# Patient Record
Sex: Male | Born: 1970
Health system: Southern US, Community
[De-identification: ages and names within clinical notes are randomized; demographics above are authoritative.]

## PROBLEM LIST (undated history)

## (undated) DIAGNOSIS — C801 Malignant (primary) neoplasm, unspecified: Secondary | ICD-10-CM

---

## 2016-06-29 DEATH — deceased

## 2016-09-08 ENCOUNTER — Telehealth: Payer: Self-pay | Admitting: Oncology

## 2016-09-08 ENCOUNTER — Encounter: Payer: Self-pay | Admitting: Oncology

## 2016-09-08 NOTE — Telephone Encounter (Signed)
Lft the pt a vm w/appt date and time to see Dr. Alen Blew on 5/22 at 11am. Will mail the pt a letter w/the appt date and time.

## 2016-09-19 ENCOUNTER — Ambulatory Visit (HOSPITAL_BASED_OUTPATIENT_CLINIC_OR_DEPARTMENT_OTHER): Payer: Self-pay | Admitting: Oncology

## 2016-09-19 ENCOUNTER — Other Ambulatory Visit: Payer: Self-pay | Admitting: *Deleted

## 2016-09-19 ENCOUNTER — Encounter: Payer: Self-pay | Admitting: *Deleted

## 2016-09-19 ENCOUNTER — Telehealth: Payer: Self-pay | Admitting: Oncology

## 2016-09-19 VITALS — BP 127/80 | HR 100 | Temp 98.0°F | Resp 18 | Ht 70.0 in | Wt 220.1 lb

## 2016-09-19 DIAGNOSIS — Z7189 Other specified counseling: Secondary | ICD-10-CM

## 2016-09-19 DIAGNOSIS — C469 Kaposi's sarcoma, unspecified: Secondary | ICD-10-CM | POA: Insufficient documentation

## 2016-09-19 DIAGNOSIS — R609 Edema, unspecified: Secondary | ICD-10-CM

## 2016-09-19 MED ORDER — PROCHLORPERAZINE MALEATE 10 MG PO TABS: 10.0000 mg | ORAL_TABLET | Freq: Four times a day (QID) | ORAL | 0 refills | Status: AC | PRN

## 2016-09-19 MED ORDER — PROCHLORPERAZINE MALEATE 10 MG PO TABS: 10.0000 mg | ORAL_TABLET | Freq: Four times a day (QID) | ORAL | 0 refills | Status: DC | PRN

## 2016-09-19 NOTE — Progress Notes (Signed)
START OFF PATHWAY REGIMEN - [Other Dx]   [id number]:Liposomal Doxorubicin (Doxil)  40 mg/m2 q28 Days:   A cycle is every 28 days:     Liposomal doxorubicin   **Always confirm dose/schedule in your pharmacy ordering system**    Intent of Therapy: Non-Curative / Palliative Intent, Discussed with Patient 

## 2016-09-19 NOTE — Telephone Encounter (Signed)
Called in compazine 10 mg # 30 to [facility] on [address]

## 2016-09-19 NOTE — Progress Notes (Signed)
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   HPI: 46 year old gentleman currently resides in [place] where he lives independently. He is a gentleman without any significant past medical history and does not seek medical attention periodically. He has been noticing a rash on his lower extremity for the last 8 months. The rash has progressed into his inner thigh, calf and subsequently to his chest. Liver. This time, he started developing lower extremity edema. He was urged by his family and friends to seek medical attention and finally did and was evaluated by his primary care physician. Based on these findings he was referred to [facility] dermatology and a biopsy obtained of his raised velvety regions on his chest and lower extremity. 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 This on these findings, he was evaluated by his primary care physician with laboratory testing in February 2018. His white cell count was 4.8, hemoglobin of 11.7 and a platelet count of 251. He is creatinine clearance was normal. Liver function tests all within normal range. His HIV testing was nonreactive and his RPR was not reactive. He was started on Lasix which have helped his symptoms at this time.  Despite these changes, he still able to drive although not able to ambulate long distances. He developed symptoms of shortness of breath and excessive fatigue after. Although embolization that is very short period his edema is still prominent and affects his quality of life. He still moves his bowel and urinate without any difficulties.  He does not report any headaches, blurry vision, syncope or seizures. He does not report any fevers, chills, sweats or weight loss. He does not report any chest pain, palpitation, orthopnea. He does not report any cough, wheezing or hemoptysis. He does not report any nausea, vomiting or abdominal pain. He does not report any change in his bowel habits. He does not report any  hematuria or dysuria. He does not report any skeletal complaints. Remaining review of systems unremarkable.   His past medical history is unremarkable for chronic medical conditions. He denied hypertension, diabetes or coronary disease. He denied any surgeries.    Current Outpatient Prescriptions:  .  furosemide (LASIX) 20 MG tablet, Take 20 mg by mouth daily., Disp: , Rfl: :  Allergies  Allergen Reactions  . Bee Venom Anaphylaxis  :  No family history of malignancy or blood disorders.   Social History   Social History  . Marital status: Single    Spouse name: N/A  . Number of children: N/A  . Years of education: N/A   Occupational History  . Not on file.   Social History Main Topics  . Smoking status: Not on file  . Smokeless tobacco: Not on file  . Alcohol use Not on file  . Drug use: Unknown  . Sexual activity: Not on file   Other Topics Concern  . Not on file   Social History Narrative  . No narrative on file  :  Pertinent items are noted in HPI.  Exam: Blood pressure 127/80, pulse 100, temperature 98 F (36.7 C), temperature source Oral, resp. rate 18, height 5' 10"  (1.778 m), weight 220 lb 1.6 oz (99.8 kg), SpO2 100 %. General appearance: alert and cooperative appeared without distress. Throat: Oral thrush or ulcers. Neck: no adenopathy Back: negative Resp: clear to auscultation bilaterally Cardio: regular rate and rhythm, S1, S2 normal, no murmur, click, rub or gallop GI: soft, non-tender; bowel sounds normal; no masses,  no organomegaly Extremities: 3+ edema to the  level of thigh.  Skin: Raised lesions noted on his chest, arms, trunk and lower action of any superior   Assessment and Plan:   46 year old gentleman with the following issues:  1. 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 He has involvement of the chest, trunk, upper extremities and lower extremities. He appears to have also lymphatic  involvement with diffuse anasarca noted on his lower extremities. He is HIV status is negative.  The natural course of disease was discussed today with the patient and his mother. He appears to have disseminated disease that will require systemic treatment rather than local therapy. Risks and benefits of Doxil chemotherapy was reviewed today. A dose of 20 mg/m will be the recommended dose every 3 weeks to control his disease locally and systemically. Complications associated with this medication include nausea, fatigue, hand-foot syndrome, anaphylaxis which is rare. Cardiac toxicity was also noted as well.   Would be to complete his staging with a CT scan chest abdomen and pelvis, attend chemotherapy education class and start therapy as soon as possible. The duration of chemotherapy would be unknown at this time depending on his benefit.  2. Cardiac monitoring: Baseline echocardiogram will be obtained in the immediate future.  3. IV access: Peripheral veins will be used for the time being. Port-A-Cath could be considered in the future.  4. Edema: This is related due to systemic involvement of Kaposi's sarcoma. Doxil should help the symptoms. I recommended continuing Lasix for the time being. This will be discontinued if his edema improved on Doxil alone.  5. Antiemetics: Prescription for Compazine will be: To the patient met this patient of start of chemotherapy.  6. Follow-up: Will be in the immediate future to start therapy as soon as possible.

## 2016-09-19 NOTE — Telephone Encounter (Signed)
Scheduled appts per 5/22 LOS - 5/29 unavailable scheduled for 5/31. Gave patient AVS and calender.

## 2016-09-20 ENCOUNTER — Encounter: Payer: Self-pay | Admitting: *Deleted

## 2016-09-20 ENCOUNTER — Other Ambulatory Visit: Payer: Self-pay

## 2016-09-26 ENCOUNTER — Encounter: Payer: Self-pay | Admitting: Oncology

## 2016-09-26 ENCOUNTER — Encounter: Payer: Self-pay | Admitting: Pharmacist

## 2016-09-26 NOTE — Progress Notes (Signed)
Called pt to introduce myself as his Arboriculturist and to discuss financial assistance.  Wasn't able to leave messages on either number that's on file.  Will try and meet pt on 09/28/16.

## 2016-09-27 ENCOUNTER — Ambulatory Visit (HOSPITAL_COMMUNITY)
Admission: RE | Admit: 2016-09-27 | Discharge: 2016-09-27 | Disposition: A | Discharge status: Home / Self Care | Payer: Self-pay | Source: Ambulatory Visit | Attending: Oncology | Admitting: Oncology

## 2016-09-27 ENCOUNTER — Encounter (HOSPITAL_COMMUNITY): Payer: Self-pay

## 2016-09-27 ENCOUNTER — Encounter: Payer: Self-pay | Admitting: Pharmacy Technician

## 2016-09-27 DIAGNOSIS — C469 Kaposi's sarcoma, unspecified: Secondary | ICD-10-CM | POA: Insufficient documentation

## 2016-09-27 DIAGNOSIS — R591 Generalized enlarged lymph nodes: Secondary | ICD-10-CM | POA: Insufficient documentation

## 2016-09-27 DIAGNOSIS — R161 Splenomegaly, not elsewhere classified: Secondary | ICD-10-CM | POA: Insufficient documentation

## 2016-09-27 DIAGNOSIS — R918 Other nonspecific abnormal finding of lung field: Secondary | ICD-10-CM | POA: Insufficient documentation

## 2016-09-27 DIAGNOSIS — R609 Edema, unspecified: Secondary | ICD-10-CM | POA: Insufficient documentation

## 2016-09-27 DIAGNOSIS — M899 Disorder of bone, unspecified: Secondary | ICD-10-CM | POA: Insufficient documentation

## 2016-09-27 DIAGNOSIS — K639 Disease of intestine, unspecified: Secondary | ICD-10-CM | POA: Insufficient documentation

## 2016-09-27 HISTORY — DX: Malignant (primary) neoplasm, unspecified: C80.1

## 2016-09-27 MED ORDER — IOPAMIDOL (ISOVUE-300) INJECTION 61%
INTRAVENOUS | Status: AC
Start: 2016-09-27 — End: 2016-09-27
  Administered 2016-09-27: 100 mL via INTRAVENOUS
  Filled 2016-09-27: qty 100

## 2016-09-27 MED ORDER — IOPAMIDOL (ISOVUE-300) INJECTION 61%
100.0000 mL | Freq: Once | INTRAVENOUS | Status: AC | PRN
  Administered 2016-09-27: 100 mL via INTRAVENOUS

## 2016-09-27 NOTE — Progress Notes (Signed)
I have attempted to request the patient to sign a drug assistance application for Doxil.  I left 2 messages on the phone numbers provided. I received no response. I contacted Lenise for more info. She left a note to see the patient before treatment on 09/28/16.

## 2016-09-28 ENCOUNTER — Other Ambulatory Visit (HOSPITAL_BASED_OUTPATIENT_CLINIC_OR_DEPARTMENT_OTHER): Payer: Self-pay

## 2016-09-28 ENCOUNTER — Encounter: Payer: Self-pay | Admitting: Oncology

## 2016-09-28 ENCOUNTER — Ambulatory Visit (HOSPITAL_BASED_OUTPATIENT_CLINIC_OR_DEPARTMENT_OTHER): Payer: Self-pay

## 2016-09-28 VITALS — BP 116/64 | HR 91 | Temp 97.7°F | Resp 18

## 2016-09-28 DIAGNOSIS — C469 Kaposi's sarcoma, unspecified: Secondary | ICD-10-CM

## 2016-09-28 DIAGNOSIS — Z5111 Encounter for antineoplastic chemotherapy: Secondary | ICD-10-CM

## 2016-09-28 LAB — COMPREHENSIVE METABOLIC PANEL
ALBUMIN: 3.2 g/dL — AB (ref 3.5–5.0)
ALK PHOS: 109 U/L (ref 40–150)
ALT: 11 U/L (ref 0–55)
ANION GAP: 7 meq/L (ref 3–11)
AST: 19 U/L (ref 5–34)
BILIRUBIN TOTAL: 0.27 mg/dL (ref 0.20–1.20)
BUN: 13.6 mg/dL (ref 7.0–26.0)
CALCIUM: 8.9 mg/dL (ref 8.4–10.4)
CO2: 22 mEq/L (ref 22–29)
CREATININE: 0.8 mg/dL (ref 0.7–1.3)
Chloride: 109 mEq/L (ref 98–109)
EGFR: >90 mL/min/{1.73_m2} (ref >90)
Glucose: 115 mg/dl (ref 70–140)
Potassium: 4.1 mEq/L (ref 3.5–5.1)
Sodium: 138 mEq/L (ref 136–145)
TOTAL PROTEIN: 7.2 g/dL (ref 6.4–8.3)

## 2016-09-28 LAB — CBC WITH DIFFERENTIAL/PLATELET
BASO%: 1.1 % (ref 0.0–2.0)
Basophils Absolute: 0 10*3/uL (ref 0.0–0.1)
EOS ABS: 0.2 10*3/uL (ref 0.0–0.5)
EOS%: 6.6 % (ref 0.0–7.0)
HEMATOCRIT: 31.2 % — AB (ref 38.4–49.9)
HGB: 10.2 g/dL — ABNORMAL LOW (ref 13.0–17.1)
LYMPH#: 1 10*3/uL (ref 0.9–3.3)
LYMPH%: 26.3 % (ref 14.0–49.0)
MCH: 26.4 pg — ABNORMAL LOW (ref 27.2–33.4)
MCHC: 32.8 g/dL (ref 32.0–36.0)
MCV: 80.5 fL (ref 79.3–98.0)
MONO#: 0.4 10*3/uL (ref 0.1–0.9)
MONO%: 11.2 % (ref 0.0–14.0)
NEUT%: 54.8 % (ref 39.0–75.0)
NEUTROS ABS: 2 10*3/uL (ref 1.5–6.5)
PLATELETS: 183 10*3/uL (ref 140–400)
RBC: 3.88 10*6/uL — ABNORMAL LOW (ref 4.20–5.82)
RDW: 16.2 % — ABNORMAL HIGH (ref 11.0–14.6)
WBC: 3.7 10*3/uL — AB (ref 4.0–10.3)

## 2016-09-28 MED ORDER — DEXAMETHASONE SODIUM PHOSPHATE 10 MG/ML IJ SOLN
10.0000 mg | Freq: Once | INTRAMUSCULAR | Status: AC
  Administered 2016-09-28: 10 mg via INTRAVENOUS

## 2016-09-28 MED ORDER — DOXORUBICIN HCL LIPOSOMAL CHEMO INJECTION 2 MG/ML
18.0000 mg/m2 | Freq: Once | INTRAVENOUS | Status: AC
  Administered 2016-09-28: 40 mg via INTRAVENOUS
  Filled 2016-09-28: qty 20

## 2016-09-28 MED ORDER — DEXTROSE 5 % IV SOLN
Freq: Once | INTRAVENOUS | Status: AC
  Administered 2016-09-28: 11:00:00 via INTRAVENOUS

## 2016-09-28 MED ORDER — DEXAMETHASONE SODIUM PHOSPHATE 10 MG/ML IJ SOLN
INTRAMUSCULAR | Status: AC
  Filled 2016-09-28: qty 1

## 2016-09-28 NOTE — Patient Instructions (Signed)
[facility] Discharge Instructions for Patients Receiving Chemotherapy  Today you received the following chemotherapy agent: Doxil.  To help prevent nausea and vomiting after your treatment, we encourage you to take your nausea medication as prescribed.   If you develop nausea and vomiting that is not controlled by your nausea medication, call the clinic.   BELOW ARE SYMPTOMS THAT SHOULD BE REPORTED IMMEDIATELY:  *FEVER GREATER THAN 100.5 F  *CHILLS WITH OR WITHOUT FEVER  NAUSEA AND VOMITING THAT IS NOT CONTROLLED WITH YOUR NAUSEA MEDICATION  *UNUSUAL SHORTNESS OF BREATH  *UNUSUAL BRUISING OR BLEEDING  TENDERNESS IN MOUTH AND THROAT WITH OR WITHOUT PRESENCE OF ULCERS  *URINARY PROBLEMS  *BOWEL PROBLEMS  UNUSUAL RASH Items with * indicate a potential emergency and should be followed up as soon as possible.  Feel free to call the clinic you have any questions or concerns. The clinic phone number is [phone].  Please show the [facility] at check-in to the Emergency Department and triage nurse.  Doxorubicin Liposomal injection (Doxil) What is this medicine? LIPOSOMAL DOXORUBICIN (LIP oh som al dox oh ROO bi sin) is a chemotherapy drug. This medicine is used to treat many kinds of cancer like Kaposi's sarcoma, multiple myeloma, and ovarian cancer. This medicine may be used for other purposes; ask your health care provider or pharmacist if you have questions. COMMON BRAND NAME(S): Doxil, Lipodox What should I tell my health care provider before I take this medicine? They need to know if you have any of these conditions: -blood disorders -heart disease -infection (especially a virus infection such as chickenpox, cold sores, or herpes) -liver disease -recent or ongoing radiation therapy -an unusual or allergic reaction to doxorubicin, other chemotherapy agents, soybeans, other medicines, foods, dyes, or preservatives -pregnant or trying to get  pregnant -breast-feeding How should I use this medicine? This drug is given as an infusion into a vein. It is administered in a hospital or clinic by a specially trained health care professional. If you have pain, swelling, burning or any unusual feeling around the site of your injection, tell your health care professional right away. Talk to your pediatrician regarding the use of this medicine in children. Special care may be needed. Overdosage: If you think you have taken too much of this medicine contact a poison control center or emergency room at once. NOTE: This medicine is only for you. Do not share this medicine with others. What if I miss a dose? It is important not to miss your dose. Call your doctor or health care professional if you are unable to keep an appointment. What may interact with this medicine? Do not take this medicine with any of the following medications: -zidovudine This medicine may also interact with the following medications: -medicines to increase blood counts like filgrastim, pegfilgrastim, sargramostim -vaccines Talk to your doctor or health care professional before taking any of these medicines: -acetaminophen -aspirin -ibuprofen -ketoprofen -naproxen This list may not describe all possible interactions. Give your health care provider a list of all the medicines, herbs, non-prescription drugs, or dietary supplements you use. Also tell them if you smoke, drink alcohol, or use illegal drugs. Some items may interact with your medicine. What should I watch for while using this medicine? Your condition will be monitored carefully while you are receiving this medicine. You will need important blood work done while you are taking this medicine. This drug may make you feel generally unwell. This is not uncommon, as chemotherapy can  affect healthy cells as well as cancer cells. Report any side effects. Continue your course of treatment even though you feel ill unless  your doctor tells you to stop. Your urine may turn orange-red for a few days after your dose. This is not blood. If your urine is dark or brown, call your doctor. In some cases, you may be given additional medicines to help with side effects. Follow all directions for their use. Call your doctor or health care professional for advice if you get a fever (100.5 degrees F or higher), chills or sore throat, or other symptoms of a cold or flu. Do not treat yourself. This drug decreases your body's ability to fight infections. Try to avoid being around people who are sick. This medicine may increase your risk to bruise or bleed. Call your doctor or health care professional if you notice any unusual bleeding. Be careful brushing and flossing your teeth or using a toothpick because you may get an infection or bleed more easily. If you have any dental work done, tell your dentist you are receiving this medicine. Avoid taking products that contain aspirin, acetaminophen, ibuprofen, naproxen, or ketoprofen unless instructed by your doctor. These medicines may hide a fever. Men and women of childbearing age should use effective birth control methods while using taking this medicine. Do not become pregnant while taking this medicine. There is a potential for serious side effects to an unborn child. Talk to your health care professional or pharmacist for more information. Do not breast-feed an infant while taking this medicine. Talk to your doctor about your risk of cancer. You may be more at risk for certain types of cancers if you take this medicine. What side effects may I notice from receiving this medicine? Side effects that you should report to your doctor or health care professional as soon as possible: -allergic reactions like skin rash, itching or hives, swelling of the face, lips, or tongue -low blood counts - this medicine may decrease the number of white blood cells, red blood cells and platelets. You may  be at increased risk for infections and bleeding. -signs of hand-foot syndrome - tingling or burning, redness, flaking, swelling, small blisters, or small sores on the palms of your hands or the soles of your feet -signs of infection - fever or chills, cough, sore throat, pain or difficulty passing urine -signs of decreased platelets or bleeding - bruising, pinpoint red spots on the skin, black, tarry stools, blood in the urine -signs of decreased red blood cells - unusually weak or tired, fainting spells, lightheadedness -back pain, chills, facial flushing, fever, headache, tightness in the chest or throat during the infusion -breathing problems -chest pain -fast, irregular heartbeat -mouth pain, redness, sores -pain, swelling, redness at site where injected -pain, tingling, numbness in the hands or feet -swelling of ankles, feet, or hands -vomiting Side effects that usually do not require medical attention (report to your doctor or health care professional if they continue or are bothersome): -diarrhea -hair loss -loss of appetite -nail discoloration or damage -nausea -red or watery eyes -red colored urine -stomach upset This list may not describe all possible side effects. Call your doctor for medical advice about side effects. You may report side effects to FDA at 1-800-FDA-1088. Where should I keep my medicine? This drug is given in a hospital or clinic and will not be stored at home. NOTE: This sheet is a summary. It may not cover all possible information. If you have questions  about this medicine, talk to your doctor, pharmacist, or health care provider.  2018 Elsevier/Gold Standard (2012-01-05 10:12:56)

## 2016-09-28 NOTE — Progress Notes (Signed)
Met w/ pt to introduce myself as his Arboriculturist and to discuss financial assistance.  Pt has not applied for Medicaid yet so I gave him an application and advised he turn it in along w/ the required docs to DSS in his county. Pt would also like to apply for a discount w/ the hospital so I gave him an application.  I will send it thru interoffice mail when he returns it along with the required docs needed for processing.  I also discussed the [place], went over what it covers and gave him an expense sheet.  He wanted to apply and since he's not working his mother provided me with a letter of support.  Pt was approved for the $400 [place].  He has my card for any questions or concerns he may have in the future.

## 2016-10-04 ENCOUNTER — Ambulatory Visit (HOSPITAL_COMMUNITY)
Admission: RE | Admit: 2016-10-04 | Discharge: 2016-10-04 | Disposition: A | Discharge status: Home / Self Care | Payer: Self-pay | Source: Ambulatory Visit | Attending: Oncology | Admitting: Oncology

## 2016-10-04 ENCOUNTER — Encounter: Payer: Self-pay | Admitting: Oncology

## 2016-10-04 DIAGNOSIS — C469 Kaposi's sarcoma, unspecified: Secondary | ICD-10-CM | POA: Insufficient documentation

## 2016-10-04 NOTE — Progress Notes (Signed)
Sent pt's financial application along w/ required docs to Johnna Acosta in customer service thru interoffice mail today.

## 2016-10-04 NOTE — Progress Notes (Signed)
  Echocardiogram 2D Echocardiogram limited has been performed.  Victor Hayden 10/04/2016, 8:56 AM

## 2016-10-20 ENCOUNTER — Telehealth: Payer: Self-pay | Admitting: Oncology

## 2016-10-20 ENCOUNTER — Ambulatory Visit (HOSPITAL_BASED_OUTPATIENT_CLINIC_OR_DEPARTMENT_OTHER): Payer: Self-pay | Admitting: Oncology

## 2016-10-20 ENCOUNTER — Other Ambulatory Visit (HOSPITAL_BASED_OUTPATIENT_CLINIC_OR_DEPARTMENT_OTHER): Payer: Self-pay

## 2016-10-20 ENCOUNTER — Ambulatory Visit (HOSPITAL_BASED_OUTPATIENT_CLINIC_OR_DEPARTMENT_OTHER): Payer: Self-pay

## 2016-10-20 VITALS — BP 120/77 | HR 93 | Temp 98.6°F | Resp 18 | Ht 70.0 in | Wt 212.8 lb

## 2016-10-20 DIAGNOSIS — Z5111 Encounter for antineoplastic chemotherapy: Secondary | ICD-10-CM

## 2016-10-20 DIAGNOSIS — C469 Kaposi's sarcoma, unspecified: Secondary | ICD-10-CM

## 2016-10-20 DIAGNOSIS — R609 Edema, unspecified: Secondary | ICD-10-CM

## 2016-10-20 LAB — CBC WITH DIFFERENTIAL/PLATELET
BASO%: 0.3 % (ref 0.0–2.0)
Basophils Absolute: 0 10*3/uL (ref 0.0–0.1)
EOS ABS: 0.3 10*3/uL (ref 0.0–0.5)
EOS%: 10.4 % — ABNORMAL HIGH (ref 0.0–7.0)
HCT: 34.6 % — ABNORMAL LOW (ref 38.4–49.9)
HEMOGLOBIN: 11.3 g/dL — AB (ref 13.0–17.1)
LYMPH%: 25.6 % (ref 14.0–49.0)
MCH: 26.6 pg — ABNORMAL LOW (ref 27.2–33.4)
MCHC: 32.7 g/dL (ref 32.0–36.0)
MCV: 81.4 fL (ref 79.3–98.0)
MONO#: 0.4 10*3/uL (ref 0.1–0.9)
MONO%: 13.1 % (ref 0.0–14.0)
NEUT%: 50.6 % (ref 39.0–75.0)
NEUTROS ABS: 1.5 10*3/uL (ref 1.5–6.5)
Platelets: 199 10*3/uL (ref 140–400)
RBC: 4.25 10*6/uL (ref 4.20–5.82)
RDW: 16.3 % — AB (ref 11.0–14.6)
WBC: 2.9 10*3/uL — AB (ref 4.0–10.3)
lymph#: 0.7 10*3/uL — ABNORMAL LOW (ref 0.9–3.3)

## 2016-10-20 LAB — COMPREHENSIVE METABOLIC PANEL
ALBUMIN: 3.7 g/dL (ref 3.5–5.0)
ALK PHOS: 120 U/L (ref 40–150)
ALT: 18 U/L (ref 0–55)
AST: 21 U/L (ref 5–34)
Anion Gap: 11 mEq/L (ref 3–11)
BUN: 13.9 mg/dL (ref 7.0–26.0)
CO2: 23 mEq/L (ref 22–29)
Calcium: 9.8 mg/dL (ref 8.4–10.4)
Chloride: 104 mEq/L (ref 98–109)
Creatinine: 0.9 mg/dL (ref 0.7–1.3)
EGFR: >90 mL/min/{1.73_m2} (ref >90)
GLUCOSE: 88 mg/dL (ref 70–140)
Potassium: 3.8 mEq/L (ref 3.5–5.1)
SODIUM: 137 meq/L (ref 136–145)
Total Bilirubin: 0.3 mg/dL (ref 0.20–1.20)
Total Protein: 8.6 g/dL — ABNORMAL HIGH (ref 6.4–8.3)

## 2016-10-20 MED ORDER — DOXORUBICIN HCL LIPOSOMAL CHEMO INJECTION 2 MG/ML
18.0000 mg/m2 | INJECTION | Freq: Once | INTRAVENOUS | Status: AC
Start: 2016-10-20 — End: 2016-10-20
  Administered 2016-10-20: 40 mg via INTRAVENOUS
  Filled 2016-10-20: qty 20

## 2016-10-20 MED ORDER — DOXORUBICIN HCL LIPOSOMAL CHEMO INJECTION 2 MG/ML
18.0000 mg/m2 | Freq: Once | INTRAVENOUS | Status: DC
  Filled 2016-10-20: qty 20

## 2016-10-20 MED ORDER — DEXAMETHASONE SODIUM PHOSPHATE 10 MG/ML IJ SOLN
10.0000 mg | Freq: Once | INTRAMUSCULAR | Status: AC
  Administered 2016-10-20: 10 mg via INTRAVENOUS

## 2016-10-20 MED ORDER — DEXAMETHASONE SODIUM PHOSPHATE 10 MG/ML IJ SOLN
INTRAMUSCULAR | Status: AC
  Filled 2016-10-20: qty 1

## 2016-10-20 MED ORDER — DEXTROSE 5 % IV SOLN
Freq: Once | INTRAVENOUS | Status: AC
  Administered 2016-10-20: 11:00:00 via INTRAVENOUS

## 2016-10-20 NOTE — Patient Instructions (Signed)
[facility] Discharge Instructions for Patients Receiving Chemotherapy  Today you received the following chemotherapy agent: Doxil.  To help prevent nausea and vomiting after your treatment, we encourage you to take your nausea medication as prescribed.   If you develop nausea and vomiting that is not controlled by your nausea medication, call the clinic.   BELOW ARE SYMPTOMS THAT SHOULD BE REPORTED IMMEDIATELY:  *FEVER GREATER THAN 100.5 F  *CHILLS WITH OR WITHOUT FEVER  NAUSEA AND VOMITING THAT IS NOT CONTROLLED WITH YOUR NAUSEA MEDICATION  *UNUSUAL SHORTNESS OF BREATH  *UNUSUAL BRUISING OR BLEEDING  TENDERNESS IN MOUTH AND THROAT WITH OR WITHOUT PRESENCE OF ULCERS  *URINARY PROBLEMS  *BOWEL PROBLEMS  UNUSUAL RASH Items with * indicate a potential emergency and should be followed up as soon as possible.  Feel free to call the clinic you have any questions or concerns. The clinic phone number is [phone].  Please show the [facility] at check-in to the Emergency Department and triage nurse.  Doxorubicin Liposomal injection (Doxil) What is this medicine? LIPOSOMAL DOXORUBICIN (LIP oh som al dox oh ROO bi sin) is a chemotherapy drug. This medicine is used to treat many kinds of cancer like Kaposi's sarcoma, multiple myeloma, and ovarian cancer. This medicine may be used for other purposes; ask your health care provider or pharmacist if you have questions. COMMON BRAND NAME(S): Doxil, Lipodox What should I tell my health care provider before I take this medicine? They need to know if you have any of these conditions: -blood disorders -heart disease -infection (especially a virus infection such as chickenpox, cold sores, or herpes) -liver disease -recent or ongoing radiation therapy -an unusual or allergic reaction to doxorubicin, other chemotherapy agents, soybeans, other medicines, foods, dyes, or preservatives -pregnant or trying to get  pregnant -breast-feeding How should I use this medicine? This drug is given as an infusion into a vein. It is administered in a hospital or clinic by a specially trained health care professional. If you have pain, swelling, burning or any unusual feeling around the site of your injection, tell your health care professional right away. Talk to your pediatrician regarding the use of this medicine in children. Special care may be needed. Overdosage: If you think you have taken too much of this medicine contact a poison control center or emergency room at once. NOTE: This medicine is only for you. Do not share this medicine with others. What if I miss a dose? It is important not to miss your dose. Call your doctor or health care professional if you are unable to keep an appointment. What may interact with this medicine? Do not take this medicine with any of the following medications: -zidovudine This medicine may also interact with the following medications: -medicines to increase blood counts like filgrastim, pegfilgrastim, sargramostim -vaccines Talk to your doctor or health care professional before taking any of these medicines: -acetaminophen -aspirin -ibuprofen -ketoprofen -naproxen This list may not describe all possible interactions. Give your health care provider a list of all the medicines, herbs, non-prescription drugs, or dietary supplements you use. Also tell them if you smoke, drink alcohol, or use illegal drugs. Some items may interact with your medicine. What should I watch for while using this medicine? Your condition will be monitored carefully while you are receiving this medicine. You will need important blood work done while you are taking this medicine. This drug may make you feel generally unwell. This is not uncommon, as chemotherapy can  affect healthy cells as well as cancer cells. Report any side effects. Continue your course of treatment even though you feel ill unless  your doctor tells you to stop. Your urine may turn orange-red for a few days after your dose. This is not blood. If your urine is dark or brown, call your doctor. In some cases, you may be given additional medicines to help with side effects. Follow all directions for their use. Call your doctor or health care professional for advice if you get a fever (100.5 degrees F or higher), chills or sore throat, or other symptoms of a cold or flu. Do not treat yourself. This drug decreases your body's ability to fight infections. Try to avoid being around people who are sick. This medicine may increase your risk to bruise or bleed. Call your doctor or health care professional if you notice any unusual bleeding. Be careful brushing and flossing your teeth or using a toothpick because you may get an infection or bleed more easily. If you have any dental work done, tell your dentist you are receiving this medicine. Avoid taking products that contain aspirin, acetaminophen, ibuprofen, naproxen, or ketoprofen unless instructed by your doctor. These medicines may hide a fever. Men and women of childbearing age should use effective birth control methods while using taking this medicine. Do not become pregnant while taking this medicine. There is a potential for serious side effects to an unborn child. Talk to your health care professional or pharmacist for more information. Do not breast-feed an infant while taking this medicine. Talk to your doctor about your risk of cancer. You may be more at risk for certain types of cancers if you take this medicine. What side effects may I notice from receiving this medicine? Side effects that you should report to your doctor or health care professional as soon as possible: -allergic reactions like skin rash, itching or hives, swelling of the face, lips, or tongue -low blood counts - this medicine may decrease the number of white blood cells, red blood cells and platelets. You may  be at increased risk for infections and bleeding. -signs of hand-foot syndrome - tingling or burning, redness, flaking, swelling, small blisters, or small sores on the palms of your hands or the soles of your feet -signs of infection - fever or chills, cough, sore throat, pain or difficulty passing urine -signs of decreased platelets or bleeding - bruising, pinpoint red spots on the skin, black, tarry stools, blood in the urine -signs of decreased red blood cells - unusually weak or tired, fainting spells, lightheadedness -back pain, chills, facial flushing, fever, headache, tightness in the chest or throat during the infusion -breathing problems -chest pain -fast, irregular heartbeat -mouth pain, redness, sores -pain, swelling, redness at site where injected -pain, tingling, numbness in the hands or feet -swelling of ankles, feet, or hands -vomiting Side effects that usually do not require medical attention (report to your doctor or health care professional if they continue or are bothersome): -diarrhea -hair loss -loss of appetite -nail discoloration or damage -nausea -red or watery eyes -red colored urine -stomach upset This list may not describe all possible side effects. Call your doctor for medical advice about side effects. You may report side effects to FDA at 1-800-FDA-1088. Where should I keep my medicine? This drug is given in a hospital or clinic and will not be stored at home. NOTE: This sheet is a summary. It may not cover all possible information. If you have questions  about this medicine, talk to your doctor, pharmacist, or health care provider.  2018 Elsevier/Gold Standard (2012-01-05 10:12:56)    

## 2016-10-20 NOTE — Telephone Encounter (Signed)
Scheduled appt per 6/22 los - Gave patient AVS and calender per los.  

## 2016-10-20 NOTE — Progress Notes (Signed)
Hematology and Oncology Follow Up Visit  Victor Hayden [id number] 24-Aug-1970 46 y.o. 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 He has also possible disease in the lung in the gut. He is HIV negative.   Current therapy: He is currently on Doxil 40 mg total dose every 4 weeks. His first treatment was on 09/28/2016.  Interim History: Victor Hayden presents today for a follow-up visit. Since the last visit, he received his first dose of Doxil and tolerated it well. He denied any infusion related complications, nausea or vomiting. He did report some mild fatigue but otherwise no other complaints. He denied any hand-foot syndrome or any skin toxicities. He is noticing excellent response to therapy with improvement in his disseminated rash and decrease in the erythematous part of his rash. He is noticing more scaly and less pronounced. His edema is also improving.  He denied any GI complaints. He denied any diarrhea or abdominal pain. He denied any vomiting. He denied any hemoptysis or or dyspnea on exertion. He is ambulating without any major difficulties at this time.  He does not report any headaches, blurry vision, syncope or seizures. He does not report any fevers, chills, sweats or weight loss. He does not report any chest pain, palpitation, orthopnea. He does not report any cough, wheezing or hemoptysis. He does not report any nausea, vomiting or abdominal pain. He does not report any change in his bowel habits. He does not report any hematuria or dysuria. He does not report any skeletal complaints. Remaining review of systems unremarkable  Medications: I have reviewed the patient's current medications.  Current Outpatient Prescriptions   Medication Sig Dispense Refill  . furosemide (LASIX) 20 MG tablet Take 20 mg by mouth daily.    . prochlorperazine (COMPAZINE) 10 MG tablet Take 1 tablet (10 mg total) by mouth every 6 (six) hours as needed for nausea or vomiting. 30 tablet 0   No current facility-administered medications for this visit.      Allergies:  Allergies  Allergen Reactions  . Bee Venom Anaphylaxis    Past Medical History, Surgical history, Social history, and Family History were reviewed and updated.  Physical Exam: Blood pressure 120/77, pulse 93, temperature 98.6 F (37 C), temperature source Oral, resp. rate 18, height 5\' 10"  (1.778 m), weight 212 lb 12.8 oz (96.5 kg), SpO2 100 %. ECOG: 1 General appearance: alert and cooperative appeared without distress. Head: Normocephalic, without obvious abnormality no oral ulcers or lesions. Neck: no adenopathy Lymph nodes: Cervical, supraclavicular, and axillary nodes normal. Heart:regular rate and rhythm, S1, S2 normal, no murmur, click, rub or gallop Lung:chest clear, no wheezing, rales, normal symmetric air entry Abdomin: soft, non-tender, without masses or organomegaly no shifting dullness or ascites. EXT: 2+ edema noted which has improved from previous examination. Skin: Multiple angiomatous nodules and plaques noted on his upper and lower extremities. They are less pronounced and appears to be more scarred especially in the interior of his thighs.  Lab Results: Lab Results  Component Value Date   WBC 2.9 (L) 10/20/2016   HGB 11.3 (L) 10/20/2016   HCT 34.6 (L) 10/20/2016   MCV 81.4 10/20/2016   PLT 199 10/20/2016     Chemistry      Component Value Date/Time   NA 138 09/28/2016 0951  K 4.1 09/28/2016 0951   CO2 22 09/28/2016 0951   BUN 13.6 09/28/2016 0951   CREATININE 0.8 09/28/2016 0951      Component Value Date/Time   CALCIUM 8.9 09/28/2016 0951   ALKPHOS 109 09/28/2016 0951   AST 19 09/28/2016 0951   ALT 11 09/28/2016 0951   BILITOT  0.27 09/28/2016 0951       Radiological Studies:  EXAM: CT CHEST, ABDOMEN, AND PELVIS WITH CONTRAST  TECHNIQUE: Multidetector CT imaging of the chest, abdomen and pelvis was performed following the standard protocol during bolus administration of intravenous contrast.  CONTRAST:  100 cc Isovue 300  COMPARISON:  None.  FINDINGS: CT CHEST FINDINGS  Cardiovascular: Unremarkable  Mediastinum/Nodes: Scattered small supraclavicular lymph nodes.  Scattered primarily small mediastinal lymph nodes including for example a 0.7 cm rope right upper paratracheal node on image 18/2 and a 0.9 cm right hilar lymph node on image 29/2.  Shotty bilateral axillary and subpectoral adenopathy with stranding around the lymph nodes. A left axillary node measures 0.8 cm on image 26/2.  Small type 1 hiatal hernia.  Lungs/Pleura: Mild airway thickening is observed, most notable in the right upper lobe, with some degree of peribronchovascular nodularity manifesting as small peribronchovascular nodules. An index right lower lobe nodule measures 0.5 by 0.7 cm on images 104-105 series 6. Other smaller scattered nodules are present bilaterally.  Musculoskeletal: 1.9 by 1.2 by 0.9 cm primarily lucent lesion in the left posterior T9 vertebra, probably a small hemangioma technically nonspecific.  CT ABDOMEN PELVIS FINDINGS  Hepatobiliary: 5 mm nonspecific hypodense lesion in the right hepatic lobe on image 13/7, probably a small cyst given the low-density on delayed images. Gallbladder unremarkable. No biliary dilatation.  Pancreas: Unremarkable  Spleen: The spleen measures 15.6 by 10.3 by 6.7 cm (volume = 560 cm^3). No focal lesion identified.  Adrenals/Urinary Tract: Unremarkable  Stomach/Bowel: Mildly distended appendix, portions measure up to 9 mm, but containing gas and contrast in a fashion that makes me doubt acute appendicitis. Suspected thick walled loops of  distal duodenum and proximal jejunum.  Vascular/Lymphatic: 1.4 cm left periaortic lymph node, image 72/2. Numerous small retroperitoneal lymph nodes are present. Right external iliac node 1.3 cm on image 100/2 with surrounding stranding. Left external iliac node 1.3 cm on image 108/2. Left external iliac node 1.3 cm on image 111/2. There is soft tissue density encasement of the external iliac and common femoral vasculature extending down to the superficial femoral arteries. Stranding is present tracking along the external iliac vessels more cephalad.  Reproductive: Subcutaneous edema in the anterior and lateral upper thighs, pubis, and anterior perineum extends down into the scrotum and possibly the penis.  Other: The considerable subcutaneous edema in the lower pelvis, overlying the lateral hips, and extending into the groin and anterior upper thighs tracks along superficial fascia all musculature and is somewhat confluent with the vascular encasement in the upper thigh region. Cutaneous thickening is present anteriorly in both upper thighs. There is a lesser degree of stranding extending superficial to the lateral abdominal wall musculature, for example image 78/2. Trace presacral edema.  Musculoskeletal: Small lucencies along the posterosuperior margin of the L5 vertebra, uncertain significance. Mild degenerative disc disease at L3-4 and L4-5, potentially contributing to mild bilateral foraminal impingement at L4-5.  Other small lucencies are present in the bony pelvis, including an oval-shaped 1.4 by 0.7 cm lucency in in the right iliac bone on image 101/2, and a small lucency posteriorly in the S1 vertebra on  image 85/5.  IMPRESSION: 1. Abnormal adenopathy in the chest, abdomen, and pelvis, with numerous scattered small lymph nodes in the chest with surrounding mild stranding ; retroperitoneal and especially at lower pelvic adenopathy with surrounding stranding; and  vascular encasement by potential nodal tissue along the external iliac and common femoral vasculature. This could well be a manifestation of Kaposi sarcoma. 2. There is extensive subcutaneous edema along the pubis, lateral to the hips, in the upper thigh region, in the anterior perineum, and in the scrotum. This could be from lymphatic obstruction although cutaneous Kaposi sarcoma directly contributing to this appearance is certainly possible. 3. Peribronchovascular nodularity in the lungs. This has been described as a potential manifestation of Kaposi sarcoma. Atypical infectious processes a differential diagnostic consideration. 4. Abnormal proximal small bowel wall thickening, involving the distal duodenum and proximal jejunum. Visceral involvement by Kaposi sarcoma is a possibility. Differential diagnostic considerations may include lymphoma, antritis, and Crohn's disease. Endoscopy can be useful in differentiating Kaposi sarcoma from other etiologies. 5. Distended appendix with portions measuring up to 9 mm in diameter, but containing gas and contrast in a fashion such that the walls are not overtly thickened and I am skeptical of appendicitis. 6. Mild splenomegaly, splenic volume 560 cc, without focal splenic lesion observed. 7. There is some scattered lucencies in the skeleton, including the T9 vertebral lesion and smaller lesions in the L5 vertebra and right sacrum as well as at S1. Etiology is uncertain, hemangiomas can sometimes cause this appearance, although Kaposi sarcoma can involve the bone.   Impression and Plan:  46 year old gentleman with the following issues:  1. 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 He has involvement of the chest, trunk, upper extremities and lower extremities. He appears to have also lymphatic involvement with diffuse anasarca noted on his lower extremities. He is HIV status is negative.  CT  scan for staging purposes completed on 09/27/2016 was reviewed today and showed a disseminated disease. He has lymph node involvement and possibly GI and pulmonary Kaposi's involvement.  He is currently on Doxil and received the first treatment without complications on 78/29/5621.  The natural course of this disease was reviewed again with the patient. He has rather advanced presentation but has responded reasonably well so far to the first treatment of Doxil. The plan is to continue with the same dose and schedule on a monthly basis as long as he is responding. The duration of treatment is undetermined at this time. He might require long-term treatment given the extent of his disease.   2. Cardiac monitoring: Baseline echocardiogram obtain 10/04/2016 showed a normal EF.  3. IV access: Peripheral veins will be used for the time being. Port-A-Cath could be considered in the future.  4. Edema: This is related due to systemic involvement of Kaposi's sarcoma. His symptoms has improved at this time. He is currently on Lasix which will be discontinued in the future.  5. Antiemetics: Prescription for Compazine is available to him at this time. No nausea or vomiting noted.  6. Follow-up: In 4 weeks for the next cycle of chemotherapy.  [facility], MD 6/22/201810:16 AM

## 2016-11-17 ENCOUNTER — Telehealth: Payer: Self-pay | Admitting: Oncology

## 2016-11-17 ENCOUNTER — Other Ambulatory Visit (HOSPITAL_BASED_OUTPATIENT_CLINIC_OR_DEPARTMENT_OTHER): Payer: Self-pay

## 2016-11-17 ENCOUNTER — Ambulatory Visit (HOSPITAL_BASED_OUTPATIENT_CLINIC_OR_DEPARTMENT_OTHER): Payer: Self-pay

## 2016-11-17 ENCOUNTER — Ambulatory Visit (HOSPITAL_BASED_OUTPATIENT_CLINIC_OR_DEPARTMENT_OTHER): Payer: Self-pay | Admitting: Oncology

## 2016-11-17 VITALS — BP 122/83 | HR 104 | Temp 98.2°F | Resp 18 | Ht 70.0 in | Wt 213.8 lb

## 2016-11-17 DIAGNOSIS — C469 Kaposi's sarcoma, unspecified: Secondary | ICD-10-CM

## 2016-11-17 DIAGNOSIS — Z5112 Encounter for antineoplastic immunotherapy: Secondary | ICD-10-CM

## 2016-11-17 DIAGNOSIS — K1231 Oral mucositis (ulcerative) due to antineoplastic therapy: Secondary | ICD-10-CM

## 2016-11-17 DIAGNOSIS — R609 Edema, unspecified: Secondary | ICD-10-CM

## 2016-11-17 LAB — COMPREHENSIVE METABOLIC PANEL
ALBUMIN: 3.6 g/dL (ref 3.5–5.0)
ALK PHOS: 124 U/L (ref 40–150)
ALT: 19 U/L (ref 0–55)
ANION GAP: 11 meq/L (ref 3–11)
AST: 20 U/L (ref 5–34)
BUN: 20.3 mg/dL (ref 7.0–26.0)
CALCIUM: 9.5 mg/dL (ref 8.4–10.4)
CHLORIDE: 103 meq/L (ref 98–109)
CO2: 24 mEq/L (ref 22–29)
Creatinine: 1.1 mg/dL (ref 0.7–1.3)
EGFR: 79 mL/min/{1.73_m2} — AB (ref >90)
Glucose: 94 mg/dl (ref 70–140)
POTASSIUM: 3.8 meq/L (ref 3.5–5.1)
Sodium: 138 mEq/L (ref 136–145)
Total Bilirubin: 0.31 mg/dL (ref 0.20–1.20)
Total Protein: 8.2 g/dL (ref 6.4–8.3)

## 2016-11-17 LAB — CBC WITH DIFFERENTIAL/PLATELET
BASO%: 0.3 % (ref 0.0–2.0)
BASOS ABS: 0 10*3/uL (ref 0.0–0.1)
EOS ABS: 0.2 10*3/uL (ref 0.0–0.5)
EOS%: 5.6 % (ref 0.0–7.0)
HEMATOCRIT: 36.1 % — AB (ref 38.4–49.9)
HEMOGLOBIN: 11.9 g/dL — AB (ref 13.0–17.1)
LYMPH#: 0.7 10*3/uL — AB (ref 0.9–3.3)
LYMPH%: 18.9 % (ref 14.0–49.0)
MCH: 26.7 pg — ABNORMAL LOW (ref 27.2–33.4)
MCHC: 33 g/dL (ref 32.0–36.0)
MCV: 80.9 fL (ref 79.3–98.0)
MONO#: 0.8 10*3/uL (ref 0.1–0.9)
MONO%: 19.4 % — ABNORMAL HIGH (ref 0.0–14.0)
NEUT#: 2.2 10*3/uL (ref 1.5–6.5)
NEUT%: 55.8 % (ref 39.0–75.0)
PLATELETS: 177 10*3/uL (ref 140–400)
RBC: 4.46 10*6/uL (ref 4.20–5.82)
RDW: 16.5 % — ABNORMAL HIGH (ref 11.0–14.6)
WBC: 3.9 10*3/uL — ABNORMAL LOW (ref 4.0–10.3)

## 2016-11-17 MED ORDER — MAGIC MOUTHWASH W/LIDOCAINE: 5.0000 mL | Freq: Four times a day (QID) | ORAL | 3 refills | Status: AC | PRN

## 2016-11-17 MED ORDER — DEXAMETHASONE SODIUM PHOSPHATE 10 MG/ML IJ SOLN
INTRAMUSCULAR | Status: AC
  Filled 2016-11-17: qty 1

## 2016-11-17 MED ORDER — DEXAMETHASONE SODIUM PHOSPHATE 10 MG/ML IJ SOLN
10.0000 mg | Freq: Once | INTRAMUSCULAR | Status: AC
Start: 2016-11-17 — End: 2016-11-17
  Administered 2016-11-17: 10 mg via INTRAVENOUS

## 2016-11-17 MED ORDER — DOXORUBICIN HCL LIPOSOMAL CHEMO INJECTION 2 MG/ML
18.0000 mg/m2 | Freq: Once | INTRAVENOUS | Status: AC
  Administered 2016-11-17: 40 mg via INTRAVENOUS
  Filled 2016-11-17: qty 20

## 2016-11-17 MED ORDER — DEXTROSE 5 % IV SOLN
Freq: Once | INTRAVENOUS | Status: AC
  Administered 2016-11-17: 10:00:00 via INTRAVENOUS

## 2016-11-17 MED FILL — MAGIC MOUTHWASH W/LIDO 1:1: 12 days supply | Qty: 240 | Fill #0

## 2016-11-17 NOTE — Progress Notes (Signed)
Hematology and Oncology Follow Up Visit  Joedy Eickhoff [place]oston [id number] 1971-03-28 46 y.o. 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 He presented with disseminated disease with multiple nodules and plaques in his upper and lower extremities, trunk as well as lymphatic involvement. He has also possible disease in the lung in the gut. He is HIV negative.   Current therapy: He is currently on Doxil 40 mg total dose every 4 weeks. His first treatment was on 09/28/2016.  Interim History: Mr. Halsted presents today for a follow-up visit. Since the last visit, he continues to tolerated Doxil without major complications. He is experiencing some mild mucositis and has been manageable. He denied any nausea, vomiting or abdominal pain. He does report some nail changes.  He reported considerable improvement in his generalized anasarca as well as skin lesions. He reports that all of these lesions are more crusting at this time. He continues to perform activities of daily living including been active in the yard.   He does not report any headaches, blurry vision, syncope or seizures. He does not report any fevers, chills, sweats or weight loss. He does not report any chest pain, palpitation, orthopnea. He does not report any cough, wheezing or hemoptysis. He does not report any nausea, vomiting or abdominal pain. He does not report any change in his bowel habits. He does not report any hematuria or dysuria. He does not report any skeletal complaints. Remaining review of systems unremarkable  Medications: I have reviewed the patient's current medications.  Current Outpatient Prescriptions  Medication Sig Dispense Refill  . furosemide (LASIX) 20 MG tablet Take 20 mg by mouth daily.    . prochlorperazine (COMPAZINE) 10 MG tablet Take 1 tablet (10 mg total) by mouth every 6 (six) hours as needed for  nausea or vomiting. 30 tablet 0  . magic mouthwash w/lidocaine SOLN Take 5 mLs by mouth 4 (four) times daily as needed for mouth pain. 240 mL 3   No current facility-administered medications for this visit.      Allergies:  Allergies  Allergen Reactions  . Bee Venom Anaphylaxis    Past Medical History, Surgical history, Social history, and Family History were reviewed and updated.  Physical Exam: Blood pressure 122/83, pulse (!) 104, temperature 98.2 F (36.8 C), temperature source Oral, resp. rate 18, height 5\' 10"  (1.778 m), weight 213 lb 12.8 oz (97 kg), SpO2 100 %. ECOG: 1 General appearance: Well-appearing gentleman without distress. Head: Normocephalic, without obvious abnormality no oral ulcers or lesions. Neck: no adenopathy Lymph nodes: Cervical, supraclavicular, and axillary nodes normal. Heart:regular rate and rhythm, S1, S2 normal, no murmur, click, rub or gallop Lung:chest clear, no wheezing, rales, normal symmetric air entry Abdomin: soft, non-tender, without masses or organomegaly no shifting dullness or ascites. EXT: 2+ edema noted which has improved from previous examination. Skin: Multiple angiomatous nodules and plaques noted on his upper and lower extremities. Appears to be less raised and normal liver red appearing. More crusted appearance noted.   Lab Results: Lab Results  Component Value Date   WBC 3.9 (L) 11/17/2016   HGB 11.9 (L) 11/17/2016   HCT 36.1 (L) 11/17/2016   MCV 80.9 11/17/2016   PLT 177 11/17/2016     Chemistry      Component Value Date/Time   NA 137 10/20/2016 0932   K 3.8 10/20/2016 0932   CO2 23 10/20/2016 0932   BUN 13.9 10/20/2016  0932   CREATININE 0.9 10/20/2016 0932      Component Value Date/Time   CALCIUM 9.8 10/20/2016 0932   ALKPHOS 120 10/20/2016 0932   AST 21 10/20/2016 0932   ALT 18 10/20/2016 0932   BILITOT 0.30 10/20/2016 0932       Impression and Plan:  46 year old gentleman with the following  issues:  1. 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 He has involvement of the chest, trunk, upper extremities and lower extremities. He appears to have also lymphatic involvement with diffuse anasarca noted on his lower extremities. He is HIV status is negative.  CT scan for staging purposes completed on 09/27/2016 was reviewed today and showed a disseminated disease. He has lymph node involvement and possibly GI and pulmonary Kaposi's involvement.  He is currently on Doxil Started on 09/28/2016. He continues to tolerated this treatment well with excellent clinical response.  The plan is to continue with the same dose and schedule and repeat imaging studies in 2 months.   2. Cardiac monitoring: Baseline echocardiogram obtain 10/04/2016 showed a normal EF.  3. IV access: Peripheral veins will be used for the time being. Port-A-Cath could be considered in the future.  4. Edema: This is related due to systemic involvement of Kaposi's sarcoma. His symptoms has improved at this time. He is currently on Lasix which will be discontinued in the future.  5. Antiemetics: Prescription for Compazine is available to him at this time. No nausea or vomiting noted.  6. Mucositis: Very mild at this time. We will have that magic mouthwash available for him.  7. Follow-up: In 4 weeks for the next cycle of chemotherapy.  [facility], MD 7/20/20189:09 AM

## 2016-11-17 NOTE — Patient Instructions (Signed)
[facility] Discharge Instructions for Patients Receiving Chemotherapy  Today you received the following chemotherapy agent: Doxil.  To help prevent nausea and vomiting after your treatment, we encourage you to take your nausea medication as prescribed.   If you develop nausea and vomiting that is not controlled by your nausea medication, call the clinic.   BELOW ARE SYMPTOMS THAT SHOULD BE REPORTED IMMEDIATELY:  *FEVER GREATER THAN 100.5 F  *CHILLS WITH OR WITHOUT FEVER  NAUSEA AND VOMITING THAT IS NOT CONTROLLED WITH YOUR NAUSEA MEDICATION  *UNUSUAL SHORTNESS OF BREATH  *UNUSUAL BRUISING OR BLEEDING  TENDERNESS IN MOUTH AND THROAT WITH OR WITHOUT PRESENCE OF ULCERS  *URINARY PROBLEMS  *BOWEL PROBLEMS  UNUSUAL RASH Items with * indicate a potential emergency and should be followed up as soon as possible.  Feel free to call the clinic you have any questions or concerns. The clinic phone number is [phone].  Please show the [facility] at check-in to the Emergency Department and triage nurse.  Doxorubicin Liposomal injection (Doxil) What is this medicine? LIPOSOMAL DOXORUBICIN (LIP oh som al dox oh ROO bi sin) is a chemotherapy drug. This medicine is used to treat many kinds of cancer like Kaposi's sarcoma, multiple myeloma, and ovarian cancer. This medicine may be used for other purposes; ask your health care provider or pharmacist if you have questions. COMMON BRAND NAME(S): Doxil, Lipodox What should I tell my health care provider before I take this medicine? They need to know if you have any of these conditions: -blood disorders -heart disease -infection (especially a virus infection such as chickenpox, cold sores, or herpes) -liver disease -recent or ongoing radiation therapy -an unusual or allergic reaction to doxorubicin, other chemotherapy agents, soybeans, other medicines, foods, dyes, or preservatives -pregnant or trying to get  pregnant -breast-feeding How should I use this medicine? This drug is given as an infusion into a vein. It is administered in a hospital or clinic by a specially trained health care professional. If you have pain, swelling, burning or any unusual feeling around the site of your injection, tell your health care professional right away. Talk to your pediatrician regarding the use of this medicine in children. Special care may be needed. Overdosage: If you think you have taken too much of this medicine contact a poison control center or emergency room at once. NOTE: This medicine is only for you. Do not share this medicine with others. What if I miss a dose? It is important not to miss your dose. Call your doctor or health care professional if you are unable to keep an appointment. What may interact with this medicine? Do not take this medicine with any of the following medications: -zidovudine This medicine may also interact with the following medications: -medicines to increase blood counts like filgrastim, pegfilgrastim, sargramostim -vaccines Talk to your doctor or health care professional before taking any of these medicines: -acetaminophen -aspirin -ibuprofen -ketoprofen -naproxen This list may not describe all possible interactions. Give your health care provider a list of all the medicines, herbs, non-prescription drugs, or dietary supplements you use. Also tell them if you smoke, drink alcohol, or use illegal drugs. Some items may interact with your medicine. What should I watch for while using this medicine? Your condition will be monitored carefully while you are receiving this medicine. You will need important blood work done while you are taking this medicine. This drug may make you feel generally unwell. This is not uncommon, as chemotherapy can  affect healthy cells as well as cancer cells. Report any side effects. Continue your course of treatment even though you feel ill unless  your doctor tells you to stop. Your urine may turn orange-red for a few days after your dose. This is not blood. If your urine is dark or brown, call your doctor. In some cases, you may be given additional medicines to help with side effects. Follow all directions for their use. Call your doctor or health care professional for advice if you get a fever (100.5 degrees F or higher), chills or sore throat, or other symptoms of a cold or flu. Do not treat yourself. This drug decreases your body's ability to fight infections. Try to avoid being around people who are sick. This medicine may increase your risk to bruise or bleed. Call your doctor or health care professional if you notice any unusual bleeding. Be careful brushing and flossing your teeth or using a toothpick because you may get an infection or bleed more easily. If you have any dental work done, tell your dentist you are receiving this medicine. Avoid taking products that contain aspirin, acetaminophen, ibuprofen, naproxen, or ketoprofen unless instructed by your doctor. These medicines may hide a fever. Men and women of childbearing age should use effective birth control methods while using taking this medicine. Do not become pregnant while taking this medicine. There is a potential for serious side effects to an unborn child. Talk to your health care professional or pharmacist for more information. Do not breast-feed an infant while taking this medicine. Talk to your doctor about your risk of cancer. You may be more at risk for certain types of cancers if you take this medicine. What side effects may I notice from receiving this medicine? Side effects that you should report to your doctor or health care professional as soon as possible: -allergic reactions like skin rash, itching or hives, swelling of the face, lips, or tongue -low blood counts - this medicine may decrease the number of white blood cells, red blood cells and platelets. You may  be at increased risk for infections and bleeding. -signs of hand-foot syndrome - tingling or burning, redness, flaking, swelling, small blisters, or small sores on the palms of your hands or the soles of your feet -signs of infection - fever or chills, cough, sore throat, pain or difficulty passing urine -signs of decreased platelets or bleeding - bruising, pinpoint red spots on the skin, black, tarry stools, blood in the urine -signs of decreased red blood cells - unusually weak or tired, fainting spells, lightheadedness -back pain, chills, facial flushing, fever, headache, tightness in the chest or throat during the infusion -breathing problems -chest pain -fast, irregular heartbeat -mouth pain, redness, sores -pain, swelling, redness at site where injected -pain, tingling, numbness in the hands or feet -swelling of ankles, feet, or hands -vomiting Side effects that usually do not require medical attention (report to your doctor or health care professional if they continue or are bothersome): -diarrhea -hair loss -loss of appetite -nail discoloration or damage -nausea -red or watery eyes -red colored urine -stomach upset This list may not describe all possible side effects. Call your doctor for medical advice about side effects. You may report side effects to FDA at 1-800-FDA-1088. Where should I keep my medicine? This drug is given in a hospital or clinic and will not be stored at home. NOTE: This sheet is a summary. It may not cover all possible information. If you have questions  about this medicine, talk to your doctor, pharmacist, or health care provider.  2018 Elsevier/Gold Standard (2012-01-05 10:12:56)    

## 2016-11-17 NOTE — Telephone Encounter (Signed)
Appointments complete per 7/20 los. Patient will get print out in infusion. Per patient request moved 8/17 appointments for 8/15.

## 2016-12-13 ENCOUNTER — Ambulatory Visit (HOSPITAL_BASED_OUTPATIENT_CLINIC_OR_DEPARTMENT_OTHER): Payer: Self-pay

## 2016-12-13 ENCOUNTER — Ambulatory Visit (HOSPITAL_BASED_OUTPATIENT_CLINIC_OR_DEPARTMENT_OTHER): Payer: Self-pay | Admitting: Oncology

## 2016-12-13 ENCOUNTER — Telehealth: Payer: Self-pay

## 2016-12-13 ENCOUNTER — Other Ambulatory Visit (HOSPITAL_BASED_OUTPATIENT_CLINIC_OR_DEPARTMENT_OTHER): Payer: Self-pay

## 2016-12-13 VITALS — BP 119/74 | HR 94 | Temp 98.5°F | Resp 18 | Ht 70.0 in | Wt 215.3 lb

## 2016-12-13 DIAGNOSIS — C469 Kaposi's sarcoma, unspecified: Secondary | ICD-10-CM

## 2016-12-13 DIAGNOSIS — Z5111 Encounter for antineoplastic chemotherapy: Secondary | ICD-10-CM

## 2016-12-13 DIAGNOSIS — K1230 Oral mucositis (ulcerative), unspecified: Secondary | ICD-10-CM

## 2016-12-13 DIAGNOSIS — R601 Generalized edema: Secondary | ICD-10-CM

## 2016-12-13 DIAGNOSIS — D469 Myelodysplastic syndrome, unspecified: Secondary | ICD-10-CM

## 2016-12-13 LAB — CBC WITH DIFFERENTIAL/PLATELET
BASO%: 0.6 % (ref 0.0–2.0)
Basophils Absolute: 0 10*3/uL (ref 0.0–0.1)
EOS ABS: 0.2 10*3/uL (ref 0.0–0.5)
EOS%: 6.1 % (ref 0.0–7.0)
HCT: 35.6 % — ABNORMAL LOW (ref 38.4–49.9)
HEMOGLOBIN: 11.8 g/dL — AB (ref 13.0–17.1)
LYMPH%: 12.4 % — ABNORMAL LOW (ref 14.0–49.0)
MCH: 26.4 pg — ABNORMAL LOW (ref 27.2–33.4)
MCHC: 33.1 g/dL (ref 32.0–36.0)
MCV: 79.6 fL (ref 79.3–98.0)
MONO#: 0.6 10*3/uL (ref 0.1–0.9)
MONO%: 17.3 % — AB (ref 0.0–14.0)
NEUT%: 63.6 % (ref 39.0–75.0)
NEUTROS ABS: 2.2 10*3/uL (ref 1.5–6.5)
PLATELETS: 138 10*3/uL — AB (ref 140–400)
RBC: 4.47 10*6/uL (ref 4.20–5.82)
RDW: 16.2 % — AB (ref 11.0–14.6)
WBC: 3.5 10*3/uL — AB (ref 4.0–10.3)
lymph#: 0.4 10*3/uL — ABNORMAL LOW (ref 0.9–3.3)

## 2016-12-13 LAB — COMPREHENSIVE METABOLIC PANEL
ALBUMIN: 3.1 g/dL — AB (ref 3.5–5.0)
ALK PHOS: 118 U/L (ref 40–150)
ALT: 18 U/L (ref 0–55)
AST: 21 U/L (ref 5–34)
Anion Gap: 8 mEq/L (ref 3–11)
BUN: 16.4 mg/dL (ref 7.0–26.0)
CO2: 22 meq/L (ref 22–29)
CREATININE: 0.9 mg/dL (ref 0.7–1.3)
Calcium: 9.1 mg/dL (ref 8.4–10.4)
Chloride: 106 mEq/L (ref 98–109)
GLUCOSE: 120 mg/dL (ref 70–140)
Potassium: 3.8 mEq/L (ref 3.5–5.1)
SODIUM: 136 meq/L (ref 136–145)
TOTAL PROTEIN: 7.2 g/dL (ref 6.4–8.3)
Total Bilirubin: 0.25 mg/dL (ref 0.20–1.20)

## 2016-12-13 MED ORDER — DEXAMETHASONE SODIUM PHOSPHATE 10 MG/ML IJ SOLN
10.0000 mg | Freq: Once | INTRAMUSCULAR | Status: AC
  Administered 2016-12-13: 10 mg via INTRAVENOUS

## 2016-12-13 MED ORDER — DEXTROSE 5 % IV SOLN
Freq: Once | INTRAVENOUS | Status: AC
  Administered 2016-12-13: 12:00:00 via INTRAVENOUS

## 2016-12-13 MED ORDER — DEXAMETHASONE SODIUM PHOSPHATE 10 MG/ML IJ SOLN
INTRAMUSCULAR | Status: AC
  Filled 2016-12-13: qty 1

## 2016-12-13 MED ORDER — DOXORUBICIN HCL LIPOSOMAL CHEMO INJECTION 2 MG/ML
20.0000 mg/m2 | Freq: Once | INTRAVENOUS | Status: AC
  Administered 2016-12-13: 44 mg via INTRAVENOUS
  Filled 2016-12-13: qty 22

## 2016-12-13 NOTE — Telephone Encounter (Signed)
appts made and avs printed for patient per 12/13/16 los  Victor Hayden

## 2016-12-13 NOTE — Patient Instructions (Signed)
[facility] Discharge Instructions for Patients Receiving Chemotherapy  Today you received the following chemotherapy agents Doxil  To help prevent nausea and vomiting after your treatment, we encourage you to take your nausea medication As directed  If you develop nausea and vomiting that is not controlled by your nausea medication, call the clinic.   BELOW ARE SYMPTOMS THAT SHOULD BE REPORTED IMMEDIATELY:  *FEVER GREATER THAN 100.5 F  *CHILLS WITH OR WITHOUT FEVER  NAUSEA AND VOMITING THAT IS NOT CONTROLLED WITH YOUR NAUSEA MEDICATION  *UNUSUAL SHORTNESS OF BREATH  *UNUSUAL BRUISING OR BLEEDING  TENDERNESS IN MOUTH AND THROAT WITH OR WITHOUT PRESENCE OF ULCERS  *URINARY PROBLEMS  *BOWEL PROBLEMS  UNUSUAL RASH Items with * indicate a potential emergency and should be followed up as soon as possible.  Feel free to call the clinic you have any questions or concerns. The clinic phone number is [phone].  Please show the CHEMO ALERT CARD at check-in to the Emergency Department and triage nurse.   

## 2016-12-13 NOTE — Progress Notes (Signed)
Hematology and Oncology Follow Up Visit  Victor Hayden [id number] 26-Dec-1970 46 y.o. 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 He presented with disseminated disease with multiple nodules and plaques in his upper and lower extremities, trunk as well as lymphatic involvement. He has also possible disease in the lung in the gut. He is HIV negative.   Current therapy: He is currently on Doxil 40 mg total dose every 4 weeks. His first treatment was on 09/28/2016.  Interim History: Victor Hayden presents today for a follow-up visit. Since the last visit, he reports no major complications. He continues to tolerated Doxil without any new side effects. He denied any nausea, vomiting or rash. He is experiencing some mild mucositis and has been manageable. He continues to report improvement in his generalized anasarca as well as skin lesions. He reports that all of these lesions are more healed at this time. He continues to perform activities of daily living. His appetite and performance status is excellent.   He does not report any headaches, blurry vision, syncope or seizures. He does not report any fevers, chills, sweats or weight loss. He does not report any chest pain, palpitation, orthopnea. He does not report any cough, wheezing or hemoptysis. He does not report any nausea, vomiting or abdominal pain. He does not report any change in his bowel habits. He does not report any hematuria or dysuria. He does not report any skeletal complaints. Remaining review of systems unremarkable  Medications: I have reviewed the patient's current medications.  Current Outpatient Prescriptions  Medication Sig Dispense Refill  . furosemide (LASIX) 20 MG tablet Take 20 mg by mouth daily.    . magic mouthwash w/lidocaine SOLN Take 5 mLs by mouth 4 (four) times daily as needed for mouth pain. 240 mL 3  .  prochlorperazine (COMPAZINE) 10 MG tablet Take 1 tablet (10 mg total) by mouth every 6 (six) hours as needed for nausea or vomiting. 30 tablet 0   No current facility-administered medications for this visit.      Allergies:  Allergies  Allergen Reactions  . Bee Venom Anaphylaxis    Past Medical History, Surgical history, Social history, and Family History were reviewed and updated.  Physical Exam: Blood pressure 119/74, pulse 94, temperature 98.5 F (36.9 C), temperature source Oral, resp. rate 18, height 5\' 10"  (1.778 m), weight 215 lb 4.8 oz (97.7 kg), SpO2 99 %. ECOG: 1 General appearance: Alert, awake gentleman without distress. Head: Normocephalic, without obvious abnormality no oral ulcers or lesions. Neck: no adenopathy Lymph nodes: Cervical, supraclavicular, and axillary nodes normal. Heart:regular rate and rhythm, S1, S2 normal, no murmur, click, rub or gallop Lung:chest clear, no wheezing, rales, normal symmetric air entry Abdomin: soft, non-tender, without masses or organomegaly no shifting dullness or ascites. EXT: 2+ edema noted which has improved from previous examination. Skin: Multiple angiomatous nodules and plaques noted on his upper and lower extremities.  More crusted appearance noted.   Lab Results: Lab Results  Component Value Date   WBC 3.5 (L) 12/13/2016   HGB 11.8 (L) 12/13/2016   HCT 35.6 (L) 12/13/2016   MCV 79.6 12/13/2016   PLT 138 (L) 12/13/2016     Chemistry      Component Value Date/Time   NA 136 12/13/2016 1029   K 3.8 12/13/2016 1029   CO2 22 12/13/2016 1029   BUN 16.4 12/13/2016 1029   CREATININE 0.9 12/13/2016 1029  Component Value Date/Time   CALCIUM 9.1 12/13/2016 1029   ALKPHOS 118 12/13/2016 1029   AST 21 12/13/2016 1029   ALT 18 12/13/2016 1029   BILITOT 0.25 12/13/2016 1029       Impression and Plan:  46 year old gentleman with the following issues:  1. Kaposi's sarcoma diagnosed in April 2018 after presenting  with a disseminated disease. He has involvement of the chest, trunk, upper extremities and lower extremities. He appears to have also lymphatic involvement with diffuse anasarca noted on his lower extremities. He is HIV status is negative.  CT scan for staging purposes completed on 09/27/2016 was reviewed today and showed a disseminated disease. He has lymph node involvement and possibly GI and pulmonary Kaposi's involvement.  He is currently on Doxil Started on 09/28/2016. He continues to tolerated this treatment well with excellent clinical response.  Risks and benefits of continuing this medication were reviewed today and the plan is to continue. We will consider repeat imaging studies in one to 2 months.  2. Cardiac monitoring: Baseline echocardiogram obtain 10/04/2016 showed a normal EF.  3. IV access: Peripheral veins will be used for the time being. Port-A-Cath could be considered in the future.  4. Edema: This is related due to systemic involvement of Kaposi's sarcoma. His symptoms has improved at this time. He continues to be on Lasix which have helped his symptoms.  5. Antiemetics: No nausea or vomiting noted. Compazine is available to him.  6. Mucositis: Very mild at this time. No issues reported since the last visit.  7. Follow-up: In 4 weeks for the next cycle of chemotherapy.  [facility], MD 8/15/201811:24 AM

## 2016-12-15 ENCOUNTER — Ambulatory Visit: Payer: Self-pay | Admitting: Oncology

## 2016-12-15 ENCOUNTER — Ambulatory Visit: Payer: Self-pay

## 2016-12-15 ENCOUNTER — Other Ambulatory Visit: Payer: Self-pay

## 2017-01-12 ENCOUNTER — Ambulatory Visit (HOSPITAL_BASED_OUTPATIENT_CLINIC_OR_DEPARTMENT_OTHER): Payer: Self-pay | Admitting: Oncology

## 2017-01-12 ENCOUNTER — Other Ambulatory Visit: Payer: Self-pay | Admitting: *Deleted

## 2017-01-12 ENCOUNTER — Ambulatory Visit (HOSPITAL_BASED_OUTPATIENT_CLINIC_OR_DEPARTMENT_OTHER): Payer: Self-pay

## 2017-01-12 ENCOUNTER — Telehealth: Payer: Self-pay | Admitting: Oncology

## 2017-01-12 ENCOUNTER — Other Ambulatory Visit (HOSPITAL_BASED_OUTPATIENT_CLINIC_OR_DEPARTMENT_OTHER): Payer: Self-pay

## 2017-01-12 VITALS — BP 116/74 | HR 95 | Temp 98.3°F | Resp 18 | Ht 70.0 in | Wt 208.8 lb

## 2017-01-12 DIAGNOSIS — C469 Kaposi's sarcoma, unspecified: Secondary | ICD-10-CM

## 2017-01-12 DIAGNOSIS — R601 Generalized edema: Secondary | ICD-10-CM

## 2017-01-12 DIAGNOSIS — K1230 Oral mucositis (ulcerative), unspecified: Secondary | ICD-10-CM

## 2017-01-12 DIAGNOSIS — Z5111 Encounter for antineoplastic chemotherapy: Secondary | ICD-10-CM

## 2017-01-12 DIAGNOSIS — R9081 Abnormal echoencephalogram: Secondary | ICD-10-CM

## 2017-01-12 LAB — COMPREHENSIVE METABOLIC PANEL
ALBUMIN: 3.2 g/dL — AB (ref 3.5–5.0)
ALK PHOS: 119 U/L (ref 40–150)
ALT: 14 U/L (ref 0–55)
ANION GAP: 10 meq/L (ref 3–11)
AST: 20 U/L (ref 5–34)
BILIRUBIN TOTAL: 0.29 mg/dL (ref 0.20–1.20)
BUN: 16.3 mg/dL (ref 7.0–26.0)
CO2: 21 mEq/L — ABNORMAL LOW (ref 22–29)
Calcium: 9.3 mg/dL (ref 8.4–10.4)
Chloride: 105 mEq/L (ref 98–109)
Creatinine: 0.9 mg/dL (ref 0.7–1.3)
Glucose: 105 mg/dl (ref 70–140)
POTASSIUM: 4.6 meq/L (ref 3.5–5.1)
SODIUM: 137 meq/L (ref 136–145)
TOTAL PROTEIN: 8.2 g/dL (ref 6.4–8.3)

## 2017-01-12 LAB — CBC WITH DIFFERENTIAL/PLATELET
BASO%: 1 % (ref 0.0–2.0)
Basophils Absolute: 0 10*3/uL (ref 0.0–0.1)
EOS ABS: 0.3 10*3/uL (ref 0.0–0.5)
EOS%: 7 % (ref 0.0–7.0)
HCT: 38.4 % (ref 38.4–49.9)
HGB: 12.9 g/dL — ABNORMAL LOW (ref 13.0–17.1)
LYMPH%: 12.7 % — AB (ref 14.0–49.0)
MCH: 26.7 pg — AB (ref 27.2–33.4)
MCHC: 33.5 g/dL (ref 32.0–36.0)
MCV: 79.8 fL (ref 79.3–98.0)
MONO#: 0.5 10*3/uL (ref 0.1–0.9)
MONO%: 11.3 % (ref 0.0–14.0)
NEUT%: 68 % (ref 39.0–75.0)
NEUTROS ABS: 2.7 10*3/uL (ref 1.5–6.5)
PLATELETS: 164 10*3/uL (ref 140–400)
RBC: 4.81 10*6/uL (ref 4.20–5.82)
RDW: 16.7 % — ABNORMAL HIGH (ref 11.0–14.6)
WBC: 4 10*3/uL (ref 4.0–10.3)
lymph#: 0.5 10*3/uL — ABNORMAL LOW (ref 0.9–3.3)

## 2017-01-12 MED ORDER — DEXAMETHASONE SODIUM PHOSPHATE 10 MG/ML IJ SOLN
10.0000 mg | Freq: Once | INTRAMUSCULAR | Status: AC
  Administered 2017-01-12: 10 mg via INTRAVENOUS

## 2017-01-12 MED ORDER — DEXAMETHASONE SODIUM PHOSPHATE 10 MG/ML IJ SOLN
INTRAMUSCULAR | Status: AC
  Filled 2017-01-12: qty 1

## 2017-01-12 MED ORDER — GUAIFENESIN ER 600 MG PO TB12: 600.0000 mg | ORAL_TABLET | Freq: Two times a day (BID) | ORAL | 0 refills | Status: DC

## 2017-01-12 MED ORDER — DEXTROSE 5 % IV SOLN
Freq: Once | INTRAVENOUS | Status: AC
  Administered 2017-01-12: 13:00:00 via INTRAVENOUS

## 2017-01-12 MED ORDER — DOXORUBICIN HCL LIPOSOMAL CHEMO INJECTION 2 MG/ML
20.0000 mg/m2 | Freq: Once | INTRAVENOUS | Status: AC
  Administered 2017-01-12: 44 mg via INTRAVENOUS
  Filled 2017-01-12: qty 22

## 2017-01-12 NOTE — Patient Instructions (Signed)
[facility] Discharge Instructions for Patients Receiving Chemotherapy  Today you received the following chemotherapy agents Doxil  To help prevent nausea and vomiting after your treatment, we encourage you to take your nausea medication As directed  If you develop nausea and vomiting that is not controlled by your nausea medication, call the clinic.   BELOW ARE SYMPTOMS THAT SHOULD BE REPORTED IMMEDIATELY:  *FEVER GREATER THAN 100.5 F  *CHILLS WITH OR WITHOUT FEVER  NAUSEA AND VOMITING THAT IS NOT CONTROLLED WITH YOUR NAUSEA MEDICATION  *UNUSUAL SHORTNESS OF BREATH  *UNUSUAL BRUISING OR BLEEDING  TENDERNESS IN MOUTH AND THROAT WITH OR WITHOUT PRESENCE OF ULCERS  *URINARY PROBLEMS  *BOWEL PROBLEMS  UNUSUAL RASH Items with * indicate a potential emergency and should be followed up as soon as possible.  Feel free to call the clinic you have any questions or concerns. The clinic phone number is [phone].  Please show the [place] at check-in to the Emergency Department and triage nurse.

## 2017-01-12 NOTE — Telephone Encounter (Signed)
Gave patient avs and calendar with appts.  °

## 2017-01-12 NOTE — Progress Notes (Signed)
Hematology and Oncology Follow Up Visit  Victor Hayden [id number] August 01, 1970 46 y.o. 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 He has also possible disease in the lung in the gut. He is HIV negative.   Current therapy: He is currently on Doxil 40 mg total dose every 4 weeks. His first treatment was on 09/28/2016. He is here for the next cycle of therapy.  Interim History: Victor Hayden presents today for a follow-up visit. Since the last visit, he reports sinus congestion and upper respiratory tract infection. He denied any fevers, chills or productive cough. He reports mostly nasal drip and postnasal cough. He continues to tolerated Doxil without any new side effects. He denied any nausea, vomiting or rash. He is experiencing some mild mucositis and has been manageable. He continues to report improvement in his generalized anasarca as well as skin lesions. He reports no recent complications from therapy. He denied any falls or syncope. He does report some lightheadedness at times and has stopped taking Lasix.   He does not report any headaches, blurry vision, syncope or seizures. He does not report any fevers, chills, sweats or weight loss. He does not report any chest pain, palpitation, orthopnea. He does not report any cough, wheezing or hemoptysis. He does not report any nausea, vomiting or abdominal pain. He does not report any change in his bowel habits. He does not report any hematuria or dysuria. He does not report any skeletal complaints. Remaining review of systems unremarkable  Medications: I have reviewed the patient's current medications.  Current Outpatient Prescriptions  Medication Sig Dispense Refill  . furosemide  (LASIX) 20 MG tablet Take 20 mg by mouth daily.    Marland Kitchen guaiFENesin (MUCINEX) 600 MG 12 hr tablet Take 1 tablet (600 mg total) by mouth 2 (two) times daily. 20 tablet 0  . magic mouthwash w/lidocaine SOLN Take 5 mLs by mouth 4 (four) times daily as needed for mouth pain. 240 mL 3  . prochlorperazine (COMPAZINE) 10 MG tablet Take 1 tablet (10 mg total) by mouth every 6 (six) hours as needed for nausea or vomiting. 30 tablet 0   No current facility-administered medications for this visit.      Allergies:  Allergies  Allergen Reactions  . Bee Venom Anaphylaxis    Past Medical History, Surgical history, Social history, and Family History were reviewed and updated.  Physical Exam: Blood pressure 116/74, pulse 95, temperature 98.3 F (36.8 C), temperature source Oral, resp. rate 18, height 5\' 10"  (1.778 m), weight 208 lb 12.8 oz (94.7 kg), SpO2 99 %. ECOG: 1 General appearance: Alert, awake gentleman without distress. Head: Normocephalic, without obvious abnormality no oral thrush. Neck: no adenopathy Lymph nodes: Cervical, supraclavicular, and axillary nodes normal. Heart:regular rate and rhythm, S1, S2 normal, no murmur, click, rub or gallop Lung:chest clear, no wheezing, rales, normal symmetric air entry Abdomin: soft, non-tender, without masses or organomegaly no shifting dullness or ascites. EXT: 1+ edema continues to improve from previous examination. Skin: Multiple angiomatous nodules and plaques appear to be flatter and less pigmented.   Lab Results: Lab Results  Component Value Date   WBC 4.0 01/12/2017   HGB 12.9 (L) 01/12/2017   HCT 38.4 01/12/2017   MCV 79.8 01/12/2017   PLT 164 01/12/2017  Chemistry      Component Value Date/Time   NA 136 12/13/2016 1029   K 3.8 12/13/2016 1029   CO2 22 12/13/2016 1029   BUN 16.4 12/13/2016 1029   CREATININE 0.9 12/13/2016 1029      Component Value Date/Time   CALCIUM 9.1 12/13/2016 1029   ALKPHOS 118 12/13/2016 1029   AST  21 12/13/2016 1029   ALT 18 12/13/2016 1029   BILITOT 0.25 12/13/2016 1029       Impression and Plan:  46 year old gentleman with the following issues:  1. 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 He has involvement of the chest, trunk, upper extremities and lower extremities. He appears to have also lymphatic involvement with diffuse anasarca noted on his lower extremities. He is HIV status is negative.  CT scan for staging purposes completed on 09/27/2016 was reviewed today and showed a disseminated disease. He has lymph node involvement and possibly GI and pulmonary Kaposi's involvement.  He is currently on Doxil Started on 09/28/2016. He continues to tolerated this treatment well with excellent clinical response.  The plan is to continue with the same dose and schedule and consider repeating a CT scan in December 2018.  2. Cardiac monitoring: Baseline echocardiogram obtain 10/04/2016 showed a normal EF.  3. IV access: Peripheral veins will be used for the time being. Port-A-Cath could be considered in the future.  4. Edema: Continues to improve on therapy. I asked him to discontinue Lasix.  5. Antiemetics: No nausea or vomiting noted. Compazine is available to him.  6. Mucositis: It is to be manageable at this time.  7. Nasal congestion: Prescription for guaifenesin was given to the patient.  8 Follow-up: In 4 weeks for the next cycle of chemotherapy.  Victor Button, MD 9/14/201811:30 AM

## 2017-02-07 ENCOUNTER — Encounter (HOSPITAL_COMMUNITY): Payer: Self-pay | Admitting: Emergency Medicine

## 2017-02-07 ENCOUNTER — Other Ambulatory Visit (HOSPITAL_BASED_OUTPATIENT_CLINIC_OR_DEPARTMENT_OTHER): Payer: Self-pay

## 2017-02-07 ENCOUNTER — Emergency Department (HOSPITAL_COMMUNITY)
Admission: EM | Admit: 2017-02-07 | Discharge: 2017-02-07 | Disposition: A | Discharge status: Home / Self Care | Payer: Medicaid Other | Attending: Emergency Medicine | Admitting: Emergency Medicine

## 2017-02-07 ENCOUNTER — Emergency Department (HOSPITAL_COMMUNITY): Payer: Medicaid Other

## 2017-02-07 ENCOUNTER — Other Ambulatory Visit: Payer: Self-pay

## 2017-02-07 ENCOUNTER — Ambulatory Visit: Payer: Self-pay

## 2017-02-07 ENCOUNTER — Ambulatory Visit: Payer: Self-pay | Admitting: Oncology

## 2017-02-07 DIAGNOSIS — K209 Esophagitis, unspecified without bleeding: Secondary | ICD-10-CM

## 2017-02-07 DIAGNOSIS — Z87891 Personal history of nicotine dependence: Secondary | ICD-10-CM | POA: Insufficient documentation

## 2017-02-07 DIAGNOSIS — Z79899 Other long term (current) drug therapy: Secondary | ICD-10-CM | POA: Insufficient documentation

## 2017-02-07 DIAGNOSIS — R079 Chest pain, unspecified: Secondary | ICD-10-CM | POA: Diagnosis present

## 2017-02-07 DIAGNOSIS — C467 Kaposi's sarcoma of other sites: Secondary | ICD-10-CM | POA: Diagnosis not present

## 2017-02-07 DIAGNOSIS — J9851 Mediastinitis: Secondary | ICD-10-CM

## 2017-02-07 DIAGNOSIS — C469 Kaposi's sarcoma, unspecified: Secondary | ICD-10-CM

## 2017-02-07 LAB — I-STAT TROPONIN, ED: TROPONIN I, POC: 0 ng/mL (ref 0.00–0.08)

## 2017-02-07 LAB — CBC
HCT: 34.3 % — ABNORMAL LOW (ref 39.0–52.0)
Hemoglobin: 11.3 g/dL — ABNORMAL LOW (ref 13.0–17.0)
MCH: 26 pg (ref 26.0–34.0)
MCHC: 32.9 g/dL (ref 30.0–36.0)
MCV: 78.9 fL (ref 78.0–100.0)
PLATELETS: 178 10*3/uL (ref 150–400)
RBC: 4.35 MIL/uL (ref 4.22–5.81)
RDW: 16.3 % — AB (ref 11.5–15.5)
WBC: 4 10*3/uL (ref 4.0–10.5)

## 2017-02-07 LAB — BASIC METABOLIC PANEL
Anion gap: 10 (ref 5–15)
BUN: 21 mg/dL — AB (ref 6–20)
CALCIUM: 8.5 mg/dL — AB (ref 8.9–10.3)
CHLORIDE: 104 mmol/L (ref 101–111)
CO2: 22 mmol/L (ref 22–32)
CREATININE: 0.96 mg/dL (ref 0.61–1.24)
Glucose, Bld: 101 mg/dL — ABNORMAL HIGH (ref 65–99)
Potassium: 3.6 mmol/L (ref 3.5–5.1)
SODIUM: 136 mmol/L (ref 135–145)

## 2017-02-07 MED ORDER — IOPAMIDOL (ISOVUE-370) INJECTION 76%
100.0000 mL | Freq: Once | INTRAVENOUS | Status: AC | PRN
  Administered 2017-02-07: 100 mL via INTRAVENOUS

## 2017-02-07 MED ORDER — OMEPRAZOLE 20 MG PO CPDR: 20.0000 mg | DELAYED_RELEASE_CAPSULE | Freq: Two times a day (BID) | ORAL | 1 refills | Status: DC

## 2017-02-07 MED ORDER — IOPAMIDOL (ISOVUE-370) INJECTION 76%
INTRAVENOUS | Status: AC
  Administered 2017-02-07: 100 mL via INTRAVENOUS
  Filled 2017-02-07: qty 100

## 2017-02-07 MED ORDER — HYDROCODONE-ACETAMINOPHEN 5-325 MG PO TABS: 1.0000 | ORAL_TABLET | ORAL | 0 refills | Status: DC | PRN

## 2017-02-07 MED ORDER — GI COCKTAIL ~~LOC~~
30.0000 mL | Freq: Once | ORAL | Status: AC
  Administered 2017-02-07: 30 mL via ORAL
  Filled 2017-02-07 (×2): qty 30

## 2017-02-07 MED ORDER — SUCRALFATE 1 GM/10ML PO SUSP: 1.0000 g | Freq: Three times a day (TID) | ORAL | 0 refills | Status: DC

## 2017-02-07 MED ORDER — PANTOPRAZOLE SODIUM 40 MG IV SOLR
40.0000 mg | Freq: Once | INTRAVENOUS | Status: AC
  Administered 2017-02-07: 40 mg via INTRAVENOUS
  Filled 2017-02-07 (×2): qty 40

## 2017-02-07 MED FILL — OMEPRAZOLE 20 MG CAP: 20 | 30 days supply | Qty: 60 | Fill #0

## 2017-02-07 MED FILL — CARAFATE 1 GM/10 ML SUSP: 1 | 10 days supply | Qty: 420 | Fill #0

## 2017-02-07 MED FILL — HYDROCODON-APAP 5-325: 5-325 | 1 days supply | Qty: 10 | Fill #0

## 2017-02-07 NOTE — ED Notes (Signed)
Patient transported to CT 

## 2017-02-07 NOTE — Discharge Instructions (Signed)
Soft diet. Liquid antacid as needed. Carafate 3 times per day. [place] for pain. Call Dr. Alen Blew for follow up appointment

## 2017-02-07 NOTE — ED Provider Notes (Signed)
[facility] DEPT Provider Note   CSN: [id number] Arrival date & time: 02/07/17  1130     History   Chief Complaint Chief Complaint  Patient presents with  . Chest Pain    HPI Victor Hayden is a 46 y.o. male. CC: Chest pain  HPI:  This is a 46 year old male with HIV negative, disseminated C sarcoma. Followed with Dr. Manuella Ghazi died of [facility] oncology. Undergoing treatment with Doxil chemotherapy. He has no mediastinal lymphadenopathy from previous CTs. Her last several days has had chest pain. Burning. Pain with swallowing. Denies pain with breathing. He is not short of breath when he ambulates or breathe hard he has more pain. He has been somewhat sedentary at home for the last few days but came in today for clinic appointment and lab and his symptoms were more pronounced and he presents here. No cough. No fever. Eating, painful to eat. No vomiting. No diarrhea.  Past Medical History:  Diagnosis Date  . kaposi sarcoma dx'd 08/2016    Patient Active Problem List   Diagnosis Date Noted  . Goals of care, counseling/discussion 09/19/2016  . Kaposi sarcoma ([place]) 09/19/2016    History reviewed. No pertinent surgical history.     Home Medications    Prior to Admission medications   Medication Sig Start Date End Date Taking? Authorizing Provider  furosemide (LASIX) 20 MG tablet Take 20 mg by mouth daily.    [provider]  guaiFENesin (MUCINEX) 600 MG 12 hr tablet Take 1 tablet (600 mg total) by mouth 2 (two) times daily. 01/12/17   Wyatt Portela, MD  HYDROcodone-acetaminophen (NORCO/VICODIN) 5-325 MG tablet Take 1 tablet by mouth every 4 (four) hours as needed. 02/07/17   Tanna Furry, MD  magic mouthwash w/lidocaine SOLN Take 5 mLs by mouth 4 (four) times daily as needed for mouth pain. 11/17/16   Wyatt Portela, MD  omeprazole (PRILOSEC) 20 MG capsule Take 1 capsule (20 mg total) by mouth 2 (two) times daily. 02/07/17   Tanna Furry, MD  prochlorperazine  (COMPAZINE) 10 MG tablet Take 1 tablet (10 mg total) by mouth every 6 (six) hours as needed for nausea or vomiting. 09/19/16   Wyatt Portela, MD  sucralfate (CARAFATE) 1 GM/10ML suspension Take 10 mLs (1 g total) by mouth 4 (four) times daily -  with meals and at bedtime. 02/07/17   Tanna Furry, MD    Family History History reviewed. No pertinent family history.  Social History Social History  Substance Use Topics  . Smoking status: Former Research scientist (life sciences)  . Smokeless tobacco: Never Used  . Alcohol use No     Allergies   Bee venom   Review of Systems Review of Systems  Constitutional: Negative for appetite change, chills, diaphoresis, fatigue and fever.  HENT: Negative for mouth sores, sore throat and trouble swallowing.   Eyes: Negative for visual disturbance.  Respiratory: Negative for cough, chest tightness, shortness of breath and wheezing.   Cardiovascular: Positive for chest pain.  Gastrointestinal: Negative for abdominal distention, abdominal pain, diarrhea, nausea and vomiting.       Pain with swallowing  Endocrine: Negative for polydipsia, polyphagia and polyuria.  Genitourinary: Negative for dysuria, frequency and hematuria.  Musculoskeletal: Negative for gait problem.  Skin: Negative for color change, pallor and rash.  Neurological: Negative for dizziness, syncope, light-headedness and headaches.  Hematological: Does not bruise/bleed easily.  Psychiatric/Behavioral: Negative for behavioral problems and confusion.     Physical Exam Updated Vital Signs BP 112/78 (  BP Location: Right Arm)   Pulse 97   Temp 97.8 F (36.6 C) (Oral)   Resp 20   SpO2 100%   Physical Exam  Constitutional: He is oriented to person, place, and time. He appears well-developed and well-nourished. No distress.  HENT:  Head: Normocephalic.  Eyes: Pupils are equal, round, and reactive to light. Conjunctivae are normal. No scleral icterus.  Neck: Normal range of motion. Neck supple. No  thyromegaly present.  Cardiovascular: Exam reveals no gallop and no friction rub.   No murmur heard. No mediastinal crunch. Normal heart tones. No pleuritic rub. No pericardial rub. Clear bilateral breath sounds. Heart rate 99. Oxygen 100%  Pulmonary/Chest: Effort normal and breath sounds normal. No respiratory distress. He has no wheezes. He has no rales.  Abdominal: Soft. Bowel sounds are normal. He exhibits no distension. There is no tenderness. There is no rebound.  Musculoskeletal: Normal range of motion.  Neurological: He is alert and oriented to person, place, and time.  Skin: Skin is warm and dry. No rash noted.  Psychiatric: He has a normal mood and affect. His behavior is normal.     ED Treatments / Results  Labs (all labs ordered are listed, but only abnormal results are displayed) Labs Reviewed  BASIC METABOLIC PANEL - Abnormal; Notable for the following:       Result Value   Glucose, Bld 101 (*)    BUN 21 (*)    Calcium 8.5 (*)    All other components within normal limits  CBC - Abnormal; Notable for the following:    Hemoglobin 11.3 (*)    HCT 34.3 (*)    RDW 16.3 (*)    All other components within normal limits  I-STAT TROPONIN, ED    EKG  EKG Interpretation  Date/Time:  Wednesday February 07 2017 11:42:01 EDT Ventricular Rate:  90 PR Interval:    QRS Duration: 87 QT Interval:  365 QTC Calculation: 447 R Axis:   67 Text Interpretation:  Sinus rhythm Confirmed by Tanna Furry [phone]) on 02/07/2017 11:51:14 AM       Radiology Ct Angio Chest Pe W And/or Wo Contrast  Result Date: 02/07/2017 CLINICAL DATA:  Chest pain with shortness of breath for 2 days. History of Kaposi sarcoma. Evaluate for pulmonary embolism. EXAM: CT ANGIOGRAPHY CHEST WITH CONTRAST TECHNIQUE: Multidetector CT imaging of the chest was performed using the standard protocol during bolus administration of intravenous contrast. Multiplanar CT image reconstructions and MIPs were obtained to  evaluate the vascular anatomy. CONTRAST:  100 ml Isovue 370. COMPARISON:  Chest CT 09/27/2016. FINDINGS: Cardiovascular: The pulmonary arteries are well opacified with contrast to the level of the subsegmental branches. There is no evidence of acute pulmonary embolism. The thoracic aorta and great vessels demonstrate suboptimal opacification, but no abnormalities. The heart size is stable. There is a new small pericardial effusion with soft tissue stranding throughout the anterior and middle mediastinal fat. There is no focal fluid collection. Mediastinum/Nodes: There are no enlarged mediastinal, hilar or axillary lymph nodes.As above, there is new soft tissue stranding throughout the mediastinal fat suspicious for inflammation. There is no soft tissue emphysema. Minimal thyroid nodularity appears stable. The esophagus and trachea appear unremarkable. Lungs/Pleura: There are trace pleural effusions. No pneumothorax. Interval mild increase in diffuse interstitial prominence. There is no focal airspace disease or increasing nodularity. Upper abdomen: The visualized upper abdomen appears stable without acute findings. Musculoskeletal/Chest wall: There is no chest wall mass or suspicious osseous finding. Stable small  lucent lesion in the left aspect of the T9 vertebral body, probably a hemangioma. Review of the MIP images confirms the above findings. IMPRESSION: 1. No evidence of acute pulmonary embolism or other acute vascular findings. 2. New extensive soft tissue stranding throughout the anterior and middle mediastinal fat suspicious for mediastinitis. Mediastinal involvement by Kaposi's sarcoma considered less likely. No focal fluid collection or pneumomediastinum. 3. Trace pleural and pericardial effusions. Electronically Signed   By: Richardean Sale M.D.   On: 02/07/2017 13:25    Procedures Procedures (including critical care time)  Medications Ordered in ED Medications  gi cocktail  (Maalox,Lidocaine,Donnatal) (30 mLs Oral Given 02/07/17 1331)  pantoprazole (PROTONIX) injection 40 mg (40 mg Intravenous Given 02/07/17 1332)  iopamidol (ISOVUE-370) 76 % injection 100 mL (100 mLs Intravenous Contrast Given 02/07/17 1259)     Initial Impression / Assessment and Plan / ED Course  I have reviewed the triage vital signs and the nursing notes.  Pertinent labs & imaging results that were available during my care of the patient were reviewed by me and considered in my medical decision making (see chart for details).     Pleuritic chest pain in patient with active malignancy. High risk pretest probability. CT scan obtained. No PE. He has mediastinal inflammation. I gave him a GI cocktail on arrival his symptoms completely resolved. I discussed the case with his oncologist. He felt this was consistent with probable effects of chemotherapy and/or his mediastinal Kaposi sarcoma. He felt symptomatically treatment will be appropriate. I given the patient prescriptions for Carafate, Prilosec, Vicodin. We'll call Dr. Alen Blew for follow-up appointment  Final Clinical Impressions(s) / ED Diagnoses   Final diagnoses:  Chest pain, unspecified type  Mediastinitis  Esophagitis    New Prescriptions New Prescriptions   HYDROCODONE-ACETAMINOPHEN (NORCO/VICODIN) 5-325 MG TABLET    Take 1 tablet by mouth every 4 (four) hours as needed.   OMEPRAZOLE (PRILOSEC) 20 MG CAPSULE    Take 1 capsule (20 mg total) by mouth 2 (two) times daily.   SUCRALFATE (CARAFATE) 1 GM/10ML SUSPENSION    Take 10 mLs (1 g total) by mouth 4 (four) times daily -  with meals and at bedtime.     Tanna Furry, MD 02/07/17 [phone]

## 2017-02-07 NOTE — ED Triage Notes (Signed)
Pt c/o chest pain and shortness of breath x 2 days. Pt states pain has become severe, central chest.

## 2017-02-09 ENCOUNTER — Telehealth: Payer: Self-pay | Admitting: Oncology

## 2017-02-09 NOTE — Telephone Encounter (Signed)
Patient called in to reschedule missed 10/10 appointment

## 2017-02-19 ENCOUNTER — Ambulatory Visit (HOSPITAL_BASED_OUTPATIENT_CLINIC_OR_DEPARTMENT_OTHER): Payer: Self-pay

## 2017-02-19 ENCOUNTER — Telehealth: Payer: Self-pay

## 2017-02-19 VITALS — BP 98/67 | HR 107 | Temp 98.2°F | Resp 18

## 2017-02-19 DIAGNOSIS — C469 Kaposi's sarcoma, unspecified: Secondary | ICD-10-CM

## 2017-02-19 DIAGNOSIS — Z5111 Encounter for antineoplastic chemotherapy: Secondary | ICD-10-CM

## 2017-02-19 LAB — CBC WITH DIFFERENTIAL/PLATELET
BASO%: 0.4 % (ref 0.0–2.0)
Basophils Absolute: 0 10*3/uL (ref 0.0–0.1)
EOS ABS: 0.3 10*3/uL (ref 0.0–0.5)
EOS%: 5.5 % (ref 0.0–7.0)
HCT: 33.8 % — ABNORMAL LOW (ref 38.4–49.9)
HGB: 11.1 g/dL — ABNORMAL LOW (ref 13.0–17.1)
LYMPH%: 14.8 % (ref 14.0–49.0)
MCH: 26.1 pg — AB (ref 27.2–33.4)
MCHC: 32.8 g/dL (ref 32.0–36.0)
MCV: 79.7 fL (ref 79.3–98.0)
MONO#: 0.6 10*3/uL (ref 0.1–0.9)
MONO%: 11.4 % (ref 0.0–14.0)
NEUT#: 3.3 10*3/uL (ref 1.5–6.5)
NEUT%: 67.9 % (ref 39.0–75.0)
PLATELETS: 201 10*3/uL (ref 140–400)
RBC: 4.24 10*6/uL (ref 4.20–5.82)
RDW: 17.1 % — ABNORMAL HIGH (ref 11.0–14.6)
WBC: 4.9 10*3/uL (ref 4.0–10.3)
lymph#: 0.7 10*3/uL — ABNORMAL LOW (ref 0.9–3.3)

## 2017-02-19 LAB — COMPREHENSIVE METABOLIC PANEL
ALT: 11 U/L (ref 0–55)
ANION GAP: 9 meq/L (ref 3–11)
AST: 23 U/L (ref 5–34)
Albumin: 2.7 g/dL — ABNORMAL LOW (ref 3.5–5.0)
Alkaline Phosphatase: 83 U/L (ref 40–150)
BILIRUBIN TOTAL: 0.45 mg/dL (ref 0.20–1.20)
BUN: 12.4 mg/dL (ref 7.0–26.0)
CHLORIDE: 104 meq/L (ref 98–109)
CO2: 23 meq/L (ref 22–29)
Calcium: 8.5 mg/dL (ref 8.4–10.4)
Creatinine: 0.8 mg/dL (ref 0.7–1.3)
Glucose: 114 mg/dl (ref 70–140)
POTASSIUM: 4.1 meq/L (ref 3.5–5.1)
SODIUM: 135 meq/L — AB (ref 136–145)
Total Protein: 6.7 g/dL (ref 6.4–8.3)

## 2017-02-19 MED ORDER — DOXORUBICIN HCL LIPOSOMAL CHEMO INJECTION 2 MG/ML
20.0000 mg/m2 | Freq: Once | INTRAVENOUS | Status: AC
  Administered 2017-02-19: 44 mg via INTRAVENOUS
  Filled 2017-02-19: qty 22

## 2017-02-19 MED ORDER — DEXAMETHASONE SODIUM PHOSPHATE 10 MG/ML IJ SOLN
INTRAMUSCULAR | Status: AC
  Filled 2017-02-19: qty 1

## 2017-02-19 MED ORDER — DEXAMETHASONE SODIUM PHOSPHATE 10 MG/ML IJ SOLN
10.0000 mg | Freq: Once | INTRAMUSCULAR | Status: AC
  Administered 2017-02-19: 10 mg via INTRAVENOUS

## 2017-02-19 MED ORDER — DEXTROSE 5 % IV SOLN
Freq: Once | INTRAVENOUS | Status: AC
  Administered 2017-02-19: 15:00:00 via INTRAVENOUS

## 2017-02-19 NOTE — Telephone Encounter (Signed)
Mother called that at last infusion the pt was taken to ED thinking he was having heart attack. It was dx as severe reflux. GI cocktail in ED calmed it right down. She was asking if he can get a GI cocktail today during infusion. Instructed her Dr Alen Blew not in office. She can talk with infusion RN and possibly have [facility] see pt and rx something. Mother was agreeable.

## 2017-02-19 NOTE — Patient Instructions (Signed)
[facility] Discharge Instructions for Patients Receiving Chemotherapy  Today you received the following chemotherapy agents Doxil   To help prevent nausea and vomiting after your treatment, we encourage you to take your nausea medication as directed.   If you develop nausea and vomiting that is not controlled by your nausea medication, call the clinic.   BELOW ARE SYMPTOMS THAT SHOULD BE REPORTED IMMEDIATELY:  *FEVER GREATER THAN 100.5 F  *CHILLS WITH OR WITHOUT FEVER  NAUSEA AND VOMITING THAT IS NOT CONTROLLED WITH YOUR NAUSEA MEDICATION  *UNUSUAL SHORTNESS OF BREATH  *UNUSUAL BRUISING OR BLEEDING  TENDERNESS IN MOUTH AND THROAT WITH OR WITHOUT PRESENCE OF ULCERS  *URINARY PROBLEMS  *BOWEL PROBLEMS  UNUSUAL RASH Items with * indicate a potential emergency and should be followed up as soon as possible.  Feel free to call the clinic should you have any questions or concerns. The clinic phone number is [phone].  Please show the CHEMO ALERT CARD at check-in to the Emergency Department and triage nurse.   

## 2017-02-20 ENCOUNTER — Telehealth: Payer: Self-pay

## 2017-02-20 NOTE — Telephone Encounter (Signed)
Mother called asking for Dixie's last name for filing medicare paperwork. Last name given.

## 2017-02-27 ENCOUNTER — Telehealth: Payer: Self-pay | Admitting: Medical

## 2017-02-27 NOTE — Telephone Encounter (Signed)
Pt mother called to schedule new pt appt with Mackie Pai. Pt does not have PCP and is in cancer treatment and has esophagitis per mother. Mother says she is a pt here as well and wants pt to establish at this practice.  Will Edward allow new pt appt Thursday 03/01/17 at 9:00?

## 2017-02-28 NOTE — Telephone Encounter (Signed)
Patient scheduled with Percell Miller 03/01/17 at [place].

## 2017-03-01 ENCOUNTER — Ambulatory Visit (HOSPITAL_BASED_OUTPATIENT_CLINIC_OR_DEPARTMENT_OTHER)
Admission: RE | Admit: 2017-03-01 | Discharge: 2017-03-01 | Disposition: A | Discharge status: Home / Self Care | Payer: Self-pay | Source: Ambulatory Visit | Attending: Medical | Admitting: Medical

## 2017-03-01 ENCOUNTER — Encounter: Payer: Self-pay | Admitting: Medical

## 2017-03-01 ENCOUNTER — Ambulatory Visit (INDEPENDENT_AMBULATORY_CARE_PROVIDER_SITE_OTHER): Payer: Self-pay | Admitting: Medical

## 2017-03-01 VITALS — BP 116/72 | HR 116 | Temp 98.4°F | Resp 16 | Ht 70.0 in | Wt 202.4 lb

## 2017-03-01 DIAGNOSIS — R05 Cough: Secondary | ICD-10-CM

## 2017-03-01 DIAGNOSIS — J90 Pleural effusion, not elsewhere classified: Secondary | ICD-10-CM | POA: Insufficient documentation

## 2017-03-01 DIAGNOSIS — R600 Localized edema: Secondary | ICD-10-CM | POA: Insufficient documentation

## 2017-03-01 DIAGNOSIS — Z87891 Personal history of nicotine dependence: Secondary | ICD-10-CM

## 2017-03-01 DIAGNOSIS — C469 Kaposi's sarcoma, unspecified: Secondary | ICD-10-CM

## 2017-03-01 DIAGNOSIS — B49 Unspecified mycosis: Secondary | ICD-10-CM

## 2017-03-01 DIAGNOSIS — R059 Cough, unspecified: Secondary | ICD-10-CM

## 2017-03-01 DIAGNOSIS — R000 Tachycardia, unspecified: Secondary | ICD-10-CM

## 2017-03-01 DIAGNOSIS — K2960 Other gastritis without bleeding: Secondary | ICD-10-CM

## 2017-03-01 DIAGNOSIS — R0600 Dyspnea, unspecified: Secondary | ICD-10-CM

## 2017-03-01 LAB — COMPREHENSIVE METABOLIC PANEL
ALBUMIN: 3.3 g/dL — AB (ref 3.5–5.2)
ALT: 9 U/L (ref 0–53)
AST: 18 U/L (ref 0–37)
Alkaline Phosphatase: 73 U/L (ref 39–117)
BILIRUBIN TOTAL: 0.5 mg/dL (ref 0.2–1.2)
BUN: 21 mg/dL (ref 6–23)
CALCIUM: 9 mg/dL (ref 8.4–10.5)
CHLORIDE: 98 meq/L (ref 96–112)
CO2: 26 meq/L (ref 19–32)
CREATININE: 0.95 mg/dL (ref 0.40–1.50)
GFR: 89.96 mL/min (ref >60.00)
Glucose, Bld: 95 mg/dL (ref 70–99)
Potassium: 3.8 mEq/L (ref 3.5–5.1)
SODIUM: 132 meq/L — AB (ref 135–145)
Total Protein: 7.5 g/dL (ref 6.0–8.3)

## 2017-03-01 LAB — BRAIN NATRIURETIC PEPTIDE: Pro B Natriuretic peptide (BNP): 10 pg/mL (ref 0.0–100.0)

## 2017-03-01 MED ORDER — BENZONATATE 100 MG PO CAPS: 100.0000 mg | ORAL_CAPSULE | Freq: Three times a day (TID) | ORAL | 0 refills | Status: DC | PRN

## 2017-03-01 MED ORDER — AZITHROMYCIN 250 MG PO TABS: ORAL_TABLET | ORAL | 0 refills | Status: DC

## 2017-03-01 MED ORDER — ALBUTEROL SULFATE HFA 108 (90 BASE) MCG/ACT IN AERS: 2.0000 | INHALATION_SPRAY | Freq: Four times a day (QID) | RESPIRATORY_TRACT | 2 refills | Status: DC | PRN

## 2017-03-01 MED ORDER — NYSTATIN 100000 UNIT/GM EX CREA: 1.0000 "application " | TOPICAL_CREAM | Freq: Two times a day (BID) | CUTANEOUS | 0 refills | Status: DC

## 2017-03-01 MED ORDER — BECLOMETHASONE DIPROP HFA 40 MCG/ACT IN AERB: 2.0000 | INHALATION_SPRAY | Freq: Two times a day (BID) | RESPIRATORY_TRACT | 2 refills | Status: DC

## 2017-03-01 MED ORDER — NYSTATIN 100000 UNIT/GM EX CREA: 1.0000 "application " | TOPICAL_CREAM | Freq: Two times a day (BID) | CUTANEOUS | 0 refills | Status: AC

## 2017-03-01 MED ORDER — BENZONATATE 100 MG PO CAPS: 100.0000 mg | ORAL_CAPSULE | Freq: Three times a day (TID) | ORAL | 0 refills | Status: AC | PRN

## 2017-03-01 MED ORDER — BECLOMETHASONE DIPROP HFA 40 MCG/ACT IN AERB: 2.0000 | INHALATION_SPRAY | Freq: Two times a day (BID) | RESPIRATORY_TRACT | 2 refills | Status: AC

## 2017-03-01 MED FILL — AZITHROMYCIN 250 MG TABLET: 250 | 5 days supply | Qty: 6 | Fill #0

## 2017-03-01 MED FILL — NYSTATIN 100,000 UNIT/GM CR: 100000 | 30 days supply | Qty: 30 | Fill #0

## 2017-03-01 MED FILL — BENZONATATE 100 MG CAPSULE: 100 | 7 days supply | Qty: 21 | Fill #0

## 2017-03-01 MED FILL — QVAR REDIHALER 40 MCG/ACT A: 40 | 30 days supply | Qty: 11 | Fill #0

## 2017-03-01 NOTE — Progress Notes (Signed)
Subjective:    Patient ID: Victor Hayden, male    DOB: 01/31/1971, 46 y.o.   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 5 years since last.  Pt has Karposi Sarcoma. Pt seeing oncologist and getting treatment.(Pt will see oncologist next about 3 weeks)  Pt recently diagnosed with reflux in ED recently. Also given prilosec in the ED. He was describing severe reflux. Pt states reflux symptoms have decreased. But he still has pain swallowing but much less now. Pt uses some compazine for nausea when he get his infusion for [place].  Pt states since his last chemo treatment feels weak. Get tired easily and choosing to use wheel chair. Pt does have history of smoking. Last time he smoked a cigarette he felt sob. Pt over past 20 years did smoke. Amount varied 1/2 pack to one pack a day. States recently feel easily sob. Pt feels like he has mucous to bring up mucous but ct was clear.   Some fatigue even at rest.   Pt went to ED recently due to syncope and sob. He had negative ct chest for PE. But some mediastinitis. EKG looked normal.   Some trace pleural effusion on Ct. In past heart rate has been almost tachycardic.  Pt has never seen GI MD.  Pt legs swelling recently. Has chronic in past but more so recently. Pt had been on lasix about 2 months ago but that was stopped.    Review of Systems  Constitutional: Positive for fatigue. Negative for chills and fever.  HENT: Negative for congestion, facial swelling, nosebleeds, sinus pain and sinus pressure.   Respiratory: Positive for cough and shortness of breath. Negative for chest tightness and wheezing.        Rare dry cough.  Cardiovascular: Negative for chest pain and palpitations.  Gastrointestinal: Negative for abdominal pain, anal bleeding, nausea, rectal pain and vomiting.  Musculoskeletal: Negative for back pain.       Leg  swelling.  Skin: Positive for rash.       Groin.  Neurological: Negative for dizziness, syncope, weakness and numbness.  Hematological: Negative for adenopathy. Does not bruise/bleed easily.  Psychiatric/Behavioral: Negative for behavioral problems, confusion and suicidal ideas. The patient is not nervous/anxious.    Past Medical History:  Diagnosis Date  . kaposi sarcoma dx'd 08/2016     Social History   Social History  . Marital status: Single    Spouse name: N/A  . Number of children: N/A  . Years of education: N/A   Occupational History  . Not on file.   Social History Main Topics  . Smoking status: Former Research scientist (life sciences)  . Smokeless tobacco: Never Used  . Alcohol use No  . Drug use: No  . Sexual activity: Not on file   Other Topics Concern  . Not on file   Social History Narrative  . No narrative on file    History reviewed. No pertinent surgical history.  History reviewed. No pertinent family history.  Allergies  Allergen Reactions  . Bee Venom Anaphylaxis    Current Outpatient Prescriptions on File Prior to Visit  Medication Sig Dispense Refill  . guaiFENesin (MUCINEX) 600 MG 12 hr tablet Take 1 tablet (600 mg total) by mouth 2 (two) times daily. 20 tablet 0  . magic mouthwash w/lidocaine SOLN Take 5 mLs by mouth 4 (four) times daily as needed for mouth pain.  240 mL 3  . omeprazole (PRILOSEC) 20 MG capsule Take 1 capsule (20 mg total) by mouth 2 (two) times daily. 60 capsule 1  . prochlorperazine (COMPAZINE) 10 MG tablet Take 1 tablet (10 mg total) by mouth every 6 (six) hours as needed for nausea or vomiting. 30 tablet 0  . sucralfate (CARAFATE) 1 GM/10ML suspension Take 10 mLs (1 g total) by mouth 4 (four) times daily -  with meals and at bedtime. 420 mL 0   No current facility-administered medications on file prior to visit.     BP 116/72   Pulse (!) 116   Temp 98.4 F (36.9 C) (Oral)   Resp 16   Ht 5\' 10"  (1.778 m)   Wt 202 lb 6.4 oz (91.8 kg)   SpO2  98%   BMI 29.04 kg/m       Objective:   Physical Exam  General Mental Status- Alert. General Appearance- Not in acute distress.   Skin General: Color- Normal Color. Moisture- Normal Moisture. Redness in groin with faint white dc. Kaposi sarcoma lesion to legs  Neck Carotid Arteries- Normal color. Moisture- Normal Moisture. No carotid bruits. No JVD.  Chest and Lung Exam Auscultation: Breath Sounds:-Normal.  Cardiovascular Auscultation:Rythm- Regular. Murmurs & Other Heart Sounds:Auscultation of the heart reveals- No Murmurs.  Abdomen Inspection:-Inspeection Normal. Palpation/Percussion:Note:No mass. Palpation and Percussion of the abdomen reveal- Non Tender, Non Distended + BS, no rebound or guarding.    Neurologic Cranial Nerve exam:- CN III-XII intact(No nystagmus), symmetric smile. Drift Test:- No drift. Romberg Exam:- Negative.  Heal to Toe Gait exam:-Normal. Finger to Nose:- Normal/Intact Strength:- 5/5 equal and symmetric strength both upper and lower extremities.  Lower ext- 1-2+ pedal edema bilaterally. Negative homans signs      Assessment & Plan:  For history of Kaposi's sarcoma continue with treatment with oncologist.  For history of smoking, and recent shortness of breath, I do think it would be beneficial to try Qvar steroid inhaler and make albuterol inhaler available to use as instructed.  Also since this shortness of breath is new I think it would be beneficial for you to get over-the-counter pulse ox and monitor those readings.  If you get a pulse ox less than 94% please let us know.  We will also go ahead and try to refer you to pulmonologist.  Regarding potential differential causes of shortness of breath you had recent negative CT of chest for pulmonary embolism.  But you do have some risk factors and we need to be updated if you feel your shortness of breath is worsening.  If severe worsening then advised to be seen at the emergency  department.  With the chronic swelling of your legs and recent trace pleural effusion on CT, I want to get chest x-ray today and get BNP study.  Also will include metabolic panel.  Depending on results of study might give low-dose diuretic.  For recent dry cough prescribe benzonatate.  If your chest x-ray shows possible infection today or if your cough becomes productive then I want you to start a azithromycin antibiotic.  For your reflux, I want you to continue Carafate and omeprazole.  If this worsens you could also add Zantac over-the-counter.  I am adding H. pylori breath test to your studies today.  Also trying to refer you to GI.  You have recent mild tachycardia.  Your EKG in the emergency department looked okay(sinus rhythm but tachycardia).  I repeated EKG to review no significant change.  He  has some fungal infection in your groin area.  Try to keep that area dry and I wrote for Mycostatin cream.  Follow-up in 10 days or as needed.  Note I will be in the office all next week but if you need to be seen call and see if Dr. of the day has anything available.  45 minutes spent with patient 50% explaining various dx and treatment regimens. Also explained reasons on referral.  Jovonte Commins, Percell Miller, PA-C

## 2017-03-01 NOTE — Patient Instructions (Signed)
For history of Kaposi's sarcoma continue with treatment with oncologist.  For history of smoking, and recent shortness of breath, I do think it would be beneficial to try Qvar steroid inhaler and make albuterol inhaler available to use as instructed.  Also since this shortness of breath is new I think it would be beneficial for you to get over-the-counter pulse ox and monitor those readings.  If you get a pulse ox less than 94% please let us know.  We will also go ahead and try to refer you to pulmonologist.  Regarding potential differential causes of shortness of breath you had recent negative CT of chest for pulmonary embolism.  But you do have some risk factors and we need to be updated if you feel your shortness of breath is worsening.  If severe worsening then advised to be seen at the emergency department.  With the chronic swelling of your legs and recent trace pleural effusion on CT, I want to get chest x-ray today and get BNP study.  Also will include metabolic panel.  Depending on results of study might give low-dose diuretic.  For recent dry cough prescribe benzonatate.  If your chest x-ray shows possible infection today or if your cough becomes productive then I want you to start a azithromycin antibiotic.  For your reflux, I want you to continue Carafate and omeprazole.  If this worsens you could also add Zantac over-the-counter.  I am adding H. pylori breath test to your studies today.  Also trying to refer you to GI.  You have recent mild tachycardia.  Your EKG in the emergency department looked okay.  I repeated EKG to review no significant change.  He has some fungal infection in your groin area.  Try to keep that area dry and I wrote for Mycostatin cream.  Follow-up in 10 days or as needed.  Note I will be in the office all next week but if you need to be seen call and see if Dr. of the day has anything available.

## 2017-03-02 ENCOUNTER — Telehealth: Payer: Self-pay | Admitting: Medical

## 2017-03-02 ENCOUNTER — Encounter: Payer: Self-pay | Admitting: Medical

## 2017-03-02 LAB — H. PYLORI BREATH TEST: H. pylori Breath Test: DETECTED — AB

## 2017-03-02 MED ORDER — CLARITHROMYCIN ER 500 MG PO TB24: 1000.0000 mg | ORAL_TABLET | Freq: Every day | ORAL | 0 refills | Status: DC

## 2017-03-02 MED ORDER — AMOXICILLIN 500 MG PO CAPS: ORAL_CAPSULE | ORAL | 0 refills | Status: DC

## 2017-03-02 NOTE — Telephone Encounter (Signed)
Description of amoxicillin and Biaxin sent to pt pharmacy.

## 2017-03-07 ENCOUNTER — Telehealth: Payer: Self-pay | Admitting: Medical

## 2017-03-07 NOTE — Telephone Encounter (Signed)
Called his mom and spoke with her.   Pt is on carafate, omeprazole, clarithromycin.  His mom notes that the omeprazole is not working that well. They have nexium at home- 20 mg. Would like to know if he can take this instead.  Advised him to take 40 mg of nexium daily for H pylori treatment dose. He is having some loose stools since starting biaxin, but his mom does not think he is having serious diarreha. Advised that this is ok, but if any severe loose stools or diarrhea he needs to stop the abx and seek care.  Answered all ? To her satisfaction

## 2017-03-07 NOTE — Telephone Encounter (Signed)
Pt's mom states pt is still having some burning and omeprazole does not work. Pt's mother want to know if he can take Nexium in the morning and at night. Please advise.

## 2017-03-07 NOTE — Telephone Encounter (Signed)
Patient mother called wanting to speak with nurse or provider. Patient mother would like to speak to someone in reference to sucralfate (CARAFATE) 1 GM/10ML suspension and clarithromycin (BIAXIN XL) 500 MG 24 hr tablet. Patient mother has some questions about medications, due to patient getting oncology treatment. Patient mother is available at work until [place] at [phone] and after 4pm cell # [phone].

## 2017-03-08 ENCOUNTER — Encounter: Payer: Self-pay | Admitting: *Deleted

## 2017-03-08 ENCOUNTER — Telehealth: Payer: Self-pay | Admitting: *Deleted

## 2017-03-08 ENCOUNTER — Telehealth: Payer: Self-pay | Admitting: Oncology

## 2017-03-08 NOTE — Telephone Encounter (Signed)
Mother calling to say no one has cancelled 03/09/17 appts and given her new dates. Note in chart states patient was call and a message was left for new appt date and times for 03/23/17. Gave mother new dates

## 2017-03-08 NOTE — Progress Notes (Unsigned)
Gave new appt dates to mother lynn

## 2017-03-08 NOTE — Telephone Encounter (Signed)
Left message for patient regarding appts added per 11/8 sch msg. Sending confirmation letter as well.

## 2017-03-09 ENCOUNTER — Ambulatory Visit: Payer: Self-pay

## 2017-03-09 ENCOUNTER — Ambulatory Visit: Payer: Self-pay | Admitting: Oncology

## 2017-03-09 ENCOUNTER — Other Ambulatory Visit: Payer: Self-pay

## 2017-03-15 ENCOUNTER — Ambulatory Visit (INDEPENDENT_AMBULATORY_CARE_PROVIDER_SITE_OTHER): Payer: Self-pay | Admitting: Medical

## 2017-03-15 ENCOUNTER — Encounter: Payer: Self-pay | Admitting: Medical

## 2017-03-15 ENCOUNTER — Other Ambulatory Visit: Payer: Self-pay | Admitting: Medical

## 2017-03-15 VITALS — BP 119/71 | HR 117 | Temp 98.4°F | Resp 18 | Wt 202.0 lb

## 2017-03-15 DIAGNOSIS — M79605 Pain in left leg: Secondary | ICD-10-CM

## 2017-03-15 DIAGNOSIS — Z87891 Personal history of nicotine dependence: Secondary | ICD-10-CM

## 2017-03-15 DIAGNOSIS — R600 Localized edema: Secondary | ICD-10-CM

## 2017-03-15 DIAGNOSIS — C469 Kaposi's sarcoma, unspecified: Secondary | ICD-10-CM

## 2017-03-15 DIAGNOSIS — K219 Gastro-esophageal reflux disease without esophagitis: Secondary | ICD-10-CM

## 2017-03-15 DIAGNOSIS — M79604 Pain in right leg: Secondary | ICD-10-CM

## 2017-03-15 DIAGNOSIS — R0600 Dyspnea, unspecified: Secondary | ICD-10-CM

## 2017-03-15 MED ORDER — ALBUTEROL SULFATE HFA 108 (90 BASE) MCG/ACT IN AERS: 2.0000 | INHALATION_SPRAY | Freq: Four times a day (QID) | RESPIRATORY_TRACT | 2 refills | Status: AC | PRN

## 2017-03-15 MED ORDER — FUROSEMIDE 20 MG PO TABS: 20.0000 mg | ORAL_TABLET | Freq: Every day | ORAL | 0 refills | Status: DC

## 2017-03-15 MED FILL — FUROSEMIDE 20 MG TAB: 20 | 30 days supply | Qty: 30 | Fill #0

## 2017-03-15 MED FILL — VENTOLIN HFA 90 MCG INHALER: 108 (90 BAS | 17 days supply | Qty: 18 | Fill #0

## 2017-03-15 NOTE — Patient Instructions (Signed)
For your recent severe reflux, I recommend double up on the Prilosec to 40 mg dose and continue Carafate.  I do think referral to GI would be helpful but I do understand your insurance situation presently.  For dyspnea likely associated with history of smoking, I recommend you continue Qvar and I sent albuterol inhaler downstairs to our pharmacy.  Hopefully this will be too expensive.  For your history of pedal edema, I will put you back on Lasix 20 mg daily dose.  Make sure you eat potassium rich diet.  Get metabolic panel at oncologist next week.  If your potassium is low then we will need to supplement you with K Dur.  For your chronic lower extremity edema and pain, I do think it is a good idea to get bilateral lower extremity ultrasound to make sure no DVT present.  After discussion with you I did schedule you for exam to be done at 4:30 PM tomorrow.  Follow-up in 2 weeks or as needed.

## 2017-03-15 NOTE — Progress Notes (Signed)
Subjective:    Patient ID: Victor Hayden, male    DOB: Apr 01, 1971, 46 y.o.   MRN: [id number]  HPI  Pt in for follow up.  Pt states qvar did help his breathing some for couple of hours each time he uses per his report. Pt does check his pulse ox and will get 97-98%. Cxr last visit was clear. Bnp test was normal. I did not give albuterol. Note ct chest in ED was negative. Has history of smoking but stopped just  recently.  Pt had positive h. Pylori test and he completed testing. He uses one prilosec 20 mg tab and states his stomach/reflux feels better. He is willing to start 2 tabs a day. He is also on carafate.  Pt still has lower extremity edema as before. In the past he used lasix and helped swelling. Dr. Alen Blew stopped lasix about 2 months ago.   Pt inflamed area to groin did respond to mycostatin.  Pt seeing oncologist next week.     Review of Systems  Constitutional: Negative for chills, fatigue and fever.  Respiratory: Positive for shortness of breath. Negative for cough, chest tightness and wheezing.        See HPI  Cardiovascular: Negative for chest pain and palpitations.  Gastrointestinal: Negative for abdominal pain, diarrhea, nausea and vomiting.  Genitourinary: Negative for dysuria, flank pain and frequency.  Musculoskeletal: Negative for back pain, neck pain and neck stiffness.       Lower ext pain and swelling and chronic.  Still has pedal edema bilaterally extending into the thighs.  Skin: Negative for rash.       Skin ingrowing feels better  Neurological: Negative for dizziness, light-headedness and headaches.  Hematological: Negative for adenopathy. Does not bruise/bleed easily.  Psychiatric/Behavioral: Negative for behavioral problems, confusion, dysphoric mood and hallucinations. The patient is not nervous/anxious.    Past Medical History:  Diagnosis Date  . kaposi sarcoma dx'd 08/2016     Social History   Socioeconomic History  . Marital  status: Single    Spouse name: Not on file  . Number of children: Not on file  . Years of education: Not on file  . Highest education level: Not on file  Social Needs  . Financial resource strain: Not on file  . Food insecurity - worry: Not on file  . Food insecurity - inability: Not on file  . Transportation needs - medical: Not on file  . Transportation needs - non-medical: Not on file  Occupational History  . Not on file  Tobacco Use  . Smoking status: Former Research scientist (life sciences)  . Smokeless tobacco: Never Used  Substance and Sexual Activity  . Alcohol use: No  . Drug use: No  . Sexual activity: Not on file  Other Topics Concern  . Not on file  Social History Narrative  . Not on file    No past surgical history on file.  No family history on file.  Allergies  Allergen Reactions  . Bee Venom Anaphylaxis    Current Outpatient Medications on File Prior to Visit  Medication Sig Dispense Refill  . albuterol (PROVENTIL HFA;VENTOLIN HFA) 108 (90 Base) MCG/ACT inhaler Inhale 2 puffs into the lungs every 6 (six) hours as needed for wheezing or shortness of breath. 1 Inhaler 2  . beclomethasone (QVAR) 40 MCG/ACT inhaler Inhale 2 puffs into the lungs 2 (two) times daily. 1 Inhaler 2  . benzonatate (TESSALON) 100 MG capsule Take 1 capsule (100 mg total) by mouth 3 (  three) times daily as needed for cough. 21 capsule 0  . esomeprazole (NEXIUM) 20 MG capsule Take 20 mg daily at 12 noon by mouth.    . magic mouthwash w/lidocaine SOLN Take 5 mLs by mouth 4 (four) times daily as needed for mouth pain. 240 mL 3  . nystatin cream (MYCOSTATIN) Apply 1 application topically 2 (two) times daily. 30 g 0  . prochlorperazine (COMPAZINE) 10 MG tablet Take 1 tablet (10 mg total) by mouth every 6 (six) hours as needed for nausea or vomiting. 30 tablet 0  . sucralfate (CARAFATE) 1 GM/10ML suspension Take 10 mLs (1 g total) by mouth 4 (four) times daily -  with meals and at bedtime. 420 mL 0  . amoxicillin  (AMOXIL) 500 MG capsule 2 tablets p.o. twice daily times 10 days (Patient not taking: Reported on 03/15/2017) 40 capsule 0  . clarithromycin (BIAXIN XL) 500 MG 24 hr tablet Take 2 tablets (1,000 mg total) by mouth daily. (Patient not taking: Reported on 03/15/2017) 20 tablet 0  . guaiFENesin (MUCINEX) 600 MG 12 hr tablet Take 1 tablet (600 mg total) by mouth 2 (two) times daily. (Patient not taking: Reported on 03/15/2017) 20 tablet 0  . omeprazole (PRILOSEC) 20 MG capsule Take 1 capsule (20 mg total) by mouth 2 (two) times daily. (Patient not taking: Reported on 03/15/2017) 60 capsule 1   No current facility-administered medications on file prior to visit.     BP 119/71   Pulse (!) 117   Temp 98.4 F (36.9 C) (Oral)   Resp 18   Wt 202 lb (91.6 kg)   SpO2 100%   BMI 28.98 kg/m       Objective:   Physical Exam  General Mental Status- Alert. General Appearance- Not in acute distress.   Skin General: Color- Normal Color. Moisture- Normal Moisture. Redness in groin area resolved. Kaposi sarcoma lesion to legs  Neck Carotid Arteries- Normal color. Moisture- Normal Moisture. No carotid bruits. No JVD.  Chest and Lung Exam Auscultation: Breath Sounds:-Normal.  Cardiovascular Auscultation:Rythm- Regular. Murmurs & Other Heart Sounds:Auscultation of the heart reveals- No Murmurs.  Abdomen Inspection:-Inspeection Normal. Palpation/Percussion:Note:No mass. Palpation and Percussion of the abdomen reveal- Non Tender, Non Distended + BS, no rebound or guarding.    Neurologic Cranial Nerve exam:- CN III-XII intact(No nystagmus), symmetric smile. Strength:- 5/5 equal and symmetric strength both upper and lower extremities.  Lower ext- 1-2+ pedal edema bilaterally diffuse. Negative homans signs      Assessment & Plan:  For your recent severe reflux, I recommend double up on the Prilosec to 40 mg dose and continue Carafate.  I do think referral to GI would be helpful  but I do understand your insurance situation presently.  For dyspnea likely associated with history of smoking, I recommend you continue Qvar and I sent albuterol inhaler downstairs to our pharmacy.  Hopefully this will be too expensive.  For your history of pedal edema, I will put you back on Lasix 20 mg daily dose.  Make sure you eat potassium rich diet.  Get metabolic panel at oncologist next week.  If your potassium is low then we will need to supplement you with K Dur.  For your chronic lower extremity edema and pain, I do think it is a good idea to get bilateral lower extremity ultrasound to make sure no DVT present.  After discussion with you I did schedule you for exam to be done at 4:30 PM tomorrow.  Follow-up in 2  weeks or as needed.  Keniel Ralston, Percell Miller, PA-C

## 2017-03-16 ENCOUNTER — Ambulatory Visit (HOSPITAL_BASED_OUTPATIENT_CLINIC_OR_DEPARTMENT_OTHER)
Admission: RE | Admit: 2017-03-16 | Discharge: 2017-03-16 | Disposition: A | Discharge status: Home / Self Care | Payer: Self-pay | Source: Ambulatory Visit | Attending: Medical | Admitting: Medical

## 2017-03-16 DIAGNOSIS — M79604 Pain in right leg: Secondary | ICD-10-CM | POA: Insufficient documentation

## 2017-03-16 DIAGNOSIS — M79605 Pain in left leg: Secondary | ICD-10-CM | POA: Insufficient documentation

## 2017-03-16 DIAGNOSIS — R600 Localized edema: Secondary | ICD-10-CM | POA: Insufficient documentation

## 2017-03-20 ENCOUNTER — Telehealth: Payer: Self-pay | Admitting: Internal Medicine

## 2017-03-20 ENCOUNTER — Encounter: Payer: Self-pay | Admitting: Internal Medicine

## 2017-03-20 ENCOUNTER — Ambulatory Visit (INDEPENDENT_AMBULATORY_CARE_PROVIDER_SITE_OTHER): Payer: Self-pay | Admitting: Internal Medicine

## 2017-03-20 VITALS — BP 118/68 | HR 97 | Ht 70.0 in | Wt 204.8 lb

## 2017-03-20 DIAGNOSIS — R530 Neoplastic (malignant) related fatigue: Secondary | ICD-10-CM | POA: Insufficient documentation

## 2017-03-20 DIAGNOSIS — C469 Kaposi's sarcoma, unspecified: Secondary | ICD-10-CM

## 2017-03-20 DIAGNOSIS — T451X5A Adverse effect of antineoplastic and immunosuppressive drugs, initial encounter: Secondary | ICD-10-CM

## 2017-03-20 DIAGNOSIS — R5381 Other malaise: Secondary | ICD-10-CM

## 2017-03-20 DIAGNOSIS — R0600 Dyspnea, unspecified: Secondary | ICD-10-CM | POA: Insufficient documentation

## 2017-03-20 DIAGNOSIS — R0689 Other abnormalities of breathing: Secondary | ICD-10-CM

## 2017-03-20 LAB — NITRIC OXIDE: Nitric Oxide: 18

## 2017-03-20 NOTE — Telephone Encounter (Signed)
[facility] received pt's PT order. With the type of insurance he has, PT is not covered.  MR - please advise. Thanks.

## 2017-03-20 NOTE — Addendum Note (Signed)
Addended by: Lorretta Harp on: 03/20/2017 11:58 AM   Modules accepted: Orders

## 2017-03-20 NOTE — Addendum Note (Signed)
Addended by: Rocky Morel D on: 03/20/2017 11:10 AM   Modules accepted: Orders

## 2017-03-20 NOTE — Telephone Encounter (Signed)
We will do that. Thanks.

## 2017-03-20 NOTE — Telephone Encounter (Signed)
Hi Victor Hayden  I saw Victor Hayden 03/20/2017 on consult for dyspnea. Suspect chemo doxil toxixcity with fatigue. Ordered CXR but he no showed v result is late (an dI wil be out of town). I recommende dhe get bmet, cbc ,. Mag and phos but he will do it later this week prior to his chemo. Ordered echo.   His overall ECOG is 3-4 now  Thanks  Dr. Brand Males, M.D., [facility].C.P Pulmonary and Critical Care Medicine Staff Physician [facility] Pager: [phone], If no answer or between  15:00h - 7:00h: call [phone]  03/20/2017 1:31 PM

## 2017-03-20 NOTE — Patient Instructions (Signed)
ICD-10-CM   1. Dyspnea and respiratory abnormalities R06.00    R06.89   2. Kaposi sarcoma ([place]) C46.9   3. Neoplastic malignant related fatigue R53.0   4. Physical deconditioning R53.81   5. Adverse effect of doxorubicin, initial encounter T45.1X5A     My concern is doxil side effects  Plan - cbc, bmet, mag, and phos and lft blood work later this week with oncology- make sure they check mag and phos  - cxr 2 view 03/20/2017  - echo cardiogram next few days - hopefully before you see Dr Alen Blew on Friday 03/23/17 - home PT to work on conditioning  Followup Next 1-3 weeks with me or an APP ER if worse

## 2017-03-20 NOTE — Progress Notes (Signed)
Subjective:    Patient ID: Victor Hayden, male    DOB: May 30, 1970, 46 y.o.   MRN: [id number]  PCP Saguier, Percell Miller, PA-C   HPI  IOV 03/20/2017  Chief Complaint  Patient presents with  . Advice Only    Referred by Dr. Mackie Hayden.  Pt is a cancer pt with kaposi sarcoma and is having difficulty breathing. Pt also c/o burning in throat due to issues with GERD.    46 year old male accompanied by his mother Victor Hayden who is a Equities trader at [place].  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  He presented with disseminated disease with multiple nodules and plaques in his upper and lower extremities trunk as well as lymphatic involvement.  He was started on Doxil chemotherapy every 4 weeks starting Sep 28, 2016.  His next chemotherapy is due in a few days.  According to the mom and the patient for the last 2 months he has had insidious onset of shortness of breath that is progressive and associated with significant amount of fatigue.  His ECOG score is currently 3-4.  And is because of the shortness of breath and fatigue.  He is able to change his clothes but only barely and for the last 2 months also using a wheelchair to get around.  He is not on any oxygen.  He does have chronic edema treated with Lasix but this is because of Kaposi's sarcoma.  He is able to lie flat without dyspnea but uses 2 pillows on account of severe acid reflux which he states is H. pylori positive and has a GI consult pending.  He says he also suffered mediastinitis in October 2018.  He denies any fever but does have some cough and congestion but no wheezing or orthopnea proximal nocturnal dyspnea.  As of September 2018 oncology notes indicated that he was not having any side effects from the Doxil chemotherapy.  In September 2018 his ECOG was 1   ESAS score show multiple  symoptoms   [place] Symptom Assessment Numerical Scale 0 is no problem -> 10 worst problem 03/20/2017    No Pain -> Worst pain 7  No Tiredness -> Worset tiredness 8  No Nausea -> Worst nausea 2  No Depression -> Worst depression 2  No Anxiety -> Worst Anxiety 4  No Drowsiness -> Worst Drowsiness 6  Best appetite-> Worst Appetitle 6  Best Feeling of well being -> Worst feeling 7  No dyspnea-> Worst dyspnea 6  Other problem (none -> severe) x  Completed by  patient         His most recent CT chest was in October 2018 that showed some mediastinitis but no pulmonary nodules.  His mom states he is responding in the pulmonary system to his chemotherapy.  His most recent chest x-ray March 01, 2017: I personally visualized looks clear to me although there is some possible nonspecific atelectasis in the base.  His echocardiogram June 8182 grade 1 diastolic dysfunction.Blood work March 01, 2017 shows normal creatinine  Slight low sodium of 132. Hgb in oct 2018: 11.1gm%  FeNO 03/20/17 - 18ppb and normal BNP 03/01/17 - 10 pbb VAscular LE 03/16/17 - No DVT       has a past medical history of kaposi sarcoma (dx'd 08/2016).   reports that he quit smoking about 2 months ago.  His smoking use included cigarettes. He started smoking about 30 years ago. He has a 29.00 pack-year smoking history. he has never used smokeless tobacco.  No past surgical history on file.  Allergies  Allergen Reactions  . Bee Venom Anaphylaxis     There is no immunization history on file for this patient.  No family history on file.   Current Outpatient Medications:  .  albuterol (PROVENTIL HFA;VENTOLIN HFA) 108 (90 Base) MCG/ACT inhaler, Inhale 2 puffs every 6 (six) hours as needed into the lungs for wheezing or shortness of breath., Disp: 1 Inhaler, Rfl: 2 .  beclomethasone (QVAR) 40 MCG/ACT inhaler, Inhale 2 puffs into the lungs 2 (two) times daily., Disp: 1 Inhaler, Rfl: 2 .  esomeprazole (NEXIUM)  20 MG capsule, Take 20 mg daily at 12 noon by mouth., Disp: , Rfl:  .  furosemide (LASIX) 20 MG tablet, Take 1 tablet (20 mg total) daily by mouth., Disp: 30 tablet, Rfl: 0 .  magic mouthwash w/lidocaine SOLN, Take 5 mLs by mouth 4 (four) times daily as needed for mouth pain., Disp: 240 mL, Rfl: 3 .  nystatin cream (MYCOSTATIN), Apply 1 application topically 2 (two) times daily., Disp: 30 g, Rfl: 0 .  prochlorperazine (COMPAZINE) 10 MG tablet, Take 1 tablet (10 mg total) by mouth every 6 (six) hours as needed for nausea or vomiting., Disp: 30 tablet, Rfl: 0 .  sucralfate (CARAFATE) 1 GM/10ML suspension, Take 10 mLs (1 g total) by mouth 4 (four) times daily -  with meals and at bedtime., Disp: 420 mL, Rfl: 0 .  benzonatate (TESSALON) 100 MG capsule, Take 1 capsule (100 mg total) by mouth 3 (three) times daily as needed for cough. (Patient not taking: Reported on 03/20/2017), Disp: 21 capsule, Rfl: 0    Review of Systems  Constitutional: Positive for fever. Negative for unexpected weight change.  HENT: Positive for postnasal drip, sinus pressure, sneezing, sore throat and trouble swallowing. Negative for congestion, dental problem, ear pain, nosebleeds and rhinorrhea.   Eyes: Negative for redness and itching.  Respiratory: Positive for cough, choking, chest tightness and shortness of breath.   Cardiovascular: Positive for palpitations and leg swelling.  Gastrointestinal: Positive for vomiting. Negative for nausea.  Genitourinary: Negative for dysuria.  Musculoskeletal: Negative for joint swelling.  Skin: Negative for rash.  Allergic/Immunologic: Positive for food allergies. Negative for environmental allergies and immunocompromised state.  Neurological: Negative for headaches.  Hematological: Does not bruise/bleed easily.  Psychiatric/Behavioral: Negative for dysphoric mood. The patient is nervous/anxious.        Objective:   Physical Exam  Vitals:   03/20/17 1021  BP: 118/68  Pulse:  97  SpO2: 100%  Weight: 204 lb 12.8 oz (92.9 kg)  Height: 5\' 10"  (1.778 m)   Estimated body mass index is 29.39 kg/m as calculated from the following:   Height as of this encounter: 5\' 10"  (1.778 m).   Weight as of this encounter: 204 lb 12.8 oz (92.9 kg).  And exhausted looking young male sitting in a wheelchair.  Chronically deconditioned looking. Psychiatric: Flat affect Neurology: Alert and oriented x3.  Speech normal.  Moves all 4 extremities. Cardiovascular: Normal heart sounds.  Normal cardiac.  No murmurs Respiratory exam: Clear to auscultation no wheeze no crackles no respiratory distress  abdomen: Soft nontender Extremities: Chronic pedal edema.  No clubbing no cyanosis Skin exam: Looks intact in the exposed areas Joint exam: No obvious deformities.       Assessment & Plan:  ICD-10-CM   1. Dyspnea and respiratory abnormalities R06.00    R06.89   2. Kaposi sarcoma ([place]) C46.9   3. Neoplastic malignant related fatigue R53.0   4. Physical deconditioning R53.81   5. Adverse effect of doxorubicin, initial encounter T45.1X5A      My concern is doxil side effects ; fatigue shortness of breath cardiomyopathy physical deconditioning and other blood abnormalities all reported at greater than 10% level.  Plan - cbc, bmet, mag, and phos and lft blood work later this week with oncology- make sure they check mag and phos  - cxr 2 view 03/20/2017  - echo cardiogram next few days - hopefully before you see Dr Alen Blew on Friday 03/23/17 - home PT to work on conditioning  Followup Next 1-3 weeks with me or an APP ER if worse

## 2017-03-20 NOTE — Addendum Note (Signed)
Addended by: Rocky Morel D on: 03/20/2017 11:49 AM   Modules accepted: Orders

## 2017-03-21 ENCOUNTER — Ambulatory Visit (HOSPITAL_COMMUNITY): Payer: Self-pay | Attending: Internal Medicine

## 2017-03-21 ENCOUNTER — Other Ambulatory Visit: Payer: Self-pay

## 2017-03-21 ENCOUNTER — Ambulatory Visit (INDEPENDENT_AMBULATORY_CARE_PROVIDER_SITE_OTHER)
Admission: RE | Admit: 2017-03-21 | Discharge: 2017-03-21 | Disposition: A | Discharge status: Home / Self Care | Payer: Medicaid Other | Source: Ambulatory Visit | Attending: Internal Medicine | Admitting: Internal Medicine

## 2017-03-21 ENCOUNTER — Other Ambulatory Visit (HOSPITAL_COMMUNITY): Payer: Medicaid Other

## 2017-03-21 DIAGNOSIS — R0689 Other abnormalities of breathing: Secondary | ICD-10-CM

## 2017-03-21 DIAGNOSIS — C469 Kaposi's sarcoma, unspecified: Secondary | ICD-10-CM

## 2017-03-21 DIAGNOSIS — R0600 Dyspnea, unspecified: Secondary | ICD-10-CM

## 2017-03-21 DIAGNOSIS — I071 Rheumatic tricuspid insufficiency: Secondary | ICD-10-CM | POA: Insufficient documentation

## 2017-03-21 DIAGNOSIS — R530 Neoplastic (malignant) related fatigue: Secondary | ICD-10-CM

## 2017-03-21 DIAGNOSIS — T451X5A Adverse effect of antineoplastic and immunosuppressive drugs, initial encounter: Secondary | ICD-10-CM

## 2017-03-21 DIAGNOSIS — R5381 Other malaise: Secondary | ICD-10-CM

## 2017-03-21 NOTE — Telephone Encounter (Signed)
LMTCB-patient needs to understand we are waiting for answers from MR but also need to know his insurance information as no card is found in [employer]. We can attempt to call Insurance to seek out approval for PT.

## 2017-03-23 ENCOUNTER — Other Ambulatory Visit: Payer: Self-pay | Admitting: Oncology

## 2017-03-23 ENCOUNTER — Ambulatory Visit (HOSPITAL_BASED_OUTPATIENT_CLINIC_OR_DEPARTMENT_OTHER): Payer: Self-pay | Admitting: Oncology

## 2017-03-23 ENCOUNTER — Other Ambulatory Visit: Payer: Self-pay | Admitting: Internal Medicine

## 2017-03-23 ENCOUNTER — Ambulatory Visit (HOSPITAL_BASED_OUTPATIENT_CLINIC_OR_DEPARTMENT_OTHER): Payer: Self-pay

## 2017-03-23 ENCOUNTER — Other Ambulatory Visit: Payer: Self-pay | Admitting: *Deleted

## 2017-03-23 ENCOUNTER — Other Ambulatory Visit (HOSPITAL_BASED_OUTPATIENT_CLINIC_OR_DEPARTMENT_OTHER): Payer: Self-pay

## 2017-03-23 VITALS — BP 117/72 | HR 118 | Temp 98.5°F | Resp 18 | Ht 70.0 in | Wt 205.6 lb

## 2017-03-23 DIAGNOSIS — C469 Kaposi's sarcoma, unspecified: Secondary | ICD-10-CM

## 2017-03-23 DIAGNOSIS — R5383 Other fatigue: Secondary | ICD-10-CM

## 2017-03-23 DIAGNOSIS — R1310 Dysphagia, unspecified: Secondary | ICD-10-CM

## 2017-03-23 DIAGNOSIS — R609 Edema, unspecified: Secondary | ICD-10-CM

## 2017-03-23 DIAGNOSIS — D649 Anemia, unspecified: Secondary | ICD-10-CM

## 2017-03-23 LAB — CBC WITH DIFFERENTIAL/PLATELET
BASO%: 0.3 % (ref 0.0–2.0)
BASOS ABS: 0 10*3/uL (ref 0.0–0.1)
EOS%: 0.8 % (ref 0.0–7.0)
Eosinophils Absolute: 0 10*3/uL (ref 0.0–0.5)
HEMATOCRIT: 26.4 % — AB (ref 38.4–49.9)
HGB: 8.4 g/dL — ABNORMAL LOW (ref 13.0–17.1)
LYMPH#: 0.6 10*3/uL — AB (ref 0.9–3.3)
LYMPH%: 17 % (ref 14.0–49.0)
MCH: 25.8 pg — AB (ref 27.2–33.4)
MCHC: 31.8 g/dL — AB (ref 32.0–36.0)
MCV: 81 fL (ref 79.3–98.0)
MONO#: 0.5 10*3/uL (ref 0.1–0.9)
MONO%: 14.3 % — ABNORMAL HIGH (ref 0.0–14.0)
NEUT#: 2.5 10*3/uL (ref 1.5–6.5)
NEUT%: 67.6 % (ref 39.0–75.0)
PLATELETS: 146 10*3/uL (ref 140–400)
RBC: 3.26 10*6/uL — ABNORMAL LOW (ref 4.20–5.82)
RDW: 18.7 % — ABNORMAL HIGH (ref 11.0–14.6)
WBC: 3.7 10*3/uL — ABNORMAL LOW (ref 4.0–10.3)

## 2017-03-23 LAB — COMPREHENSIVE METABOLIC PANEL
ALT: 9 U/L (ref 0–55)
AST: 31 U/L (ref 5–34)
Albumin: 2.6 g/dL — ABNORMAL LOW (ref 3.5–5.0)
Alkaline Phosphatase: 67 U/L (ref 40–150)
Anion Gap: 11 mEq/L (ref 3–11)
BUN: 17.8 mg/dL (ref 7.0–26.0)
CO2: 21 meq/L — AB (ref 22–29)
Calcium: 8.6 mg/dL (ref 8.4–10.4)
Chloride: 102 mEq/L (ref 98–109)
Creatinine: 1 mg/dL (ref 0.7–1.3)
GLUCOSE: 91 mg/dL (ref 70–140)
POTASSIUM: 3.5 meq/L (ref 3.5–5.1)
SODIUM: 134 meq/L — AB (ref 136–145)
Total Bilirubin: 0.99 mg/dL (ref 0.20–1.20)
Total Protein: 6.8 g/dL (ref 6.4–8.3)

## 2017-03-23 LAB — MAGNESIUM: Magnesium: 1.8 mg/dl (ref 1.5–2.5)

## 2017-03-23 MED ORDER — SUCRALFATE 1 GM/10ML PO SUSP: 1.0000 g | Freq: Three times a day (TID) | ORAL | 0 refills | Status: AC

## 2017-03-23 MED FILL — CARAFATE 1 GM/10 ML SUSP: 1 | 11 days supply | Qty: 420 | Fill #0

## 2017-03-23 NOTE — Progress Notes (Signed)
Hematology and Oncology Follow Up Visit  Victor Hayden [id number] [birth year] 46 y.o. 03/23/2017 1:28 PM Saguier, Percell Miller, PA-CSaguier, Iris Hayden   Principle Diagnosis: 46 year old gentleman diagnosed with Kaposi's sarcoma in April 2018. He presented with disseminated disease with multiple nodules and plaques in his upper and lower extremities, trunk as well as lymphatic involvement. He has also possible disease in the lung in the gut. He is HIV negative.   Current therapy: He is currently on Doxil 40 mg total dose every 4 weeks. His first treatment was on 09/28/2016. He is here for the next cycle of therapy.  Interim History: Victor Hayden presents today for a follow-up visit. Since the last visit, he reports overall decline in his health.  He reported excessive fatigue, tiredness as well as dyspnea on exertion.  He continues to have issues with dysphasia and does use Carafate which helped his symptoms.  He reports no new Kaposi's lesions noted and his old lesions appear to be well healed.  He denies any hematochezia or melena.  He denies any hemoptysis or hematemesis.  His performance status has been limited.  He is pending more time in bed and in chair.  He does not report any headaches, blurry vision, syncope or seizures. He does not report any fevers, chills, sweats or weight loss. He does not report any chest pain, palpitation, orthopnea. He does not report any cough, wheezing or hemoptysis. He does not report any nausea, vomiting or abdominal pain. He does not report any change in his bowel habits. He does not report any hematuria or dysuria. He does not report any skeletal complaints. Remaining review of systems unremarkable  Medications: I have reviewed the patient's current medications.  Current Outpatient Medications  Medication Sig Dispense Refill  . albuterol (PROVENTIL HFA;VENTOLIN HFA) 108 (90 Base) MCG/ACT inhaler Inhale 2 puffs every 6 (six) hours as needed into the lungs  for wheezing or shortness of breath. 1 Inhaler 2  . beclomethasone (QVAR) 40 MCG/ACT inhaler Inhale 2 puffs into the lungs 2 (two) times daily. 1 Inhaler 2  . benzonatate (TESSALON) 100 MG capsule Take 1 capsule (100 mg total) by mouth 3 (three) times daily as needed for cough. 21 capsule 0  . esomeprazole (NEXIUM) 20 MG capsule Take 20 mg daily at 12 noon by mouth.    . furosemide (LASIX) 20 MG tablet Take 1 tablet (20 mg total) daily by mouth. 30 tablet 0  . magic mouthwash w/lidocaine SOLN Take 5 mLs by mouth 4 (four) times daily as needed for mouth pain. 240 mL 3  . nystatin cream (MYCOSTATIN) Apply 1 application topically 2 (two) times daily. 30 g 0  . prochlorperazine (COMPAZINE) 10 MG tablet Take 1 tablet (10 mg total) by mouth every 6 (six) hours as needed for nausea or vomiting. 30 tablet 0  . sucralfate (CARAFATE) 1 GM/10ML suspension Take 10 mLs (1 g total) by mouth 4 (four) times daily -  with meals and at bedtime. 420 mL 0   No current facility-administered medications for this visit.      Allergies:  Allergies  Allergen Reactions  . Bee Venom Anaphylaxis    Past Medical History, Surgical history, Social history, and Family History were reviewed and updated.  Physical Exam: Blood pressure 117/72, pulse (!) 118, temperature 98.5 F (36.9 C), temperature source Oral, resp. rate 18, height 5\' 10"  (1.778 m), weight 205 lb 9.6 oz (93.3 kg), SpO2 100 %. ECOG: 1 General appearance: Chronically ill-appearing gentleman appeared without distress.  Head: Normocephalic, without obvious abnormality no oral ulcers or lesions. Neck: no adenopathy Lymph nodes: Cervical, supraclavicular, and axillary nodes normal. Heart:regular rate and rhythm, S1, S2 normal, no murmur, click, rub or gallop Lung:chest clear, no wheezing, rales, normal symmetric air entry Abdomin: soft, non-tender, without masses or organomegaly no shifting dullness or ascites. EXT: 1+ edema noted in his lower  extremities. Skin: Multiple angiomatous nodules and plaques appear to be flatter and less pigmented.  No new lesions noted.   Lab Results: Lab Results  Component Value Date   WBC 3.7 (L) 03/23/2017   HGB 8.4 (L) 03/23/2017   HCT 26.4 (L) 03/23/2017   MCV 81.0 03/23/2017   PLT 146 03/23/2017     Chemistry      Component Value Date/Time   NA 134 (L) 03/23/2017 1231   K 3.5 03/23/2017 1231   CL 98 03/01/2017 1036   CO2 21 (L) 03/23/2017 1231   BUN 17.8 03/23/2017 1231   CREATININE 1.0 03/23/2017 1231      Component Value Date/Time   CALCIUM 8.6 03/23/2017 1231   ALKPHOS 67 03/23/2017 1231   AST 31 03/23/2017 1231   ALT 9 03/23/2017 1231   BILITOT 0.99 03/23/2017 1231       Impression and Plan:  46 year old gentleman with the following issues:  1. 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 He has involvement of the chest, trunk, upper extremities and lower extremities. He appears to have also lymphatic involvement with diffuse anasarca noted on his lower extremities. He is HIV status is negative.  CT scan for staging purposes completed on 09/27/2016 showed a disseminated disease. He has lymph node involvement and possibly GI and pulmonary Kaposi's involvement.  He is currently on Doxil Started on 09/28/2016.  He had an excellent clinical response to therapy for the first 4 months.  In the last 2 months he reported overall decline in his health.  It is unclear whether this is related to Kaposi's sarcoma, Doxil or other causes.  After discussion today, I have recommended a treatment break for the time being and repeat imaging studies in the next 4-6 weeks if his symptoms do not improve.  2. Cardiac monitoring: Baseline echocardiogram obtain 10/04/2016 showed a normal EF.  Repeat echo on 03/21/2017 showed normal EF.  3. IV access: Peripheral veins will be used for the time being.   4. Edema: Appears to be overall stable at this  time.  5.  Anemia: No signs or symptoms of GI bleeding.  Could be related to chemotherapy.  I will send him up for 2 units of packed red cell transfusion.  I agree with a GI workup including colonoscopy and endoscopy to evaluate for any Kaposi's sarcoma involvement of the GI tract.  6.  Dyspnea on exertion: He was evaluated by Dr. Chase Caller and suggested Doxil as a possibility for his dyspnea.  I doubt Kaposi's sarcoma involvement in his respiratory tract.  A bronchoscopy may be useful if his symptoms persist.  7 Follow-up: In 4 weeks to follow his progress.  Zola Button, MD 11/23/20181:28 PM

## 2017-03-24 LAB — PHOSPHORUS: PHOSPHORUS: 4.3 mg/dL (ref 2.5–4.5)

## 2017-03-26 ENCOUNTER — Telehealth: Payer: Self-pay

## 2017-03-26 NOTE — Telephone Encounter (Signed)
Called and verified appointment [facility] for 5 hour blood inf. Per 11/26  los

## 2017-03-26 NOTE — Telephone Encounter (Signed)
Left message for patient to call back  

## 2017-03-26 NOTE — Telephone Encounter (Addendum)
Let patient know  1. Insurance will not pay for PT. We can refer to [facility] rehab instead 2. Labs show anemia - hgb 8.4gm% - worse than oct 2018 when it was 11gm%. Rec: he d/w Oncology Dr Alen Blew 3. Magneisiu - slightly low 1,8gm% - start mag oxide 400mg  daily 4. Agree with Rx break recommended by Dr Alen Blew 5. Echo normal but for mild heart muscle stiffness - he can try lasix 20mg  daily with kcl 39meq daily x 2 weeks (If he is not on it already 5. Hopefully #2,  #3, and #4 and #5  can help his fatigue/dyspnea 6. Keep fu with Tammy Parrett - Dr Alen Blew recommends bronch if symptoms do not resolve. So, will keep an eye for this  Dr. Brand Males, M.D., [facility].C.P Pulmonary and Critical Care Medicine Staff Physician, [place] Director - Interstitial Lung Disease  Program  Pulmonary [place] at [place], [place], [address]  Pager: [phone], If no answer or between  15:00h - 7:00h: call [phone] Telephone: [phone]      PULMONARY No results for input(s): PHART, PCO2ART, PO2ART, HCO3, TCO2, O2SAT in the last 168 hours.  Invalid input(s): PCO2, PO2  CBC Recent Labs  Lab 03/23/17 1231  HGB 8.4*  HCT 26.4*  WBC 3.7*  PLT 146   Results for Victor, Hayden (MRN [id number]) as of 03/26/2017 13:22  Ref. Range 02/07/2017 13:10 02/08/2017 12:02 02/19/2017 13:20 03/01/2017 10:21 03/01/2017 10:36 03/01/2017 11:15 03/16/2017 17:24 03/20/2017 11:58 03/21/2017 13:14 03/21/2017 14:40 03/23/2017 12:31 03/23/2017 12:31 03/23/2017 13:10 03/23/2017 13:11  Hemoglobin Latest Ref Range: 13.0 - 17.1 g/dL   11.1 (L)         8.4 (L)     COAGULATION No results for input(s): INR in the last 168 hours.  CARDIAC  No results for input(s): TROPONINI in the last 168 hours. No results for input(s): PROBNP in the last 168 hours.   CHEMISTRY Recent Labs  Lab 03/23/17 1231 03/23/17 1310 03/23/17 1311  NA 134*  --   --   K  3.5  --   --   CO2 21*  --   --   GLUCOSE 91  --   --   BUN 17.8  --   --   CREATININE 1.0  --   --   CALCIUM 8.6  --   --   MG  --   --  1.8  PHOS  --  4.3  --    Estimated Creatinine Clearance: 103.6 mL/min (by C-G formula based on SCr of 1 mg/dL).   LIVER Recent Labs  Lab 03/23/17 1231  AST 31  ALT 9  ALKPHOS 67  BILITOT 0.99  PROT 6.8  ALBUMIN 2.6*     INFECTIOUS No results for input(s): LATICACIDVEN, PROCALCITON in the last 168 hours.   ENDOCRINE CBG (last 3)  No results for input(s): GLUCAP in the last 72 hours.       IMAGING x48h  - image(s) personally visualized  -   highlighted in bold No results found.

## 2017-03-26 NOTE — Telephone Encounter (Signed)
reviwed your note. Will consider bronch definitely as I follow him off doxil  Thanks  Dr. Brand Males, M.D., [facility].C.P Pulmonary and Critical Care Medicine Staff Physician, [place] Director - Interstitial Lung Disease  Program  Pulmonary [place] at [place], [place], [address]  Pager: [phone], If no answer or between  15:00h - 7:00h: call [phone] Telephone: [phone]

## 2017-03-28 ENCOUNTER — Ambulatory Visit (HOSPITAL_COMMUNITY)
Admission: RE | Admit: 2017-03-28 | Discharge: 2017-03-28 | Disposition: A | Discharge status: Home / Self Care | Payer: Medicaid Other | Source: Ambulatory Visit | Attending: Oncology | Admitting: Oncology

## 2017-03-28 DIAGNOSIS — C469 Kaposi's sarcoma, unspecified: Secondary | ICD-10-CM | POA: Diagnosis not present

## 2017-03-28 LAB — PREPARE RBC (CROSSMATCH)

## 2017-03-28 LAB — SPECIMEN STATUS REPORT

## 2017-03-28 LAB — PHOSPHORUS: Phosphorus: 4.2 mg/dL (ref 2.5–4.5)

## 2017-03-28 LAB — MAGNESIUM: Magnesium: 2 mg/dL (ref 1.6–2.3)

## 2017-03-28 MED ORDER — HEPARIN SOD (PORK) LOCK FLUSH 100 UNIT/ML IV SOLN: 500.0000 [IU] | Freq: Every day | INTRAVENOUS | Status: DC | PRN

## 2017-03-28 MED ORDER — HEPARIN SOD (PORK) LOCK FLUSH 100 UNIT/ML IV SOLN: 250.0000 [IU] | INTRAVENOUS | Status: DC | PRN

## 2017-03-28 MED ORDER — SODIUM CHLORIDE 0.9 % IV SOLN: 250.0000 mL | Freq: Once | INTRAVENOUS | Status: DC

## 2017-03-28 MED ORDER — FUROSEMIDE 10 MG/ML IJ SOLN
20.0000 mg | Freq: Once | INTRAMUSCULAR | Status: AC
  Administered 2017-03-28: 20 mg via INTRAVENOUS
  Filled 2017-03-28: qty 2

## 2017-03-28 MED ORDER — SODIUM CHLORIDE 0.9 % IV SOLN: Freq: Once | INTRAVENOUS | Status: DC

## 2017-03-28 MED ORDER — SODIUM CHLORIDE 0.9% FLUSH: 10.0000 mL | INTRAVENOUS | Status: DC | PRN

## 2017-03-28 MED ORDER — SODIUM CHLORIDE 0.9% FLUSH
3.0000 mL | INTRAVENOUS | Status: DC | PRN
Start: 2017-03-28 — End: 2017-03-29

## 2017-03-28 NOTE — Telephone Encounter (Signed)
lmtcb x2 for pt. 

## 2017-03-28 NOTE — Discharge Instructions (Signed)

## 2017-03-28 NOTE — Progress Notes (Signed)
Diagnosis: Anemia  Provider: IOEVOJ  Procedure: Patient received 2 units of PRBC  and IV push lasix at [facility]. Patient tolerated well.  Discharge instructions given, patient verbalized understanding. Discharge via wheelchair with mother.

## 2017-03-29 LAB — BPAM RBC
BLOOD PRODUCT EXPIRATION DATE: 201812212359
BLOOD PRODUCT EXPIRATION DATE: 201812212359
ISSUE DATE / TIME: 201811281031
ISSUE DATE / TIME: 201811281031
UNIT TYPE AND RH: 5100
Unit Type and Rh: 5100

## 2017-03-29 LAB — TYPE AND SCREEN
ABO/RH(D): O POS
ANTIBODY SCREEN: NEGATIVE
UNIT DIVISION: 0
UNIT DIVISION: 0

## 2017-03-29 LAB — ABO/RH: ABO/RH(D): O POS

## 2017-03-30 ENCOUNTER — Inpatient Hospital Stay (HOSPITAL_COMMUNITY)
Admission: EM | Admit: 2017-03-30 | Discharge: 2017-04-12 | DRG: 988 | Disposition: A | Discharge status: Hospice | Payer: Medicaid Other | Attending: Internal Medicine | Admitting: Internal Medicine

## 2017-03-30 ENCOUNTER — Encounter (HOSPITAL_COMMUNITY): Payer: Self-pay | Admitting: Radiology

## 2017-03-30 ENCOUNTER — Telehealth: Payer: Self-pay | Admitting: Internal Medicine

## 2017-03-30 ENCOUNTER — Emergency Department (HOSPITAL_COMMUNITY): Payer: Medicaid Other

## 2017-03-30 ENCOUNTER — Other Ambulatory Visit: Payer: Self-pay

## 2017-03-30 DIAGNOSIS — R0781 Pleurodynia: Secondary | ICD-10-CM | POA: Diagnosis not present

## 2017-03-30 DIAGNOSIS — N5089 Other specified disorders of the male genital organs: Secondary | ICD-10-CM | POA: Diagnosis present

## 2017-03-30 DIAGNOSIS — C8380 Other non-follicular lymphoma, unspecified site: Secondary | ICD-10-CM | POA: Diagnosis not present

## 2017-03-30 DIAGNOSIS — R0609 Other forms of dyspnea: Secondary | ICD-10-CM | POA: Diagnosis not present

## 2017-03-30 DIAGNOSIS — D649 Anemia, unspecified: Secondary | ICD-10-CM

## 2017-03-30 DIAGNOSIS — Z91030 Bee allergy status: Secondary | ICD-10-CM

## 2017-03-30 DIAGNOSIS — R609 Edema, unspecified: Secondary | ICD-10-CM | POA: Diagnosis not present

## 2017-03-30 DIAGNOSIS — N131 Hydronephrosis with ureteral stricture, not elsewhere classified: Secondary | ICD-10-CM | POA: Diagnosis present

## 2017-03-30 DIAGNOSIS — N1330 Unspecified hydronephrosis: Secondary | ICD-10-CM | POA: Diagnosis present

## 2017-03-30 DIAGNOSIS — C467 Kaposi's sarcoma of other sites: Secondary | ICD-10-CM | POA: Diagnosis present

## 2017-03-30 DIAGNOSIS — Z7951 Long term (current) use of inhaled steroids: Secondary | ICD-10-CM

## 2017-03-30 DIAGNOSIS — C469 Kaposi's sarcoma, unspecified: Secondary | ICD-10-CM | POA: Diagnosis not present

## 2017-03-30 DIAGNOSIS — Z515 Encounter for palliative care: Secondary | ICD-10-CM

## 2017-03-30 DIAGNOSIS — R0600 Dyspnea, unspecified: Secondary | ICD-10-CM

## 2017-03-30 DIAGNOSIS — J910 Malignant pleural effusion: Secondary | ICD-10-CM

## 2017-03-30 DIAGNOSIS — G4700 Insomnia, unspecified: Secondary | ICD-10-CM | POA: Diagnosis present

## 2017-03-30 DIAGNOSIS — Z7189 Other specified counseling: Secondary | ICD-10-CM | POA: Diagnosis not present

## 2017-03-30 DIAGNOSIS — E876 Hypokalemia: Secondary | ICD-10-CM | POA: Diagnosis present

## 2017-03-30 DIAGNOSIS — G893 Neoplasm related pain (acute) (chronic): Secondary | ICD-10-CM | POA: Diagnosis present

## 2017-03-30 DIAGNOSIS — R0603 Acute respiratory distress: Secondary | ICD-10-CM | POA: Diagnosis not present

## 2017-03-30 DIAGNOSIS — R600 Localized edema: Secondary | ICD-10-CM | POA: Diagnosis present

## 2017-03-30 DIAGNOSIS — T451X5A Adverse effect of antineoplastic and immunosuppressive drugs, initial encounter: Secondary | ICD-10-CM | POA: Diagnosis present

## 2017-03-30 DIAGNOSIS — Z66 Do not resuscitate: Secondary | ICD-10-CM | POA: Diagnosis present

## 2017-03-30 DIAGNOSIS — E8770 Fluid overload, unspecified: Secondary | ICD-10-CM | POA: Diagnosis present

## 2017-03-30 DIAGNOSIS — J90 Pleural effusion, not elsewhere classified: Secondary | ICD-10-CM | POA: Diagnosis not present

## 2017-03-30 DIAGNOSIS — E871 Hypo-osmolality and hyponatremia: Secondary | ICD-10-CM | POA: Diagnosis present

## 2017-03-30 DIAGNOSIS — I2699 Other pulmonary embolism without acute cor pulmonale: Secondary | ICD-10-CM | POA: Diagnosis not present

## 2017-03-30 DIAGNOSIS — M7989 Other specified soft tissue disorders: Secondary | ICD-10-CM | POA: Diagnosis not present

## 2017-03-30 DIAGNOSIS — R601 Generalized edema: Secondary | ICD-10-CM | POA: Diagnosis not present

## 2017-03-30 DIAGNOSIS — F419 Anxiety disorder, unspecified: Secondary | ICD-10-CM | POA: Diagnosis present

## 2017-03-30 DIAGNOSIS — Z87891 Personal history of nicotine dependence: Secondary | ICD-10-CM | POA: Diagnosis not present

## 2017-03-30 DIAGNOSIS — K219 Gastro-esophageal reflux disease without esophagitis: Secondary | ICD-10-CM | POA: Diagnosis present

## 2017-03-30 DIAGNOSIS — Z79899 Other long term (current) drug therapy: Secondary | ICD-10-CM

## 2017-03-30 DIAGNOSIS — R300 Dysuria: Secondary | ICD-10-CM | POA: Diagnosis present

## 2017-03-30 DIAGNOSIS — R627 Adult failure to thrive: Secondary | ICD-10-CM | POA: Diagnosis present

## 2017-03-30 DIAGNOSIS — R6510 Systemic inflammatory response syndrome (SIRS) of non-infectious origin without acute organ dysfunction: Secondary | ICD-10-CM | POA: Diagnosis not present

## 2017-03-30 DIAGNOSIS — J9811 Atelectasis: Secondary | ICD-10-CM | POA: Diagnosis present

## 2017-03-30 DIAGNOSIS — D6959 Other secondary thrombocytopenia: Secondary | ICD-10-CM | POA: Diagnosis present

## 2017-03-30 DIAGNOSIS — Z9889 Other specified postprocedural states: Secondary | ICD-10-CM

## 2017-03-30 DIAGNOSIS — D630 Anemia in neoplastic disease: Secondary | ICD-10-CM | POA: Diagnosis present

## 2017-03-30 DIAGNOSIS — N179 Acute kidney failure, unspecified: Secondary | ICD-10-CM | POA: Diagnosis present

## 2017-03-30 DIAGNOSIS — D509 Iron deficiency anemia, unspecified: Secondary | ICD-10-CM | POA: Diagnosis present

## 2017-03-30 DIAGNOSIS — D6481 Anemia due to antineoplastic chemotherapy: Secondary | ICD-10-CM | POA: Diagnosis present

## 2017-03-30 DIAGNOSIS — D696 Thrombocytopenia, unspecified: Secondary | ICD-10-CM

## 2017-03-30 DIAGNOSIS — B370 Candidal stomatitis: Secondary | ICD-10-CM | POA: Diagnosis not present

## 2017-03-30 DIAGNOSIS — R0789 Other chest pain: Secondary | ICD-10-CM | POA: Diagnosis not present

## 2017-03-30 LAB — BRAIN NATRIURETIC PEPTIDE: B Natriuretic Peptide: 30.6 pg/mL (ref 0.0–100.0)

## 2017-03-30 LAB — CBC WITH DIFFERENTIAL/PLATELET
Basophils Absolute: 0 10*3/uL (ref 0.0–0.1)
Basophils Relative: 1 %
EOS ABS: 0 10*3/uL (ref 0.0–0.7)
Eosinophils Relative: 0 %
HCT: 28.8 % — ABNORMAL LOW (ref 39.0–52.0)
HEMOGLOBIN: 9.2 g/dL — AB (ref 13.0–17.0)
LYMPHS ABS: 0.7 10*3/uL (ref 0.7–4.0)
Lymphocytes Relative: 15 %
MCH: 26.8 pg (ref 26.0–34.0)
MCHC: 31.9 g/dL (ref 30.0–36.0)
MCV: 84 fL (ref 78.0–100.0)
Monocytes Absolute: 0.8 10*3/uL (ref 0.1–1.0)
Monocytes Relative: 16 %
NEUTROS ABS: 3.3 10*3/uL (ref 1.7–7.7)
NEUTROS PCT: 68 %
Platelets: 107 10*3/uL — ABNORMAL LOW (ref 150–400)
RBC: 3.43 MIL/uL — AB (ref 4.22–5.81)
RDW: 17.8 % — ABNORMAL HIGH (ref 11.5–15.5)
WBC: 4.9 10*3/uL (ref 4.0–10.5)

## 2017-03-30 LAB — COMPREHENSIVE METABOLIC PANEL
ALBUMIN: 2.1 g/dL — AB (ref 3.5–5.0)
ALK PHOS: 56 U/L (ref 38–126)
ALT: 12 U/L — AB (ref 17–63)
AST: 34 U/L (ref 15–41)
Anion gap: 7 (ref 5–15)
BUN: 17 mg/dL (ref 6–20)
CALCIUM: 7.6 mg/dL — AB (ref 8.9–10.3)
CO2: 24 mmol/L (ref 22–32)
CREATININE: 1.02 mg/dL (ref 0.61–1.24)
Chloride: 100 mmol/L — ABNORMAL LOW (ref 101–111)
GFR calc non Af Amer: >60 mL/min (ref >60)
GLUCOSE: 138 mg/dL — AB (ref 65–99)
Potassium: 3.5 mmol/L (ref 3.5–5.1)
SODIUM: 131 mmol/L — AB (ref 135–145)
Total Bilirubin: 0.9 mg/dL (ref 0.3–1.2)
Total Protein: 5.5 g/dL — ABNORMAL LOW (ref 6.5–8.1)

## 2017-03-30 MED ORDER — HEPARIN (PORCINE) IN NACL 100-0.45 UNIT/ML-% IJ SOLN
1800.0000 [IU]/h | INTRAMUSCULAR | Status: DC
  Administered 2017-03-31: 1800 [IU]/h via INTRAVENOUS
  Filled 2017-03-30: qty 250

## 2017-03-30 MED ORDER — HYDROMORPHONE HCL 1 MG/ML IJ SOLN
1.0000 mg | INTRAMUSCULAR | Status: DC | PRN
  Administered 2017-03-31 – 2017-04-06 (×13): 1 mg via INTRAVENOUS
  Filled 2017-03-30 (×15): qty 1

## 2017-03-30 MED ORDER — IOPAMIDOL (ISOVUE-370) INJECTION 76%
INTRAVENOUS | Status: AC
  Administered 2017-03-30: 80 mL via INTRAVENOUS
  Filled 2017-03-30: qty 100

## 2017-03-30 MED ORDER — HEPARIN BOLUS VIA INFUSION
4000.0000 [IU] | Freq: Once | INTRAVENOUS | Status: AC
  Administered 2017-03-31: 4000 [IU] via INTRAVENOUS
  Filled 2017-03-30: qty 4000

## 2017-03-30 NOTE — Telephone Encounter (Signed)
Triage: please call mom back and let her know I d/w ER at [facility] and should go to ER     .................... Called mom 3:53 PM 03/30/2017 after she came to our office distraught. She says despite blood yesterday patient is declining, feeling horrible, ECOG 4, significant dysphagia, class 3-4 dyspnea. PRBC and lasix not helping. Significant edema +. Doxil on hold but patient declining  Advised that pace at which what sounds like a complex issue can be sorted out in ER. Likely needs admission. D/w Dr Shellee Milo of ER at [facility]  Dr. Brand Males, M.D., [facility].C.P Pulmonary and Critical Care Medicine Staff Physician, [place] Director - Interstitial Lung Disease  Program  Pulmonary [place] at [place], [place], [address]  Pager: [phone], If no answer or between  15:00h - 7:00h: call [phone] Telephone: [phone]

## 2017-03-30 NOTE — ED Provider Notes (Signed)
[facility] DEPT Provider Note   CSN: [id number] Arrival date & time: 03/30/17  1903     History   Chief Complaint No chief complaint on file.   HPI Victor Hayden is a 46 y.o. male.  The patient has Kaposi sarcoma and is having worsening swelling in his leg hurting in his legs weakness and shortness of breath.  He recently received a couple units of blood but this did not seem to help   The history is provided by the patient. No language interpreter was used.  Illness  This is a chronic problem. The current episode started more than 1 week ago. The problem occurs constantly. The problem has not changed since onset.Associated symptoms include shortness of breath. Pertinent negatives include no abdominal pain and no headaches. Nothing aggravates the symptoms. Nothing relieves the symptoms. He has tried acetaminophen for the symptoms. The treatment provided no relief.    Past Medical History:  Diagnosis Date  . kaposi sarcoma dx'd 08/2016    Patient Active Problem List   Diagnosis Date Noted  . Chest pain 03/30/2017  . Dysuria 03/30/2017  . Anemia 03/30/2017  . Thrombocytopenia ([place]) 03/30/2017  . Pulmonary embolism ([place]) 03/30/2017  . 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 Dyspnea and respiratory abnormalities 03/20/2017  . Neoplastic malignant related fatigue 03/20/2017  . Physical deconditioning 03/20/2017  . Goals of care, counseling/discussion 09/19/2016  . Kaposi sarcoma ([place]) 09/19/2016    No past surgical history on file.     Home Medications    Prior to Admission medications   Medication Sig Start Date End Date Taking? Authorizing Provider  albuterol (PROVENTIL HFA;VENTOLIN HFA) 108 (90 Base) MCG/ACT inhaler Inhale 2 puffs every 6 (six) hours as needed into the lungs for wheezing or shortness of breath. 03/15/17  Yes Saguier, Percell Miller, PA-C  beclomethasone (QVAR) 40 MCG/ACT inhaler Inhale 2 puffs into  the lungs 2 (two) times daily. 03/01/17  Yes Saguier, Percell Miller, PA-C  benzonatate (TESSALON) 100 MG capsule Take 1 capsule (100 mg total) by mouth 3 (three) times daily as needed for cough. 03/01/17  Yes Saguier, Percell Miller, PA-C  esomeprazole (NEXIUM) 20 MG capsule Take 20 mg daily at 12 noon by mouth.   Yes [provider]  furosemide (LASIX) 20 MG tablet Take 1 tablet (20 mg total) daily by mouth. 03/15/17  Yes Saguier, Percell Miller, PA-C  magic mouthwash w/lidocaine SOLN Take 5 mLs by mouth 4 (four) times daily as needed for mouth pain. 11/17/16  Yes Wyatt Portela, MD  nystatin cream (MYCOSTATIN) Apply 1 application topically 2 (two) times daily. 03/01/17  Yes Saguier, Percell Miller, PA-C  prochlorperazine (COMPAZINE) 10 MG tablet Take 1 tablet (10 mg total) by mouth every 6 (six) hours as needed for nausea or vomiting. 09/19/16  Yes Shadad, Mathis Dad, MD  sucralfate (CARAFATE) 1 GM/10ML suspension Take 10 mLs (1 g total) by mouth 4 (four) times daily -  with meals and at bedtime. 03/23/17  Yes Wyatt Portela, MD    Family History No family history on file.  Social History Social History   Tobacco Use  . Smoking status: Former Smoker    Packs/day: 1.00    Years: 29.00    Pack years: 29.00    Types: Cigarettes    Start date: 1988    Last attempt to quit: 01/18/2017    Years since quitting: 0.1  . Smokeless tobacco: Never Used  Substance Use Topics  . Alcohol use: No  .  Drug use: No     Allergies   Bee venom   Review of Systems Review of Systems  Constitutional: Negative for appetite change and fatigue.  HENT: Negative for congestion, ear discharge and sinus pressure.   Eyes: Negative for discharge.  Respiratory: Positive for shortness of breath. Negative for cough.   Gastrointestinal: Negative for abdominal pain and diarrhea.  Genitourinary: Negative for frequency and hematuria.  Musculoskeletal: Negative for back pain.       Severe edema in legs with pain.  Skin: Negative for rash.   Neurological: Negative for seizures and headaches.  Psychiatric/Behavioral: Negative for hallucinations.     Physical Exam Updated Vital Signs BP 110/60   Pulse (!) 116   Temp 97.9 F (36.6 C) (Oral)   Resp (!) 32   Ht 5\' 10"  (1.778 m)   Wt 93 kg (205 lb)   SpO2 97%   BMI 29.41 kg/m   Physical Exam  Constitutional: He is oriented to person, place, and time. He appears distressed.  HENT:  Head: Normocephalic.  Eyes: Conjunctivae and EOM are normal. No scleral icterus.  Neck: Neck supple. No thyromegaly present.  Cardiovascular: Regular rhythm. Exam reveals no gallop and no friction rub.  No murmur heard. Tachycardia  Pulmonary/Chest: No stridor. He has no wheezes. He has no rales. He exhibits no tenderness.  Tachypnea  Abdominal: He exhibits no distension. There is no tenderness. There is no rebound.  Musculoskeletal: Normal range of motion. He exhibits no edema.  Severe edema and legs with discoloration of the thighs.  Legs are extremely tender  Lymphadenopathy:    He has no cervical adenopathy.  Neurological: He is oriented to person, place, and time. He exhibits normal muscle tone. Coordination normal.  Skin: No rash noted. No erythema.  Psychiatric: He has a normal mood and affect. His behavior is normal.     ED Treatments / Results  Labs (all labs ordered are listed, but only abnormal results are displayed) Labs Reviewed  CBC WITH DIFFERENTIAL/PLATELET - Abnormal; Notable for the following components:      Result Value   RBC 3.43 (*)    Hemoglobin 9.2 (*)    HCT 28.8 (*)    RDW 17.8 (*)    Platelets 107 (*)    All other components within normal limits  COMPREHENSIVE METABOLIC PANEL - Abnormal; Notable for the following components:   Sodium 131 (*)    Chloride 100 (*)    Glucose, Bld 138 (*)    Calcium 7.6 (*)    Total Protein 5.5 (*)    Albumin 2.1 (*)    ALT 12 (*)    All other components within normal limits  URINE CULTURE  BRAIN NATRIURETIC  PEPTIDE  URINALYSIS, ROUTINE W REFLEX MICROSCOPIC  TROPONIN I    EKG  EKG Interpretation None       Radiology Ct Angio Chest Pe W And/or Wo Contrast  Result Date: 03/30/2017 CLINICAL DATA:  Chronic lower leg soft-tissue necrosis. Shortness of breath. EXAM: CT ANGIOGRAPHY CHEST WITH CONTRAST TECHNIQUE: Multidetector CT imaging of the chest was performed using the standard protocol during bolus administration of intravenous contrast. Multiplanar CT image reconstructions and MIPs were obtained to evaluate the vascular anatomy. CONTRAST:  80 mL of Isovue 370 COMPARISON:  Chest x-ray from earlier today. Chest CT February 07, 2017. FINDINGS: Cardiovascular: The thoracic aorta is normal in caliber with no dissection. The heart size is normal. The coronary arteries are unremarkable. Evaluation of the pulmonary arteries is  severely limited due to respiratory motion, artifact, and timing of the contrast bolus. However, I am suspicious there are emboli such as on coronal image 68 in the left upper lobe. Numerous other regions of decreased attenuation are seen in the pulmonary arteries. I suspect much of this is artifact but findings are also concerning for additional emboli. Another possible embolus is seen on the right on coronal image 72. Mediastinum/Nodes: Mild nodularity in the thyroid. The esophagus is unremarkable. The increased soft tissue in stranding in the anterior and middle mediastinum is similar in the interval. There is a prominent right paratracheal node on series 4, image 27 measuring 12 mm. No hilar adenopathy. There is increasing fat stranding in the bilateral axilla, left greater than right with numerous shotty nodes which have increased. Subcutaneous edema seen in the chest, well seen on the right on image 69. A small pericardial effusion is identified. Lungs/Pleura: The central airways are normal. No pneumothorax. Bilateral pulmonary infiltrates are identified presenting primarily as ill  defined alveolar and nodular opacities. A nodular component on series 11, image 38 measure up to 14 mm. Mild interlobular septal thickening is seen in some regions. Left greater than right pleural effusions are seen. Opacities associated with the effusions may simply represent compressive atelectasis. Upper Abdomen: There is increased attenuation in the fat in the gastrohepatic ligament and periceliac region. Mild caliectasis in the upper pole the left kidney. No other abnormalities in the upper abdomen. Musculoskeletal: The bones are stable. Lucency in the left T9 vertebral body is stable. Review of the MIP images confirms the above findings. IMPRESSION: 1. Evaluation for pulmonary emboli is severely limited as above. However, findings are suspicious but not definitive for emboli as above. 2. The ill defined alveolar and nodular opacities with bilateral effusions and some interlobular septal thickening is nonspecific. Kaposi's sarcoma involving the lungs could have this appearance. However, infectious processes are also possible. 3. Extensive soft tissue stranding throughout the anterior and middle mediastinum is stable. There is a similar process in the bilateral axilla and upper abdomen which is also new. The underlying etiology of these findings is unclear. Volume overload is a possibility. A process secondary to the patient's Kaposi sarcoma is not excluded but the findings are unusual. 4. Caliectasis in the upper pole left kidney is incompletely evaluated. Findings discussed with Dr. Roderic Palau Electronically Signed   By: Dorise Bullion III M.D   On: 03/30/2017 22:36   Dg Chest Port 1 View  Result Date: 03/30/2017 CLINICAL DATA:  Marked weakness. Shortness of breath. Currently being treated for Kaposi sarcoma. EXAM: PORTABLE CHEST 1 VIEW COMPARISON:  03/21/2017. FINDINGS: Stable enlarged cardiac silhouette. Mild increase in prominence of the interstitial markings in the lower lung zones with a stable small  right pleural effusion and interval minimal left pleural effusion. Interval minimal nodularity at the right lung base laterally. Unremarkable bones. IMPRESSION: Mild worsening of changes of congestive heart failure. There is interval minimal nodularity at the right lung base, possibly representing small foci of infection. Electronically Signed   By: Claudie Revering M.D.   On: 03/30/2017 20:53    Procedures Procedures (including critical care time)  Medications Ordered in ED Medications  HYDROmorphone (DILAUDID) injection 1 mg (not administered)  iopamidol (ISOVUE-370) 76 % injection (80 mLs Intravenous Contrast Given 03/30/17 2146)     Initial Impression / Assessment and Plan / ED Course  I have reviewed the triage vital signs and the nursing notes.  Pertinent labs & imaging results that  were available during my care of the patient were reviewed by me and considered in my medical decision making (see chart for details). CRITICAL CARE Performed by: Milton Ferguson Total critical care time: 40 minutes Critical care time was exclusive of separately billable procedures and treating other patients. Critical care was necessary to treat or prevent imminent or life-threatening deterioration. Critical care was time spent personally by me on the following activities: development of treatment plan with patient and/or surrogate as well as nursing, discussions with consultants, evaluation of patient's response to treatment, examination of patient, obtaining history from patient or surrogate, ordering and performing treatments and interventions, ordering and review of laboratory studies, ordering and review of radiographic studies, pulse oximetry and re-evaluation of patient's condition. \Patient is    Patient with shortness of breath and severe pain in his legs with swelling.  CT angios suggest worsening of Kaposi sarcoma in his chest his abdomen.  Questionable PE.  Oncology has been consulted and will  evaluate the patient the morning but just supportive care for now.  Patient will be admitted to medicine  Final Clinical Impressions(s) / ED Diagnoses   Final diagnoses:  Kaposi's sarcoma [facility])    ED Discharge Orders    None       Milton Ferguson, MD 03/30/17 2343

## 2017-03-30 NOTE — Telephone Encounter (Signed)
Called and spoke with Jeani Hawking stating that pt needs to go to the ER.  Jeani Hawking expressed understanding and stated that she would take her son straight there.  Nothing further needed.

## 2017-03-30 NOTE — H&P (Signed)
History and Physical    Victor Hayden:[id number] DOB: 1970/08/03 DOA: 03/30/2017  PCP: Mackie Pai, PA-C  Patient coming from: home    Chief Complaint: chest pain  HPI: Victor Hayden is a 46 y.o. male with medical history significant of rapidly worsening widespred classic kaposi sarcoma (hiv neg) presenting with worsening midline chest pain worse with breathing and movement. Also worsening GERD symptoms. And also with new burning with urination, no fever or frequency or hesitancy. Says LE edema also worsening, now is up to abdomen. Does not think chest pain is exertional. Main reason for coming is worsening pain. Doxil treatment recently stopped by oncology given worsening symptoms and concern (including concern by pulmonology) that may be contributing to worsening symptoms.  ED Course: labs, CTA chest  Review of Systems: As per HPI otherwise 10 point review of systems negative.    Past Medical History:  Diagnosis Date  . kaposi sarcoma dx'd 08/2016    No past surgical history on file.   reports that he quit smoking about 2 months ago. His smoking use included cigarettes. He started smoking about 30 years ago. He has a 29.00 pack-year smoking history. he has never used smokeless tobacco. He reports that he does not drink alcohol or use drugs.  Allergies  Allergen Reactions  . Bee Venom Anaphylaxis    No family history on file.   Prior to Admission medications   Medication Sig Start Date End Date Taking? Authorizing Provider  albuterol (PROVENTIL HFA;VENTOLIN HFA) 108 (90 Base) MCG/ACT inhaler Inhale 2 puffs every 6 (six) hours as needed into the lungs for wheezing or shortness of breath. 03/15/17  Yes Saguier, Percell Miller, PA-C  beclomethasone (QVAR) 40 MCG/ACT inhaler Inhale 2 puffs into the lungs 2 (two) times daily. 03/01/17  Yes Saguier, Percell Miller, PA-C  benzonatate (TESSALON) 100 MG capsule Take 1 capsule (100 mg total) by mouth 3 (three) times daily as  needed for cough. 03/01/17  Yes Saguier, Percell Miller, PA-C  esomeprazole (NEXIUM) 20 MG capsule Take 20 mg daily at 12 noon by mouth.   Yes [provider]  furosemide (LASIX) 20 MG tablet Take 1 tablet (20 mg total) daily by mouth. 03/15/17  Yes Saguier, Percell Miller, PA-C  magic mouthwash w/lidocaine SOLN Take 5 mLs by mouth 4 (four) times daily as needed for mouth pain. 11/17/16  Yes Wyatt Portela, MD  nystatin cream (MYCOSTATIN) Apply 1 application topically 2 (two) times daily. 03/01/17  Yes Saguier, Percell Miller, PA-C  prochlorperazine (COMPAZINE) 10 MG tablet Take 1 tablet (10 mg total) by mouth every 6 (six) hours as needed for nausea or vomiting. 09/19/16  Yes Shadad, Mathis Dad, MD  sucralfate (CARAFATE) 1 GM/10ML suspension Take 10 mLs (1 g total) by mouth 4 (four) times daily -  with meals and at bedtime. 03/23/17  Yes Wyatt Portela, MD    Physical Exam: Vitals:   03/30/17 2011 03/30/17 2042 03/30/17 2114 03/30/17 2323  BP: 134/78 121/60 102/82 110/60  Pulse: (!) 112 (!) 114 (!) 117 (!) 116  Resp: (!) 33 (!) 35 (!) 33 (!) 32  Temp:      TempSrc:      SpO2: 98% 97% 95% 97%  Weight:      Height:        Constitutional: NAD, calm, comfortable Vitals:   03/30/17 2011 03/30/17 2042 03/30/17 2114 03/30/17 2323  BP: 134/78 121/60 102/82 110/60  Pulse: (!) 112 (!) 114 (!) 117 (!) 116  Resp: (!) 33 (!) 35 Marland Kitchen)  33 (!) 32  Temp:      TempSrc:      SpO2: 98% 97% 95% 97%  Weight:      Height:       Chronically ill appearing, appears in pain Eyes: PERRL, lids and conjunctivae normal ENMT: Mucous membranes are dry. Neck: normal, supple, Respiratory: scattered rhonchi throughout Cardiovascular: tachycardic, distant heart sounds, no murmur. Significant pitting edema to lower abdomen Abdomen: no tenderness, mild edema, no rebound or guarding GU: scrotal swelling, normal penis and meatus Musculoskeletal: no clubbing / cyanosis.  Skin: extensive erythematous plaques most noticeable on lower  extremities Neurologic: moving all 4 extremities  Psychiatric: Normal judgment and insight. Alert and oriented x 3. Normal mood. Upset   Labs on Admission: I have personally reviewed following labs and imaging studies  CBC: Recent Labs  Lab 03/30/17 2014  WBC 4.9  NEUTROABS 3.3  HGB 9.2*  HCT 28.8*  MCV 84.0  PLT 132*   Basic Metabolic Panel: Recent Labs  Lab 03/30/17 2014  NA 131*  K 3.5  CL 100*  CO2 24  GLUCOSE 138*  BUN 17  CREATININE 1.02  CALCIUM 7.6*   GFR: Estimated Creatinine Clearance: 101.5 mL/min (by C-G formula based on SCr of 1.02 mg/dL). Liver Function Tests: Recent Labs  Lab 03/30/17 2014  AST 34  ALT 12*  ALKPHOS 56  BILITOT 0.9  PROT 5.5*  ALBUMIN 2.1*   No results for input(s): LIPASE, AMYLASE in the last 168 hours. No results for input(s): AMMONIA in the last 168 hours. Coagulation Profile: No results for input(s): INR, PROTIME in the last 168 hours. Cardiac Enzymes: No results for input(s): CKTOTAL, CKMB, CKMBINDEX, TROPONINI in the last 168 hours. BNP (last 3 results) Recent Labs    03/01/17 1036  PROBNP 10.0   HbA1C: No results for input(s): HGBA1C in the last 72 hours. CBG: No results for input(s): GLUCAP in the last 168 hours. Lipid Profile: No results for input(s): CHOL, HDL, LDLCALC, TRIG, CHOLHDL, LDLDIRECT in the last 72 hours. Thyroid Function Tests: No results for input(s): TSH, T4TOTAL, FREET4, T3FREE, THYROIDAB in the last 72 hours. Anemia Panel: No results for input(s): VITAMINB12, FOLATE, FERRITIN, TIBC, IRON, RETICCTPCT in the last 72 hours. Urine analysis: No results found for: COLORURINE, APPEARANCEUR, [place], [place], GLUCOSEU, HGBUR, BILIRUBINUR, KETONESUR, PROTEINUR, UROBILINOGEN, NITRITE, LEUKOCYTESUR  Radiological Exams on Admission: Ct Angio Chest Pe W And/or Wo Contrast  Result Date: 03/30/2017 CLINICAL DATA:  Chronic lower leg soft-tissue necrosis. Shortness of breath. EXAM: CT ANGIOGRAPHY CHEST  WITH CONTRAST TECHNIQUE: Multidetector CT imaging of the chest was performed using the standard protocol during bolus administration of intravenous contrast. Multiplanar CT image reconstructions and MIPs were obtained to evaluate the vascular anatomy. CONTRAST:  80 mL of Isovue 370 COMPARISON:  Chest x-ray from earlier today. Chest CT February 07, 2017. FINDINGS: Cardiovascular: The thoracic aorta is normal in caliber with no dissection. The heart size is normal. The coronary arteries are unremarkable. Evaluation of the pulmonary arteries is severely limited due to respiratory motion, artifact, and timing of the contrast bolus. However, I am suspicious there are emboli such as on coronal image 68 in the left upper lobe. Numerous other regions of decreased attenuation are seen in the pulmonary arteries. I suspect much of this is artifact but findings are also concerning for additional emboli. Another possible embolus is seen on the right on coronal image 72. Mediastinum/Nodes: Mild nodularity in the thyroid. The esophagus is unremarkable. The increased soft tissue in stranding  in the anterior and middle mediastinum is similar in the interval. There is a prominent right paratracheal node on series 4, image 27 measuring 12 mm. No hilar adenopathy. There is increasing fat stranding in the bilateral axilla, left greater than right with numerous shotty nodes which have increased. Subcutaneous edema seen in the chest, well seen on the right on image 69. A small pericardial effusion is identified. Lungs/Pleura: The central airways are normal. No pneumothorax. Bilateral pulmonary infiltrates are identified presenting primarily as ill defined alveolar and nodular opacities. A nodular component on series 11, image 38 measure up to 14 mm. Mild interlobular septal thickening is seen in some regions. Left greater than right pleural effusions are seen. Opacities associated with the effusions may simply represent compressive  atelectasis. Upper Abdomen: There is increased attenuation in the fat in the gastrohepatic ligament and periceliac region. Mild caliectasis in the upper pole the left kidney. No other abnormalities in the upper abdomen. Musculoskeletal: The bones are stable. Lucency in the left T9 vertebral body is stable. Review of the MIP images confirms the above findings. IMPRESSION: 1. Evaluation for pulmonary emboli is severely limited as above. However, findings are suspicious but not definitive for emboli as above. 2. The ill defined alveolar and nodular opacities with bilateral effusions and some interlobular septal thickening is nonspecific. Kaposi's sarcoma involving the lungs could have this appearance. However, infectious processes are also possible. 3. Extensive soft tissue stranding throughout the anterior and middle mediastinum is stable. There is a similar process in the bilateral axilla and upper abdomen which is also new. The underlying etiology of these findings is unclear. Volume overload is a possibility. A process secondary to the patient's Kaposi sarcoma is not excluded but the findings are unusual. 4. Caliectasis in the upper pole left kidney is incompletely evaluated. Findings discussed with Dr. Roderic Palau Electronically Signed   By: Dorise Bullion III M.D   On: 03/30/2017 22:36   Dg Chest Port 1 View  Result Date: 03/30/2017 CLINICAL DATA:  Marked weakness. Shortness of breath. Currently being treated for Kaposi sarcoma. EXAM: PORTABLE CHEST 1 VIEW COMPARISON:  03/21/2017. FINDINGS: Stable enlarged cardiac silhouette. Mild increase in prominence of the interstitial markings in the lower lung zones with a stable small right pleural effusion and interval minimal left pleural effusion. Interval minimal nodularity at the right lung base laterally. Unremarkable bones. IMPRESSION: Mild worsening of changes of congestive heart failure. There is interval minimal nodularity at the right lung base, possibly  representing small foci of infection. Electronically Signed   By: Claudie Revering M.D.   On: 03/30/2017 20:53    EKG: Independently reviewed. Tachycardic, otherwise normal  Assessment/Plan Active Problems:   Kaposi sarcoma (HCC)   Chest pain   Dysuria   Anemia   Thrombocytopenia ([facility])   Pulmonary embolism ([facility])   Kaposi's sarcoma of multiple organs ([place])  # Chest pain # Kaposi sarcoma - acute worsening of previous indolent court, with multi-system involvement including known mediastinal involvment. Had CTA 1.5 months ago that showed no PE; now poor quality CTA is suspicious for PE. Pleuritic pain and tachypnea certainly could be 2/2 PE, and I do judge this patient to be at higher than normal risk for PE. - will start PE protocol heparin overnight and await oncology recs in AM. If presentation seems consistent with known kaposi sarcoma sequelae, would be very reasonable to stop. Discussed this w/ pt and his mother, including risks and benefits, and they very much agree with starting heparin.  Favor heparin over lovenox as the latter might be poorly absorbed given patient's significant edema. - dilaudid for pain - ECG non-ischemic; will f/u troponin - continue home ppi, sucralfate, magic mouthwash, compazine prn, qvar, albuterol - appreciate oncology recs in AM  # Dysuria - possibly 2/2 edema - f/u u/a, urine culture, and gonorrhea/chlamydia   # Anemia # Thrombocytopenia - recently had prbc transfusion, H 9.2 which is improved from most recently. Possible doxil side effect, underlying disease may also be a cause    DVT prophylaxis: anticoagulated Code Status: DNR, confirmed w/ pt and his mother Family Communication: mother [name] [phone] Disposition Plan: home Consults called: Oncology Admission status: med/surg   Desma Maxim MD [facility] Pager [phone]  If 7PM-7AM, please contact night-coverage [email or web address] Password [email or web address]  03/30/2017, 11:40 PM

## 2017-03-30 NOTE — Telephone Encounter (Signed)
Patient mother Ivin Booty in the lobby very upset - Mother states that patient's esophagus is swollen and unable to breathe and is unable to eat -she also states patient had infusion done on Wednesday and if patient is unable to eat the infusion will not be useful. She is most concerned about his lack of ability to breathe. She can be reached at [phone] - [name]. There was no one in triage - so I went to get Joellen Jersey, and MR was nearby - MR states that he will review the patient's record and have patient's mother contacted. Patient mother also asking for a medication that could possibly help his condition, to help him breathe and eat. Patient uses [facility]

## 2017-03-31 DIAGNOSIS — R0789 Other chest pain: Secondary | ICD-10-CM

## 2017-03-31 DIAGNOSIS — R0609 Other forms of dyspnea: Secondary | ICD-10-CM

## 2017-03-31 DIAGNOSIS — G4700 Insomnia, unspecified: Secondary | ICD-10-CM

## 2017-03-31 DIAGNOSIS — D6481 Anemia due to antineoplastic chemotherapy: Secondary | ICD-10-CM

## 2017-03-31 DIAGNOSIS — C469 Kaposi's sarcoma, unspecified: Principal | ICD-10-CM

## 2017-03-31 DIAGNOSIS — D6959 Other secondary thrombocytopenia: Secondary | ICD-10-CM

## 2017-03-31 LAB — PROTIME-INR
INR: 1.08
Prothrombin Time: 13.9 seconds (ref 11.4–15.2)

## 2017-03-31 LAB — CBC
HEMATOCRIT: 26.9 % — AB (ref 39.0–52.0)
Hemoglobin: 8.8 g/dL — ABNORMAL LOW (ref 13.0–17.0)
MCH: 27.5 pg (ref 26.0–34.0)
MCHC: 32.7 g/dL (ref 30.0–36.0)
MCV: 84.1 fL (ref 78.0–100.0)
Platelets: 91 10*3/uL — ABNORMAL LOW (ref 150–400)
RBC: 3.2 MIL/uL — AB (ref 4.22–5.81)
RDW: 18.1 % — AB (ref 11.5–15.5)
WBC: 4.7 10*3/uL (ref 4.0–10.5)

## 2017-03-31 LAB — HEPARIN LEVEL (UNFRACTIONATED)
HEPARIN UNFRACTIONATED: 0.15 [IU]/mL — AB (ref 0.30–0.70)
Heparin Unfractionated: 0.23 IU/mL — ABNORMAL LOW (ref 0.30–0.70)

## 2017-03-31 LAB — URINALYSIS, ROUTINE W REFLEX MICROSCOPIC
Bilirubin Urine: NEGATIVE
GLUCOSE, UA: NEGATIVE mg/dL
HGB URINE DIPSTICK: NEGATIVE
Ketones, ur: NEGATIVE mg/dL
Leukocytes, UA: NEGATIVE
Nitrite: NEGATIVE
Protein, ur: NEGATIVE mg/dL
SPECIFIC GRAVITY, URINE: 1.027 (ref 1.005–1.030)
pH: 6 (ref 5.0–8.0)

## 2017-03-31 LAB — TROPONIN I: Troponin I: <0.03 ng/mL (ref <0.03)

## 2017-03-31 LAB — APTT: APTT: 28 s (ref 24–36)

## 2017-03-31 MED ORDER — FUROSEMIDE 20 MG PO TABS
20.0000 mg | ORAL_TABLET | Freq: Every day | ORAL | Status: DC
  Administered 2017-03-31: 20 mg via ORAL
  Filled 2017-03-31: qty 1

## 2017-03-31 MED ORDER — BENZONATATE 100 MG PO CAPS
100.0000 mg | ORAL_CAPSULE | Freq: Three times a day (TID) | ORAL | Status: DC | PRN
  Administered 2017-04-01 – 2017-04-10 (×6): 100 mg via ORAL
  Filled 2017-03-31 (×7): qty 1

## 2017-03-31 MED ORDER — FUROSEMIDE 10 MG/ML IJ SOLN
40.0000 mg | Freq: Once | INTRAMUSCULAR | Status: AC
Start: 2017-03-31 — End: 2017-03-31
  Administered 2017-03-31: 40 mg via INTRAVENOUS
  Filled 2017-03-31: qty 4

## 2017-03-31 MED ORDER — HEPARIN (PORCINE) IN NACL 100-0.45 UNIT/ML-% IJ SOLN
1500.0000 [IU]/h | INTRAMUSCULAR | Status: DC
  Administered 2017-03-31: 1500 [IU]/h via INTRAVENOUS
  Filled 2017-03-31: qty 250

## 2017-03-31 MED ORDER — MORPHINE SULFATE ER 15 MG PO TBCR
15.0000 mg | EXTENDED_RELEASE_TABLET | Freq: Two times a day (BID) | ORAL | Status: DC
  Administered 2017-03-31 – 2017-04-09 (×19): 15 mg via ORAL
  Filled 2017-03-31 (×19): qty 1

## 2017-03-31 MED ORDER — FUROSEMIDE 10 MG/ML IJ SOLN
40.0000 mg | Freq: Two times a day (BID) | INTRAMUSCULAR | Status: DC
Start: 2017-04-01 — End: 2017-04-02
  Administered 2017-04-01 (×2): 40 mg via INTRAVENOUS
  Filled 2017-03-31 (×2): qty 4

## 2017-03-31 MED ORDER — MAGIC MOUTHWASH W/LIDOCAINE
5.0000 mL | Freq: Four times a day (QID) | ORAL | Status: DC | PRN
  Filled 2017-03-31: qty 5

## 2017-03-31 MED ORDER — BUDESONIDE 0.25 MG/2ML IN SUSP
0.2500 mg | Freq: Two times a day (BID) | RESPIRATORY_TRACT | Status: DC
  Administered 2017-03-31 – 2017-04-11 (×20): 0.25 mg via RESPIRATORY_TRACT
  Filled 2017-03-31 (×23): qty 2

## 2017-03-31 MED ORDER — HEPARIN (PORCINE) IN NACL 100-0.45 UNIT/ML-% IJ SOLN
1800.0000 [IU]/h | INTRAMUSCULAR | Status: DC
  Administered 2017-03-31: 1800 [IU]/h via INTRAVENOUS
  Filled 2017-03-31 (×2): qty 250

## 2017-03-31 MED ORDER — PANTOPRAZOLE SODIUM 40 MG PO TBEC
40.0000 mg | DELAYED_RELEASE_TABLET | Freq: Every day | ORAL | Status: DC
  Administered 2017-03-31 – 2017-04-11 (×12): 40 mg via ORAL
  Filled 2017-03-31 (×13): qty 1

## 2017-03-31 MED ORDER — ALBUTEROL SULFATE (2.5 MG/3ML) 0.083% IN NEBU
3.0000 mL | INHALATION_SOLUTION | Freq: Four times a day (QID) | RESPIRATORY_TRACT | Status: DC | PRN
  Filled 2017-03-31: qty 3

## 2017-03-31 MED ORDER — PROCHLORPERAZINE MALEATE 10 MG PO TABS: 10.0000 mg | ORAL_TABLET | Freq: Four times a day (QID) | ORAL | Status: DC | PRN

## 2017-03-31 MED ORDER — BECLOMETHASONE DIPROP HFA 40 MCG/ACT IN AERB: 2.0000 | INHALATION_SPRAY | Freq: Two times a day (BID) | RESPIRATORY_TRACT | Status: DC

## 2017-03-31 MED ORDER — SENNOSIDES-DOCUSATE SODIUM 8.6-50 MG PO TABS
1.0000 | ORAL_TABLET | Freq: Two times a day (BID) | ORAL | Status: DC
  Administered 2017-03-31 – 2017-04-11 (×16): 1 via ORAL
  Filled 2017-03-31 (×20): qty 1

## 2017-03-31 MED ORDER — HEPARIN (PORCINE) IN NACL 100-0.45 UNIT/ML-% IJ SOLN
2000.0000 [IU]/h | INTRAMUSCULAR | Status: DC
  Filled 2017-03-31 (×2): qty 250

## 2017-03-31 MED ORDER — SUCRALFATE 1 GM/10ML PO SUSP
1.0000 g | Freq: Three times a day (TID) | ORAL | Status: DC
  Administered 2017-03-31 – 2017-04-12 (×35): 1 g via ORAL
  Filled 2017-03-31 (×38): qty 10

## 2017-03-31 MED ORDER — POLYETHYLENE GLYCOL 3350 17 G PO PACK
17.0000 g | PACK | Freq: Every day | ORAL | Status: DC
  Administered 2017-04-01 – 2017-04-11 (×6): 17 g via ORAL
  Filled 2017-03-31 (×9): qty 1

## 2017-03-31 MED ORDER — DOCUSATE SODIUM 100 MG PO CAPS
100.0000 mg | ORAL_CAPSULE | Freq: Two times a day (BID) | ORAL | Status: DC
  Administered 2017-03-31 – 2017-04-11 (×17): 100 mg via ORAL
  Filled 2017-03-31 (×22): qty 1

## 2017-03-31 NOTE — ED Notes (Signed)
Assigned 1330 @ 7:12 call report @ 7:34

## 2017-03-31 NOTE — Progress Notes (Signed)
Patients saturations recheck after initiation of oxygen at 3 lpm.  Saturations now reading 93%.  RN notified and will continue to monitor.

## 2017-03-31 NOTE — Progress Notes (Signed)
Skin assessment, Legs are edematous and  taunt, Legs are discolored  A dark purplish color. Feet are edematous with positive pedal pulses. Marland KitchenPurplish areas all over his chest and abdomen. Lighter color on his back

## 2017-03-31 NOTE — Progress Notes (Signed)
Victor Hayden   DOB:1970-07-14   JW#:[id number]   FAO#:[id number]  Oncology follow-up  Subjective: I am covering Dr. Alen Blew to see patient this weekend.  He was admitted for worsening chest pain and dyspnea.  He has been on chemotherapy Doxil for metastatic Kaposi's sarcoma, had partial response.  Chemotherapy was held on his last follow-up visit on March 23, 2017, due to his worsening anemia and other symptoms.  He was recently also referred to pulmonologist Dr. Chase Caller, his respiratory symptoms.  His recent echo was normal.  His last chemo was 5 weeks ago.  Objective:  Vitals:   03/31/17 0814 03/31/17 1303  BP: 115/69 111/67  Pulse: (!) 109 (!) 105  Resp: 20 20  Temp: 99.4 F (37.4 C) 98 F (36.7 C)  SpO2: 92% 95%    Body mass index is 29.41 kg/m.  Intake/Output Summary (Last 24 hours) at 03/31/2017 1724 Last data filed at 03/31/2017 1500 Gross per 24 hour  Intake 3.9 ml  Output 450 ml  Net -446.1 ml     Sclerae unicteric  Oropharynx clear  No peripheral adenopathy  Lungs clear --diffuse rales on b/l lung bases   Heart regular rate and rhythm  Abdomen benign  MSK no focal spinal tenderness, no peripheral edema  Neuro nonfocal  Skin: Diffuse nodular skin lesions with hyperpigmentation on his extremities and trunk, diffuse confluent hyperpigmentation with skin thickness in bilateral thigh   CBG (last 3)  No results for input(s): GLUCAP in the last 72 hours.   Labs:  Lab Results  Component Value Date   WBC 4.7 03/31/2017   HGB 8.8 (L) 03/31/2017   HCT 26.9 (L) 03/31/2017   MCV 84.1 03/31/2017   PLT 91 (L) 03/31/2017   NEUTROABS 3.3 03/30/2017   CMP Latest Ref Rng & Units 03/30/2017 03/23/2017 03/01/2017  Glucose 65 - 99 mg/dL 138(H) 91 95  BUN 6 - 20 mg/dL 17 17.8 21  Creatinine 0.61 - 1.24 mg/dL 1.02 1.0 0.95  Sodium 135 - 145 mmol/L 131(L) 134(L) 132(L)  Potassium 3.5 - 5.1 mmol/L 3.5 3.5 3.8  Chloride 101 - 111 mmol/L 100(L) - 98  CO2 22 - 32  mmol/L 24 21(L) 26  Calcium 8.9 - 10.3 mg/dL 7.6(L) 8.6 9.0  Total Protein 6.5 - 8.1 g/dL 5.5(L) 6.8 7.5  Total Bilirubin 0.3 - 1.2 mg/dL 0.9 0.99 0.5  Alkaline Phos 38 - 126 U/L 56 67 73  AST 15 - 41 U/L 34 31 18  ALT 17 - 63 U/L 12(L) 9 9     Urine Studies No results for input(s): UHGB, CRYS in the last 72 hours.  Invalid input(s): UACOL, UAPR, USPG, UPH, UTP, UGL, UKET, UBIL, UNIT, UROB, ULEU, UEPI, UWBC, URBC, UBAC, CAST, UCOM, BILUA  Basic Metabolic Panel: Recent Labs  Lab 03/30/17 2014  NA 131*  K 3.5  CL 100*  CO2 24  GLUCOSE 138*  BUN 17  CREATININE 1.02  CALCIUM 7.6*   GFR Estimated Creatinine Clearance: 101.5 mL/min (by C-G formula based on SCr of 1.02 mg/dL). Liver Function Tests: Recent Labs  Lab 03/30/17 2014  AST 34  ALT 12*  ALKPHOS 56  BILITOT 0.9  PROT 5.5*  ALBUMIN 2.1*   No results for input(s): LIPASE, AMYLASE in the last 168 hours. No results for input(s): AMMONIA in the last 168 hours. Coagulation profile Recent Labs  Lab 03/30/17 2014  INR 1.08    CBC: Recent Labs  Lab 03/30/17 2014 03/31/17 0824  WBC 4.9 4.7  NEUTROABS 3.3  --   HGB 9.2* 8.8*  HCT 28.8* 26.9*  MCV 84.0 84.1  PLT 107* 91*   Cardiac Enzymes: Recent Labs  Lab 03/31/17 0049  TROPONINI <0.03   BNP: Invalid input(s): POCBNP CBG: No results for input(s): GLUCAP in the last 168 hours. D-Dimer No results for input(s): DDIMER in the last 72 hours. Hgb A1c No results for input(s): HGBA1C in the last 72 hours. Lipid Profile No results for input(s): CHOL, HDL, LDLCALC, TRIG, CHOLHDL, LDLDIRECT in the last 72 hours. Thyroid function studies No results for input(s): TSH, T4TOTAL, T3FREE, THYROIDAB in the last 72 hours.  Invalid input(s): FREET3 Anemia work up No results for input(s): VITAMINB12, FOLATE, FERRITIN, TIBC, IRON, RETICCTPCT in the last 72 hours. Microbiology No results found for this or any previous visit (from the past 240  hour(s)).    Studies:  Ct Angio Chest Pe W And/or Wo Contrast  Result Date: 03/30/2017 CLINICAL DATA:  Chronic lower leg soft-tissue necrosis. Shortness of breath. EXAM: CT ANGIOGRAPHY CHEST WITH CONTRAST TECHNIQUE: Multidetector CT imaging of the chest was performed using the standard protocol during bolus administration of intravenous contrast. Multiplanar CT image reconstructions and MIPs were obtained to evaluate the vascular anatomy. CONTRAST:  80 mL of Isovue 370 COMPARISON:  Chest x-ray from earlier today. Chest CT February 07, 2017. FINDINGS: Cardiovascular: The thoracic aorta is normal in caliber with no dissection. The heart size is normal. The coronary arteries are unremarkable. Evaluation of the pulmonary arteries is severely limited due to respiratory motion, artifact, and timing of the contrast bolus. However, I am suspicious there are emboli such as on coronal image 68 in the left upper lobe. Numerous other regions of decreased attenuation are seen in the pulmonary arteries. I suspect much of this is artifact but findings are also concerning for additional emboli. Another possible embolus is seen on the right on coronal image 72. Mediastinum/Nodes: Mild nodularity in the thyroid. The esophagus is unremarkable. The increased soft tissue in stranding in the anterior and middle mediastinum is similar in the interval. There is a prominent right paratracheal node on series 4, image 27 measuring 12 mm. No hilar adenopathy. There is increasing fat stranding in the bilateral axilla, left greater than right with numerous shotty nodes which have increased. Subcutaneous edema seen in the chest, well seen on the right on image 69. A small pericardial effusion is identified. Lungs/Pleura: The central airways are normal. No pneumothorax. Bilateral pulmonary infiltrates are identified presenting primarily as ill defined alveolar and nodular opacities. A nodular component on series 11, image 38 measure up to  14 mm. Mild interlobular septal thickening is seen in some regions. Left greater than right pleural effusions are seen. Opacities associated with the effusions may simply represent compressive atelectasis. Upper Abdomen: There is increased attenuation in the fat in the gastrohepatic ligament and periceliac region. Mild caliectasis in the upper pole the left kidney. No other abnormalities in the upper abdomen. Musculoskeletal: The bones are stable. Lucency in the left T9 vertebral body is stable. Review of the MIP images confirms the above findings. IMPRESSION: 1. Evaluation for pulmonary emboli is severely limited as above. However, findings are suspicious but not definitive for emboli as above. 2. The ill defined alveolar and nodular opacities with bilateral effusions and some interlobular septal thickening is nonspecific. Kaposi's sarcoma involving the lungs could have this appearance. However, infectious processes are also possible. 3. Extensive soft tissue stranding throughout the anterior and middle mediastinum is stable. There  is a similar process in the bilateral axilla and upper abdomen which is also new. The underlying etiology of these findings is unclear. Volume overload is a possibility. A process secondary to the patient's Kaposi sarcoma is not excluded but the findings are unusual. 4. Caliectasis in the upper pole left kidney is incompletely evaluated. Findings discussed with Dr. Roderic Palau Electronically Signed   By: Dorise Bullion III M.D   On: 03/30/2017 22:36   Dg Chest Port 1 View  Result Date: 03/30/2017 CLINICAL DATA:  Marked weakness. Shortness of breath. Currently being treated for Kaposi sarcoma. EXAM: PORTABLE CHEST 1 VIEW COMPARISON:  03/21/2017. FINDINGS: Stable enlarged cardiac silhouette. Mild increase in prominence of the interstitial markings in the lower lung zones with a stable small right pleural effusion and interval minimal left pleural effusion. Interval minimal nodularity at  the right lung base laterally. Unremarkable bones. IMPRESSION: Mild worsening of changes of congestive heart failure. There is interval minimal nodularity at the right lung base, possibly representing small foci of infection. Electronically Signed   By: Claudie Revering M.D.   On: 03/30/2017 20:53    Assessment: 46 y.o. male   1.  Disseminated Kaposi's sarcoma 2.  Worsening chest pain and dyspnea, pulmonary metastasis versus atypical infection  3.  Possible PE 4. Worsening anemia, secondary to chemo and malignancy 5. Mild thrombocytopenia secondary to chemo and PE  5. Insomnia   Recommendations: -I have reviewed his CT chest, which showed probable PE.  Due to severely limited quality of image, the diagnosis was not definitive.  I recommend Doppler of bilateral lower extremity to rule out DVT.  Given his underlying diffuse malignancy, he is at high risk for thrombosis.  I agree with empiric anticoagulation. -His CT scan also showed ill-defined nodular opacities in his lungs, especially on the left side.  Possible Kaposi's sarcoma involving the lungs, versus atypical infections.  He is clinically afebrile, no leukocytosis, I do not feel strongly he needs antibiotics.  I recommend pulmonary consultation for further evaluation, and possible bronchoscopy -his recent ECHO showed normal EF, CT scan showed no significant pulmonary edema, his dyspnea is unlikely cardiogenic. -continue pain management, I recommend MS contin, and continue iv dilaudid for break through. If his pain improved, may change dilaudid to oral morphine  -Dr. Alen Blew will see patient on Monday. I am available for questions over the weekend.  -Appreciate the excellent care from the hospitalist team     Truitt Merle, MD 03/31/2017  5:24 PM

## 2017-03-31 NOTE — ED Notes (Signed)
Bed changed to 1342 approved by floor

## 2017-03-31 NOTE — Progress Notes (Signed)
ANTICOAGULATION CONSULT NOTE - Follow-Up Consult  Pharmacy Consult for IV heparin Indication: pulmonary embolus  Assessment: See assessment by pharmacist earlier today for further detail.    Follow up heparin level this evening = 0.23 units/ml, increased from this morning's level but remains subtherapeutic despite rate increase to 1800 units/hr.  Goal of Therapy:  Heparin level 0.3-0.7 units/ml Monitor platelets by anticoagulation protocol: Yes   Plan:  Increase heparin infusion to 2000 units/hr. Check heparin level in 6 hours.  Hershal Coria, PharmD, BCPS Pager: [phone] 03/31/2017 6:27 PM

## 2017-03-31 NOTE — Consult Note (Signed)
PULMONARY / CRITICAL CARE MEDICINE   Name: Victor Hayden Paparella MRN: [id number] DOB: 02-Nov-1970    ADMISSION DATE:  03/30/2017 CONSULTATION DATE: 03/31/2017   CHIEF COMPLAINT: Dyspnea  HISTORY OF PRESENT ILLNESS:   This is a 46 year old with Kaposi's sarcoma who is being treated with Doxil.  He presented with dyspnea.  Not had any consistent fevers or chills he has not had any productive cough.  He does have some pleuritic chest pain.Marland Kitchen  He is not having URI symptoms.  A CTA of the chest has suggested multiple pulmonary emboli but it is a low quality study.  A recent echocardiogram has shown normal LV systolic function despite the extended use of Doxil.  He has had chronic and impressive lower extremity edema.  In fact his most prominent complaint at the present time is chronic pain in his lower extremities which is preventing him from sleeping.  PAST MEDICAL HISTORY :  He  has a past medical history of kaposi sarcoma (dx'd 08/2016).  PAST SURGICAL HISTORY: He  has no past surgical history on file.  Allergies  Allergen Reactions  . Bee Venom Anaphylaxis    No current facility-administered medications on file prior to encounter.    Current Outpatient Medications on File Prior to Encounter  Medication Sig  . albuterol (PROVENTIL HFA;VENTOLIN HFA) 108 (90 Base) MCG/ACT inhaler Inhale 2 puffs every 6 (six) hours as needed into the lungs for wheezing or shortness of breath.  . beclomethasone (QVAR) 40 MCG/ACT inhaler Inhale 2 puffs into the lungs 2 (two) times daily.  . benzonatate (TESSALON) 100 MG capsule Take 1 capsule (100 mg total) by mouth 3 (three) times daily as needed for cough.  . esomeprazole (NEXIUM) 20 MG capsule Take 20 mg daily at 12 noon by mouth.  . furosemide (LASIX) 20 MG tablet Take 1 tablet (20 mg total) daily by mouth.  . magic mouthwash w/lidocaine SOLN Take 5 mLs by mouth 4 (four) times daily as needed for mouth pain.  Marland Kitchen nystatin cream (MYCOSTATIN) Apply 1  application topically 2 (two) times daily.  . prochlorperazine (COMPAZINE) 10 MG tablet Take 1 tablet (10 mg total) by mouth every 6 (six) hours as needed for nausea or vomiting.  . sucralfate (CARAFATE) 1 GM/10ML suspension Take 10 mLs (1 g total) by mouth 4 (four) times daily -  with meals and at bedtime.    FAMILY HISTORY:  His indicated that his mother is alive. He indicated that his father is alive.   SOCIAL HISTORY: He  reports that he quit smoking about 2 months ago. His smoking use included cigarettes. He started smoking about 30 years ago. He has a 29.00 pack-year smoking history. he has never used smokeless tobacco. He reports that he does not drink alcohol or use drugs.  REVIEW OF SYSTEMS:   Noncontributory  SUBJECTIVE:  As Above  VITAL SIGNS: BP 115/69 (BP Location: Left Arm)   Pulse (!) 109   Temp 97.9 F (36.6 C) (Oral)   Resp 20   Ht 5\' 10"  (1.778 m)   Wt 205 lb (93 kg)   SpO2 92%   BMI 29.41 kg/m   HEMODYNAMICS:    VENTILATOR SETTINGS:    INTAKE / OUTPUT: I/O last 3 completed shifts: In: 3.9 [I.V.:3.9] Out: -   PHYSICAL EXAMINATION: General: He is breathing room air and able speak in complete sentences without distress during my examination. Neuro: He is entirely alert and appropriate Cardiovascular: There is no JVD.  S1 and S2 are  regular without murmur rub or gallop.  He has very impressive brawny to hard edema almost to the inguinal ligaments bilaterally. Lungs: Respirations are unlabored, there is symmetric air movement, no wheezes, a few scattered rhonchi, no rubs. Abdomen: The abdomen is flat and soft without any reproducible tenderness.  I do not appreciate any organomegaly masses guarding or rebound. LABS:  BMET Recent Labs  Lab 03/30/17 2014  NA 131*  K 3.5  CL 100*  CO2 24  BUN 17  CREATININE 1.02  GLUCOSE 138*    Electrolytes Recent Labs  Lab 03/30/17 2014  CALCIUM 7.6*    CBC Recent Labs  Lab 03/30/17 2014  03/31/17 0824  WBC 4.9 4.7  HGB 9.2* 8.8*  HCT 28.8* 26.9*  PLT 107* 91*    Coag's Recent Labs  Lab 03/30/17 2014  APTT 28  INR 1.08    Sepsis Markers No results for input(s): LATICACIDVEN, PROCALCITON, O2SATVEN in the last 168 hours.  ABG No results for input(s): PHART, PCO2ART, PO2ART in the last 168 hours.  Liver Enzymes Recent Labs  Lab 03/30/17 2014  AST 34  ALT 12*  ALKPHOS 56  BILITOT 0.9  ALBUMIN 2.1*    Cardiac Enzymes Recent Labs  Lab 03/31/17 0049  TROPONINI <0.03    Glucose No results for input(s): GLUCAP in the last 168 hours.  Imaging Ct Angio Chest Pe W And/or Wo Contrast  Result Date: 03/30/2017 CLINICAL DATA:  Chronic lower leg soft-tissue necrosis. Shortness of breath. EXAM: CT ANGIOGRAPHY CHEST WITH CONTRAST TECHNIQUE: Multidetector CT imaging of the chest was performed using the standard protocol during bolus administration of intravenous contrast. Multiplanar CT image reconstructions and MIPs were obtained to evaluate the vascular anatomy. CONTRAST:  80 mL of Isovue 370 COMPARISON:  Chest x-ray from earlier today. Chest CT February 07, 2017. FINDINGS: Cardiovascular: The thoracic aorta is normal in caliber with no dissection. The heart size is normal. The coronary arteries are unremarkable. Evaluation of the pulmonary arteries is severely limited due to respiratory motion, artifact, and timing of the contrast bolus. However, I am suspicious there are emboli such as on coronal image 68 in the left upper lobe. Numerous other regions of decreased attenuation are seen in the pulmonary arteries. I suspect much of this is artifact but findings are also concerning for additional emboli. Another possible embolus is seen on the right on coronal image 72. Mediastinum/Nodes: Mild nodularity in the thyroid. The esophagus is unremarkable. The increased soft tissue in stranding in the anterior and middle mediastinum is similar in the interval. There is a prominent  right paratracheal node on series 4, image 27 measuring 12 mm. No hilar adenopathy. There is increasing fat stranding in the bilateral axilla, left greater than right with numerous shotty nodes which have increased. Subcutaneous edema seen in the chest, well seen on the right on image 69. A small pericardial effusion is identified. Lungs/Pleura: The central airways are normal. No pneumothorax. Bilateral pulmonary infiltrates are identified presenting primarily as ill defined alveolar and nodular opacities. A nodular component on series 11, image 38 measure up to 14 mm. Mild interlobular septal thickening is seen in some regions. Left greater than right pleural effusions are seen. Opacities associated with the effusions may simply represent compressive atelectasis. Upper Abdomen: There is increased attenuation in the fat in the gastrohepatic ligament and periceliac region. Mild caliectasis in the upper pole the left kidney. No other abnormalities in the upper abdomen. Musculoskeletal: The bones are stable. Lucency in the left  T9 vertebral body is stable. Review of the MIP images confirms the above findings. IMPRESSION: 1. Evaluation for pulmonary emboli is severely limited as above. However, findings are suspicious but not definitive for emboli as above. 2. The ill defined alveolar and nodular opacities with bilateral effusions and some interlobular septal thickening is nonspecific. Kaposi's sarcoma involving the lungs could have this appearance. However, infectious processes are also possible. 3. Extensive soft tissue stranding throughout the anterior and middle mediastinum is stable. There is a similar process in the bilateral axilla and upper abdomen which is also new. The underlying etiology of these findings is unclear. Volume overload is a possibility. A process secondary to the patient's Kaposi sarcoma is not excluded but the findings are unusual. 4. Caliectasis in the upper pole left kidney is incompletely  evaluated. Findings discussed with Dr. Roderic Palau Electronically Signed   By: Dorise Bullion III M.D   On: 03/30/2017 22:36   Dg Chest Port 1 View  Result Date: 03/30/2017 CLINICAL DATA:  Marked weakness. Shortness of breath. Currently being treated for Kaposi sarcoma. EXAM: PORTABLE CHEST 1 VIEW COMPARISON:  03/21/2017. FINDINGS: Stable enlarged cardiac silhouette. Mild increase in prominence of the interstitial markings in the lower lung zones with a stable small right pleural effusion and interval minimal left pleural effusion. Interval minimal nodularity at the right lung base laterally. Unremarkable bones. IMPRESSION: Mild worsening of changes of congestive heart failure. There is interval minimal nodularity at the right lung base, possibly representing small foci of infection. Electronically Signed   By: Claudie Revering M.D.   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  He was admitted for dyspnea.  CTA of the chest is suggestive of multiple pulmonary emboli however the quality of the study was poor.  Looking at the parenchyma there may be some subtle infiltrate but he is not having significant cough or fever at this time and I have not covered empirically for infection.  To his chronic treatment with Doxil a recent echocardiogram on 11/21 showed an ejection fraction of 55-60%.  The CT to my eye does not show evidence of congestive heart failure.  There are small bilateral pleural effusions left greater than right but neither is of a magnitude that it ought to be contributing to dyspnea.  I am going to plan to continue him on anticoagulation, obtain Dopplers of lower extremities to confirm the presence of thromboembolic disease, hold off on empirically treating for an infectious component.  Should he develop fever and cough I would place him on empiric therapy for  community-acquired pneumonia and only proceed to bronchoscopy should he fail therapy.  ASSESSMENT / PLAN:  PULMONARY A: As discussed above, he has dyspnea which is likely on the basis of multiple pulmonary emboli not Doxil induced cardiac dysfunction or secondary infection in a patient who has been chronically immunosuppressed.     NEUROLOGIC A: He has significant chronic pain in his lower extremities and by report of family has not slept for many many nights.  Added a modest dose of sustained-release morphine hoping to improve his pain control.   Lars Masson, MD Pulmonary and [place] Pager: [phone]  03/31/2017, 12:49 PM

## 2017-03-31 NOTE — Progress Notes (Signed)
ANTICOAGULATION CONSULT NOTE - Initial Consult  Pharmacy Consult for IV heparin Indication: pulmonary embolus  Allergies  Allergen Reactions  . Bee Venom Anaphylaxis    Patient Measurements: Height: 5\' 10"  (177.8 cm) Weight: 205 lb (93 kg) IBW/kg (Calculated) : 73 Heparin Dosing Weight: 79 kg  Vital Signs: Temp: 97.9 F (36.6 C) (11/30 1925) Temp Source: Oral (11/30 1925) BP: 113/67 (12/01 0000) Pulse Rate: 109 (12/01 0000)  Labs: Recent Labs    03/30/17 2014  HGB 9.2*  HCT 28.8*  PLT 107*  CREATININE 1.02    Estimated Creatinine Clearance: 101.5 mL/min (by C-G formula based on SCr of 1.02 mg/dL).   Medical History: Past Medical History:  Diagnosis Date  . kaposi sarcoma dx'd 08/2016    Medications:  Scheduled:  . heparin  4,000 Units Intravenous Once   Infusions:  . heparin      Assessment: 67 yoM c/o swelling, pain in left leg and SOB. Found to have PE. IV heparin per Rx. Baseline coags:  H/H=9.2/28.8 and plts=107 Goal of Therapy:  Heparin level 0.3-0.7 units/ml Monitor platelets by anticoagulation protocol: Yes   Plan:  Baseline coags STAT Heparin 4000 unit bolus IV x1 Start drip at 1500 units/hr Daily CBC/HL Check 1st HL in 6 hours  Dorrene German 03/31/2017,12:21 AM

## 2017-03-31 NOTE — Plan of Care (Signed)
  Pain Managment: General experience of comfort will improve 03/31/2017 2058 - Progressing by Mickie Kay, RN

## 2017-03-31 NOTE — Progress Notes (Signed)
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 Kaposi's sarcoma involving the lungs could have this appearance. However, infectious processes are also possible. Pulmonary consulted for possible bronchoscopy  Bilateral lower extremity edema Reports chronic Home dose Lasix has not been helping, venous Doppler pending Change to IV Lasix, monitor blood pressure and renal function   Extensive soft tissue stranding throughout the anterior and middle mediastinum is stable. There is a similar process in the bilateral axilla and upper abdomen which is also new. The underlying etiology of these findings is unclear. Volume overload is a possibility. A process secondary to the patient's Kaposi sarcoma is not excluded but the findings are unusual. 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  -He presented with disseminated disease with multiple nodules and plaques in his upper and lower extremities, trunk as well as lymphatic involvement. He has also possible disease in the lung in the gut. He is HIV negative. -He started on chemotherapy with  doxorubicin on Sep 28, 2016 -Chemotherapy was held on his last follow-up visit on March 23, 2017, due to his worsening anemia. status post blood transfusion on November 28  Cancer pain; on MS Contin and as needed IV Dilaudid for now, patient reports good pain relief able to finally sleep.  Positive H. Pylori, test done on November 1.  Report finished the treatment.  FTT: Generalized weakness, will benefit from PT eval once pulmonary status improves.  Code Status: DNR  Family Communication: patient   Disposition Plan:    Consultants:  Oncology  pulmonology  Procedures:  none  Antibiotics:  none   Objective: BP 111/67 (BP Location: Right Arm)   Pulse (!) 105   Temp 98 F (36.7 C) (Oral)   Resp 20   Ht 5\' 10"  (1.778 m)   Wt 93 kg (205 lb)   SpO2 95%   BMI 29.41 kg/m   Intake/Output Summary (Last 24 hours) at 03/31/2017 1453 Last data filed at 03/31/2017 0815 Gross per 24 hour  Intake 3.9 ml  Output 250 ml  Net -246.1 ml   Filed Weights   03/30/17 1925  Weight: 93 kg (205 lb)    Exam: Patient is examined daily including today on 03/31/2017, exams remain the same as of yesterday except  that has changed    General: Drowsy ,NAD  Cardiovascular: Mild sinus tachycardia  Respiratory: CTABL  Abdomen: Soft/ND/NT, positive BS  Musculoskeletal: bilateral lower extremity pitting  Edema, skin is hard and dark in color  Neuro: Drowsy  Data Reviewed: Basic Metabolic Panel: Recent Labs  Lab 03/30/17 2014  NA 131*  K 3.5  CL 100*  CO2 24   GLUCOSE 138*  BUN 17  CREATININE 1.02  CALCIUM 7.6*   Liver Function Tests: Recent Labs  Lab 03/30/17 2014  AST 34  ALT 12*  ALKPHOS 56  BILITOT 0.9  PROT 5.5*  ALBUMIN 2.1*   No results for input(s): LIPASE, AMYLASE in the last 168 hours. No results for input(s): AMMONIA in the last 168 hours. CBC: Recent Labs  Lab 03/30/17 2014 03/31/17 0824  WBC 4.9 4.7  NEUTROABS 3.3  --   HGB 9.2* 8.8*  HCT 28.8* 26.9*  MCV 84.0 84.1  PLT 107* 91*   Cardiac Enzymes:   Recent Labs  Lab 03/31/17 0049  TROPONINI <0.03   BNP (last 3 results) Recent Labs    03/30/17 2014  BNP 30.6    ProBNP (last 3 results) Recent Labs    03/01/17 1036  PROBNP 10.0    CBG: No results for input(s): GLUCAP in the last 168 hours.  No results found for this or any previous visit (from the past 240 hour(s)).   Studies: Ct Angio Chest Pe W And/or Wo Contrast  Result Date: 03/30/2017 CLINICAL DATA:  Chronic lower leg soft-tissue necrosis. Shortness of breath. EXAM: CT ANGIOGRAPHY CHEST WITH CONTRAST TECHNIQUE: Multidetector CT imaging of the chest was performed using the standard protocol during bolus administration of intravenous contrast. Multiplanar CT image reconstructions and MIPs were obtained to evaluate the vascular anatomy. CONTRAST:  80 mL of Isovue 370 COMPARISON:  Chest x-ray from earlier today. Chest CT February 07, 2017. FINDINGS: Cardiovascular: The thoracic aorta is normal in caliber with no dissection. The heart size is normal. The coronary arteries are unremarkable. Evaluation of the pulmonary arteries is severely limited due to respiratory motion, artifact, and timing of the contrast bolus. However, I am suspicious there are emboli such as on coronal image 68 in the left upper lobe. Numerous other regions of decreased attenuation are seen in the pulmonary arteries. I suspect much of this is artifact but findings are also concerning for additional emboli. Another possible embolus  is seen on the right on coronal image 72. Mediastinum/Nodes: Mild nodularity in the thyroid. The esophagus is unremarkable. The increased soft tissue in stranding in the anterior and middle mediastinum is similar in the interval. There is a prominent right paratracheal node on series 4, image 27 measuring 12 mm. No hilar adenopathy. There is increasing fat stranding in the bilateral axilla, left greater than right with numerous shotty nodes which have increased. Subcutaneous edema seen in the chest, well seen on the right on image 69. A small pericardial effusion is identified. Lungs/Pleura: The central airways are normal. No pneumothorax. Bilateral pulmonary infiltrates are identified presenting primarily as ill defined alveolar and nodular opacities. A nodular component on series 11, image 38 measure up to 14 mm. Mild interlobular septal thickening is seen in some regions. Left greater than right pleural effusions are seen. Opacities associated with the effusions may simply represent compressive atelectasis. Upper Abdomen: There is increased attenuation in the fat in the gastrohepatic ligament and periceliac region. Mild caliectasis in the upper pole the left kidney. No other abnormalities in the  upper abdomen. Musculoskeletal: The bones are stable. Lucency in the left T9 vertebral body is stable. Review of the MIP images confirms the above findings. IMPRESSION: 1. Evaluation for pulmonary emboli is severely limited as above. However, findings are suspicious but not definitive for emboli as above. 2. The ill defined alveolar and nodular opacities with bilateral effusions and some interlobular septal thickening is nonspecific. Kaposi's sarcoma involving the lungs could have this appearance. However, infectious processes are also possible. 3. Extensive soft tissue stranding throughout the anterior and middle mediastinum is stable. There is a similar process in the bilateral axilla and upper abdomen which is also  new. The underlying etiology of these findings is unclear. Volume overload is a possibility. A process secondary to the patient's Kaposi sarcoma is not excluded but the findings are unusual. 4. Caliectasis in the upper pole left kidney is incompletely evaluated. Findings discussed with Dr. Roderic Palau Electronically Signed   By: Dorise Bullion III M.D   On: 03/30/2017 22:36   Dg Chest Port 1 View  Result Date: 03/30/2017 CLINICAL DATA:  Marked weakness. Shortness of breath. Currently being treated for Kaposi sarcoma. EXAM: PORTABLE CHEST 1 VIEW COMPARISON:  03/21/2017. FINDINGS: Stable enlarged cardiac silhouette. Mild increase in prominence of the interstitial markings in the lower lung zones with a stable small right pleural effusion and interval minimal left pleural effusion. Interval minimal nodularity at the right lung base laterally. Unremarkable bones. IMPRESSION: Mild worsening of changes of congestive heart failure. There is interval minimal nodularity at the right lung base, possibly representing small foci of infection. Electronically Signed   By: Claudie Revering M.D.   On: 03/30/2017 20:53    Scheduled Meds: . budesonide (PULMICORT) nebulizer solution  0.25 mg Nebulization BID  . docusate sodium  100 mg Oral BID  . furosemide  20 mg Oral Daily  . morphine  15 mg Oral Q12H  . pantoprazole  40 mg Oral Daily  . sucralfate  1 g Oral TID WC & HS    Continuous Infusions: . heparin 1,800 Units/hr (03/31/17 1322)     Time spent: 35 mins, case discussed with oncology and pulmonology I have personally reviewed and interpreted on  03/31/2017 daily labs,  imagings as discussed above under date review session and assessment and plans.  I reviewed all nursing notes, pharmacy notes, consultant notes,  vitals, pertinent old records  I have discussed plan of care as described above with RN , patient on 03/31/2017   Florencia Reasons MD, PhD  [facility] Pager [phone]. If 7PM-7AM, please contact  night-coverage at [email or web address], password [facility] 03/31/2017, 2:53 PM  LOS: 1 day

## 2017-03-31 NOTE — Progress Notes (Addendum)
ANTICOAGULATION CONSULT NOTE - Follow-Up Consult  Pharmacy Consult for IV heparin Indication: pulmonary embolus  Allergies  Allergen Reactions  . Bee Venom Anaphylaxis    Patient Measurements: Height: 5\' 10"  (177.8 cm) Weight: 205 lb (93 kg) IBW/kg (Calculated) : 73 Heparin Dosing Weight: 91.8 kg  Vital Signs: Temp Source: Oral (12/01 0814) BP: 115/69 (12/01 0814) Pulse Rate: 109 (12/01 0814)  Labs: Recent Labs    03/30/17 2014 03/31/17 0049 03/31/17 0824  HGB 9.2*  --  8.8*  HCT 28.8*  --  26.9*  PLT 107*  --  91*  APTT 28  --   --   LABPROT 13.9  --   --   INR 1.08  --   --   HEPARINUNFRC  --   --  0.15*  CREATININE 1.02  --   --   TROPONINI  --  <0.03  --     Estimated Creatinine Clearance: 101.5 mL/min (by C-G formula based on SCr of 1.02 mg/dL).   Medical History: Past Medical History:  Diagnosis Date  . kaposi sarcoma dx'd 08/2016    Assessment: 42 yoM c/o swelling, pain in left leg and SOB. CTa of chest: findings are suspicious but not definitive for PE. Pharmacy consulted to assist with dosing of heparin for possible PE.  Baseline coags: PT/INR: 13.9/1.08, aPTT 28 seconds. CBC: Hgb low at 9.2, Pltc low at 107K. Note that patient received 2 units PRBCs on 11/28 as outpatient.   Today, 03/31/17:   First heparin level subtherapeutic at 0.15 units/mL on heparin infusion at 1500 units/hr  CBC: Hgb decreased to 8.8, Pltc decreased to 91K  No bleeding or complications of therapy reported per nursing   Goal of Therapy:  Heparin level 0.3-0.7 units/ml Monitor platelets by anticoagulation protocol: Yes   Plan:   Increase heparin infusion to 1800 units/hr.  Check heparin level 6 hours after rate change.   Daily CBC and heparin level while on heparin infusion. Monitor Hgb and Pltc closely.   Monitor closely for s/sx of bleeding.   F/u oncology recommendations.    Lindell Spar, PharmD, BCPS Pager: [phone] 03/31/2017 10:06 AM

## 2017-04-01 ENCOUNTER — Inpatient Hospital Stay (HOSPITAL_COMMUNITY): Payer: Medicaid Other

## 2017-04-01 DIAGNOSIS — M7989 Other specified soft tissue disorders: Secondary | ICD-10-CM

## 2017-04-01 DIAGNOSIS — R0781 Pleurodynia: Secondary | ICD-10-CM

## 2017-04-01 DIAGNOSIS — R609 Edema, unspecified: Secondary | ICD-10-CM

## 2017-04-01 DIAGNOSIS — I2699 Other pulmonary embolism without acute cor pulmonale: Secondary | ICD-10-CM

## 2017-04-01 LAB — IRON AND TIBC
Iron: 20 ug/dL — ABNORMAL LOW (ref 45–182)
Saturation Ratios: 11 % — ABNORMAL LOW (ref 17.9–39.5)
TIBC: 181 ug/dL — ABNORMAL LOW (ref 250–450)
UIBC: 161 ug/dL

## 2017-04-01 LAB — URINE CULTURE
Culture: NO GROWTH
Special Requests: NORMAL

## 2017-04-01 LAB — ECHOCARDIOGRAM COMPLETE
AOASC: 33 cm
CHL CUP MV DEC (S): 211
CHL CUP REG VEL DIAS: 92.3 cm/s
CHL CUP RV SYS PRESS: 29 mmHg
EWDT: 211 ms
FS: 33 % (ref 28–44)
HEIGHTINCHES: 70 in
IV/PV OW: 1.14
LA ID, A-P, ES: 37 mm
LA diam end sys: 37 mm
LA vol A4C: 39.4 ml
LADIAMINDEX: 1.71 cm/m2
LAVOL: 46 mL
LAVOLIN: 21.2 mL/m2
LV PW d: 8.05 mm — AB (ref 0.6–1.1)
LVOT area: 3.46 cm2
LVOTD: 21 mm
MV Peak grad: 3 mmHg
MV pk E vel: 81 m/s
MVPKAVEL: 86.8 m/s
RV TAPSE: 17.5 mm
Reg peak vel: 254 cm/s
TR max vel: 254 cm/s
Weight: 3280 oz

## 2017-04-01 LAB — MAGNESIUM: MAGNESIUM: 1.8 mg/dL (ref 1.7–2.4)

## 2017-04-01 LAB — BASIC METABOLIC PANEL
ANION GAP: 8 (ref 5–15)
BUN: 22 mg/dL — ABNORMAL HIGH (ref 6–20)
CALCIUM: 7.5 mg/dL — AB (ref 8.9–10.3)
CO2: 23 mmol/L (ref 22–32)
Chloride: 98 mmol/L — ABNORMAL LOW (ref 101–111)
Creatinine, Ser: 1.09 mg/dL (ref 0.61–1.24)
Glucose, Bld: 131 mg/dL — ABNORMAL HIGH (ref 65–99)
POTASSIUM: 3.9 mmol/L (ref 3.5–5.1)
Sodium: 129 mmol/L — ABNORMAL LOW (ref 135–145)

## 2017-04-01 LAB — RETICULOCYTES
RBC.: 3.09 MIL/uL — AB (ref 4.22–5.81)
RETIC COUNT ABSOLUTE: 111.2 10*3/uL (ref 19.0–186.0)
Retic Ct Pct: 3.6 % — ABNORMAL HIGH (ref 0.4–3.1)

## 2017-04-01 LAB — CBC
HCT: 25.8 % — ABNORMAL LOW (ref 39.0–52.0)
Hemoglobin: 8.5 g/dL — ABNORMAL LOW (ref 13.0–17.0)
MCH: 27.5 pg (ref 26.0–34.0)
MCHC: 32.9 g/dL (ref 30.0–36.0)
MCV: 83.5 fL (ref 78.0–100.0)
PLATELETS: 94 10*3/uL — AB (ref 150–400)
RBC: 3.09 MIL/uL — AB (ref 4.22–5.81)
RDW: 18.1 % — AB (ref 11.5–15.5)
WBC: 5.6 10*3/uL (ref 4.0–10.5)

## 2017-04-01 LAB — FOLATE: Folate: 10.5 ng/mL (ref >5.9)

## 2017-04-01 LAB — TSH: TSH: 4.026 u[IU]/mL (ref 0.350–4.500)

## 2017-04-01 LAB — HEPARIN LEVEL (UNFRACTIONATED)
HEPARIN UNFRACTIONATED: 0.24 [IU]/mL — AB (ref 0.30–0.70)
HEPARIN UNFRACTIONATED: 0.44 [IU]/mL (ref 0.30–0.70)
HEPARIN UNFRACTIONATED: 0.54 [IU]/mL (ref 0.30–0.70)

## 2017-04-01 LAB — VITAMIN B12: VITAMIN B 12: 257 pg/mL (ref 180–914)

## 2017-04-01 MED ORDER — HEPARIN (PORCINE) IN NACL 100-0.45 UNIT/ML-% IJ SOLN
2300.0000 [IU]/h | INTRAMUSCULAR | Status: DC
  Administered 2017-04-01 – 2017-04-02 (×3): 2300 [IU]/h via INTRAVENOUS
  Filled 2017-04-01 (×4): qty 250

## 2017-04-01 MED ORDER — HEPARIN BOLUS VIA INFUSION
2000.0000 [IU] | Freq: Once | INTRAVENOUS | Status: AC
  Administered 2017-04-01: 2000 [IU] via INTRAVENOUS
  Filled 2017-04-01: qty 2000

## 2017-04-01 NOTE — Progress Notes (Signed)
ANTICOAGULATION CONSULT NOTE - Follow-Up Consult  Pharmacy Consult for IV heparin Indication: pulmonary embolus  Allergies  Allergen Reactions  . Bee Venom Anaphylaxis    Patient Measurements: Height: 5\' 10"  (177.8 cm) Weight: 205 lb (93 kg) IBW/kg (Calculated) : 73 Heparin Dosing Weight: 91.8 kg  Vital Signs: Temp: 98.7 F (37.1 C) (12/02 0522) Temp Source: Axillary (12/02 0522) BP: 126/67 (12/02 0522) Pulse Rate: 107 (12/02 0522)  Labs: Recent Labs    03/30/17 2014 03/31/17 0049  03/31/17 0824 03/31/17 1627 04/01/17 0153 04/01/17 0944  HGB 9.2*  --   --  8.8*  --  8.5*  --   HCT 28.8*  --   --  26.9*  --  25.8*  --   PLT 107*  --   --  91*  --  94*  --   APTT 28  --   --   --   --   --   --   LABPROT 13.9  --   --   --   --   --   --   INR 1.08  --   --   --   --   --   --   HEPARINUNFRC  --   --    < > 0.15* 0.23* 0.24* 0.44  CREATININE 1.02  --   --   --   --  1.09  --   TROPONINI  --  <0.03  --   --   --   --   --    < > = values in this interval not displayed.    Estimated Creatinine Clearance: 95 mL/min (by C-G formula based on SCr of 1.09 mg/dL).   Medical History: Past Medical History:  Diagnosis Date  . kaposi sarcoma dx'd 08/2016    Assessment: 56 yoM c/o swelling, pain in left leg and SOB. CTa of chest: findings are suspicious but not definitive for PE. Pharmacy consulted to assist with dosing of heparin for possible PE.  Baseline coags: PT/INR: 13.9/1.08, aPTT 28 seconds. CBC: Hgb low at 9.2, Pltc low at 107K. Note that patient received 2 units PRBCs on 11/28 as outpatient.   Bilateral lower extremity venous duplex negative for obvious DVT per vascular lab notes.   Today, 04/01/17:   0944 heparin level = 0.44 units/mL, now therapeutic on heparin infusion at 2300 units/hr  CBC: Hgb decreased to 8.5, Pltc slightly improved to 94K  No bleeding or complications of therapy reported per nursing   Goal of Therapy:  Heparin level 0.3-0.7  units/ml Monitor platelets by anticoagulation protocol: Yes   Plan:   Continue heparin infusion at 2300 units/hr.  Re-check heparin level at 1600 to ensure remains within therapeutic range.    Daily CBC and heparin level while on heparin infusion. Monitor Hgb and Pltc closely.   Monitor closely for s/sx of bleeding.    Lindell Spar, PharmD, BCPS Pager: [phone] 04/01/2017 11:40 AM

## 2017-04-01 NOTE — Progress Notes (Signed)
PULMONARY / CRITICAL CARE MEDICINE   Name: Victor Hayden MRN: [id number] DOB: 1970/11/07    ADMISSION DATE:  03/30/2017 CONSULTATION DATE: 03/31/2017  REFERRING PROVIDER: Dr. 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  CT angio chest concerning for pulmonary embolism.  SUBJECTIVE:  Still has chest discomfort and leg pains.  VITAL SIGNS: BP 126/67 (BP Location: Left Arm)   Pulse (!) 107   Temp 98.7 F (37.1 C) (Axillary)   Resp 16   Ht 5\' 10"  (1.778 m)   Wt 205 lb (93 kg)   SpO2 100%   BMI 29.41 kg/m   INTAKE / OUTPUT: I/O last 3 completed shifts: In: 244.8 [I.V.:244.8] Out: 750 [Urine:750]  PHYSICAL EXAMINATION:  General - alert Eyes - pupils reactive ENT - no sinus tenderness, no oral exudate, no LAN Cardiac - regular, no murmur Chest - no wheeze, rales Abd - soft, non tender Ext - 2+ non pitting edema Skin - extensive changes of lymphedema in lower extremities Neuro - normal strength   LABS:  BMET Recent Labs  Lab 03/30/17 2014 04/01/17 0153  NA 131* 129*  K 3.5 3.9  CL 100* 98*  CO2 24 23  BUN 17 22*  CREATININE 1.02 1.09  GLUCOSE 138* 131*    Electrolytes Recent Labs  Lab 03/30/17 2014 04/01/17 0153  CALCIUM 7.6* 7.5*  MG  --  1.8    CBC Recent Labs  Lab 03/30/17 2014 03/31/17 0824 04/01/17 0153  WBC 4.9 4.7 5.6  HGB 9.2* 8.8* 8.5*  HCT 28.8* 26.9* 25.8*  PLT 107* 91* 94*    Coag's Recent Labs  Lab 03/30/17 2014  APTT 28  INR 1.08    Sepsis Markers No results for input(s): LATICACIDVEN, PROCALCITON, O2SATVEN in the last 168 hours.  ABG No results for input(s): PHART, PCO2ART, PO2ART in the last 168 hours.  Liver Enzymes Recent Labs  Lab 03/30/17 2014  AST 34  ALT 12*  ALKPHOS 56  BILITOT 0.9  ALBUMIN 2.1*    Cardiac Enzymes Recent Labs  Lab  03/31/17 0049  TROPONINI <0.03    Glucose No results for input(s): GLUCAP in the last 168 hours.  Imaging No results found.   STUDIES:  CT angio chest 12/01 >> poor study, possible PE b/l, b/l nodular infiltrates, Lt > Rt pleural effusion Doppler legs b/l 12/02 >> no DVT Echo 12/02 >> EF 55 to 60%, normal RV function and PA pressure  DISCUSSION: 46 yo male with pleuritic type chest pain.  CT angio chest was suggestive of PE, but was poor study.  Echo unremarkable and doppler legs b/l negative.  Still has increased risk of thromboembolic disease in setting of Kaposi's sarcoma.  He also has Lt > Rt pleural effusion which has progressed since imaging studies from May 2018.  ASSESSMENT / PLAN:  Pleuritic chest pain with possible PE. - continue heparin gtt for now - will get V/Q scan to further assess  Pleural effusion. - f/u CXR and then decide if he might need thoracentesis  Kaposi's sarcoma. - f/u with oncology  Updated pt's mother at [place], MD [place] 04/01/2017, 1:49 PM Pager:  [phone] After 3pm call: [phone]

## 2017-04-01 NOTE — Progress Notes (Signed)
ANTICOAGULATION CONSULT NOTE - Follow-Up Consult  Pharmacy Consult for IV heparin Indication: pulmonary embolus  Allergies  Allergen Reactions  . Bee Venom Anaphylaxis    Patient Measurements: Height: 5\' 10"  (177.8 cm) Weight: 205 lb (93 kg) IBW/kg (Calculated) : 73 Heparin Dosing Weight: 91.8 kg  Vital Signs: Temp: 98.8 F (37.1 C) (12/02 1418) Temp Source: Oral (12/02 1418) BP: 114/63 (12/02 1418) Pulse Rate: 109 (12/02 1418)  Labs: Recent Labs    03/30/17 2014 03/31/17 0049 03/31/17 0824  04/01/17 0153 04/01/17 0944 04/01/17 1622  HGB 9.2*  --  8.8*  --  8.5*  --   --   HCT 28.8*  --  26.9*  --  25.8*  --   --   PLT 107*  --  91*  --  94*  --   --   APTT 28  --   --   --   --   --   --   LABPROT 13.9  --   --   --   --   --   --   INR 1.08  --   --   --   --   --   --   HEPARINUNFRC  --   --  0.15*   < > 0.24* 0.44 0.54  CREATININE 1.02  --   --   --  1.09  --   --   TROPONINI  --  <0.03  --   --   --   --   --    < > = values in this interval not displayed.    Estimated Creatinine Clearance: 95 mL/min (by C-G formula based on SCr of 1.09 mg/dL).   Medical History: Past Medical History:  Diagnosis Date  . kaposi sarcoma dx'd 08/2016    Assessment: 58 yoM c/o swelling, pain in left leg and SOB. CTa of chest: findings are suspicious but not definitive for PE. Pharmacy consulted to assist with dosing of heparin for possible PE.  Baseline coags: PT/INR: 13.9/1.08, aPTT 28 seconds. CBC: Hgb low at 9.2, Pltc low at 107K. Note that patient received 2 units PRBCs on 11/28 as outpatient.   Bilateral lower extremity venous duplex negative for obvious DVT per vascular lab notes.   Today, 04/01/17:   0944 heparin level = 0.44 units/mL, now therapeutic on heparin infusion at 2300 units/hr  CBC: Hgb decreased to 8.5, Pltc slightly improved to 94K  No bleeding or complications of therapy reported per nursing  04/01/2017 5:54 PM - heparin level remains therapeutic  at 0.54 units/ml with infusion at 2300 units/hr - No bleeding/complications reported.   Goal of Therapy:  Heparin level 0.3-0.7 units/ml Monitor platelets by anticoagulation protocol: Yes   Plan:   Continue heparin infusion at 2300 units/hr.  Daily CBC and heparin level while on heparin infusion.   Monitor closely for s/sx of bleeding.   Hershal Coria, PharmD, BCPS Pager: [phone] 04/01/2017 5:54 PM

## 2017-04-01 NOTE — Progress Notes (Signed)
ANTICOAGULATION CONSULT NOTE - Follow-Up Consult  Pharmacy Consult for IV heparin Indication: pulmonary embolus  Assessment: See assessment by pharmacist 12/1 for further detail.    0153 HL=0.24 still below goal, Confirmed with RN that IV site was good and that infusion has not been interupted.  No bleeding reported.  Goal of Therapy:  Heparin level 0.3-0.7 units/ml Monitor platelets by anticoagulation protocol: Yes   Plan:   Will rebolus with 2000 units IV heparin x1 now. Increase heparin infusion to 2300 units/hr. Check heparin level in 6 hours.    Dorrene German 04/01/2017 3:24 AM

## 2017-04-01 NOTE — Progress Notes (Signed)
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 Kaposi's sarcoma involving the lungs could have this appearance. However, infectious processes are also possible. Pulmonary consulted for possible bronchoscopy  Bilateral lower extremity edema Reports chronic Home dose Lasix has not been helping, venous Doppler pending Change to IV Lasix, monitor blood pressure and renal function   Extensive soft tissue stranding throughout the anterior and middle mediastinum is stable. There is a similar process in the bilateral axilla and upper abdomen which is also new. The underlying etiology of these findings is unclear. Volume overload is a possibility. A process secondary to the patient's Kaposi sarcoma is not excluded but the findings are unusual. 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  -He presented with disseminated disease with multiple nodules and plaques in his upper and lower extremities, trunk as well as lymphatic involvement. He has also possible disease in the lung in the gut. He is HIV negative. -He started on chemotherapy with  doxorubicin on Sep 28, 2016 -Chemotherapy was held on his last follow-up visit on March 23, 2017, due to his worsening anemia. status post blood transfusion on November 28  Cancer pain; on MS Contin and as needed IV Dilaudid for now, patient reports good pain relief able to finally sleep.  Positive H. Pylori, test done on November 1.  Report finished the treatment.  FTT: Generalized weakness, will benefit from PT eval once pulmonary status improves.  Code Status: DNR  Family Communication: patient   Disposition Plan: not ready for discharge   Consultants:  Oncology  pulmonology  Procedures:  none  Antibiotics:  none   Objective: BP 126/67 (BP Location: Left Arm)   Pulse (!) 107   Temp 98.7 F (37.1 C) (Axillary)   Resp 16   Ht 5\' 10"  (1.778 m)   Wt 93 kg (205 lb)   SpO2 100%   BMI 29.41 kg/m   Intake/Output Summary (Last 24 hours) at 04/01/2017 0916 Last data filed at 04/01/2017 0500 Gross per 24 hour  Intake 240.88 ml  Output 500 ml  Net -259.12 ml   Filed Weights   03/30/17 1925  Weight: 93 kg (205 lb)    Exam: Patient is examined daily including today on 04/01/2017, exams remain the same as of yesterday except that has changed    General: frail, alert ,NAD  Cardiovascular: Mild sinus tachycardia  Respiratory: CTABL  Abdomen: Soft/ND/NT, positive BS  Musculoskeletal: bilateral lower extremity pitting  Edema, skin is hard and dark in color  Neuro: alert  Data Reviewed: Basic Metabolic Panel: Recent Labs  Lab 03/30/17 2014 04/01/17 0153  NA 131* 129*  K 3.5 3.9  CL 100* 98*  CO2 24 23  GLUCOSE 138* 131*  BUN 17  22*  CREATININE 1.02 1.09  CALCIUM 7.6* 7.5*  MG  --  1.8   Liver Function Tests: Recent Labs  Lab 03/30/17 2014  AST 34  ALT 12*  ALKPHOS 56  BILITOT 0.9  PROT 5.5*  ALBUMIN 2.1*   No results for input(s): LIPASE, AMYLASE in the last 168 hours. No results for input(s): AMMONIA in the last 168 hours. CBC: Recent Labs  Lab 03/30/17 2014 03/31/17 0824 04/01/17 0153  WBC 4.9 4.7 5.6  NEUTROABS 3.3  --   --   HGB 9.2* 8.8* 8.5*  HCT 28.8* 26.9* 25.8*  MCV 84.0 84.1 83.5  PLT 107* 91* 94*   Cardiac Enzymes:   Recent Labs  Lab 03/31/17 0049  TROPONINI <0.03   BNP (last 3 results) Recent Labs    03/30/17 2014  BNP 30.6    ProBNP (last 3 results) Recent Labs    03/01/17 1036  PROBNP 10.0    CBG: No results for input(s): GLUCAP in the last 168 hours.  Recent Results (from the past 240 hour(s))  Urine Culture     Status: None   Collection Time: 03/30/17 11:33 PM  Result Value Ref Range Status   Specimen Description URINE, CLEAN CATCH  Final   Special Requests Normal  Final   Culture   Final    NO GROWTH Performed at [facility], [address], [place], [place] [address]    Report Status 04/01/2017 FINAL  Final     Studies: No results found.  Scheduled Meds: . budesonide (PULMICORT) nebulizer solution  0.25 mg Nebulization BID  . docusate sodium  100 mg Oral BID  . furosemide  40 mg Intravenous BID  . morphine  15 mg Oral Q12H  . pantoprazole  40 mg Oral Daily  . polyethylene glycol  17 g Oral Daily  . senna-docusate  1 tablet Oral BID  . sucralfate  1 g Oral TID WC & HS    Continuous Infusions: . heparin 2,300 Units/hr (04/01/17 0329)     Time spent: 35 mins,  I have personally reviewed and interpreted on  04/01/2017 daily labs,  imagings as discussed above under date review session and assessment and plans.  I reviewed all nursing notes, pharmacy notes, consultant notes,  vitals, pertinent old records  I have discussed plan of  care as described above with RN , patient on 04/01/2017   Florencia Reasons MD, PhD  [facility] Pager [phone]. If 7PM-7AM, please contact night-coverage at [email or web address], password [facility] 04/01/2017, 9:16 AM  LOS: 2 days

## 2017-04-01 NOTE — Progress Notes (Signed)
  Echocardiogram 2D Echocardiogram has been performed.  Victor Hayden [name] 04/01/2017, 11:29 AM

## 2017-04-01 NOTE — Progress Notes (Signed)
Bilateral lower extremity venous duplex has been completed. Negative for obvious DVT.  04/01/17 10:23 AM Victor Hayden RVT

## 2017-04-01 NOTE — Progress Notes (Signed)
Pt. Without oxygen in place and saturations 89%.  Educated on importance of leaving oxygen in place until saturations are improved.  Pt. States that he understands and will comply. Placed on [place] 2 lpm

## 2017-04-02 ENCOUNTER — Inpatient Hospital Stay (HOSPITAL_COMMUNITY): Payer: Medicaid Other

## 2017-04-02 ENCOUNTER — Other Ambulatory Visit: Payer: Self-pay

## 2017-04-02 ENCOUNTER — Encounter (HOSPITAL_COMMUNITY): Payer: Self-pay

## 2017-04-02 ENCOUNTER — Telehealth: Payer: Self-pay | Admitting: Pulmonary Disease

## 2017-04-02 DIAGNOSIS — R609 Edema, unspecified: Secondary | ICD-10-CM

## 2017-04-02 LAB — CBC
HEMATOCRIT: 21.6 % — AB (ref 39.0–52.0)
HEMOGLOBIN: 7.2 g/dL — AB (ref 13.0–17.0)
MCH: 27.8 pg (ref 26.0–34.0)
MCHC: 33.3 g/dL (ref 30.0–36.0)
MCV: 83.4 fL (ref 78.0–100.0)
Platelets: 79 10*3/uL — ABNORMAL LOW (ref 150–400)
RBC: 2.59 MIL/uL — AB (ref 4.22–5.81)
RDW: 18.2 % — ABNORMAL HIGH (ref 11.5–15.5)
WBC: 4.9 10*3/uL (ref 4.0–10.5)

## 2017-04-02 LAB — BASIC METABOLIC PANEL
ANION GAP: 7 (ref 5–15)
BUN: 28 mg/dL — ABNORMAL HIGH (ref 6–20)
CHLORIDE: 98 mmol/L — AB (ref 101–111)
CO2: 23 mmol/L (ref 22–32)
Calcium: 7.3 mg/dL — ABNORMAL LOW (ref 8.9–10.3)
Creatinine, Ser: 1.43 mg/dL — ABNORMAL HIGH (ref 0.61–1.24)
GFR calc non Af Amer: 57 mL/min — ABNORMAL LOW (ref >60)
Glucose, Bld: 120 mg/dL — ABNORMAL HIGH (ref 65–99)
POTASSIUM: 3.5 mmol/L (ref 3.5–5.1)
SODIUM: 128 mmol/L — AB (ref 135–145)

## 2017-04-02 LAB — HEPARIN LEVEL (UNFRACTIONATED)
HEPARIN UNFRACTIONATED: 0.26 [IU]/mL — AB (ref 0.30–0.70)
HEPARIN UNFRACTIONATED: 0.37 [IU]/mL (ref 0.30–0.70)

## 2017-04-02 LAB — OCCULT BLOOD X 1 CARD TO LAB, STOOL: Fecal Occult Bld: NEGATIVE

## 2017-04-02 MED ORDER — IOPAMIDOL (ISOVUE-300) INJECTION 61%: 15.0000 mL | Freq: Once | INTRAVENOUS | Status: DC | PRN

## 2017-04-02 MED ORDER — HEPARIN BOLUS VIA INFUSION
2000.0000 [IU] | Freq: Once | INTRAVENOUS | Status: AC
  Administered 2017-04-02: 2000 [IU] via INTRAVENOUS
  Filled 2017-04-02: qty 2000

## 2017-04-02 MED ORDER — IOPAMIDOL (ISOVUE-300) INJECTION 61%
100.0000 mL | Freq: Once | INTRAVENOUS | Status: AC | PRN
  Administered 2017-04-02: 100 mL via INTRAVENOUS

## 2017-04-02 MED ORDER — HEPARIN (PORCINE) IN NACL 100-0.45 UNIT/ML-% IJ SOLN
2500.0000 [IU]/h | INTRAMUSCULAR | Status: DC
  Administered 2017-04-02: 2500 [IU]/h via INTRAVENOUS
  Filled 2017-04-02 (×2): qty 250

## 2017-04-02 MED ORDER — IOPAMIDOL (ISOVUE-300) INJECTION 61%
INTRAVENOUS | Status: AC
  Filled 2017-04-02: qty 30

## 2017-04-02 MED ORDER — TECHNETIUM TC 99M DIETHYLENETRIAME-PENTAACETIC ACID
30.0000 | Freq: Once | INTRAVENOUS | Status: AC | PRN
  Administered 2017-04-02: 30 via INTRAVENOUS

## 2017-04-02 MED ORDER — IOPAMIDOL (ISOVUE-300) INJECTION 61%
INTRAVENOUS | Status: AC
  Filled 2017-04-02: qty 100

## 2017-04-02 MED ORDER — TECHNETIUM TO 99M ALBUMIN AGGREGATED
4.2000 | Freq: Once | INTRAVENOUS | Status: AC | PRN
  Administered 2017-04-02: 4.2 via INTRAVENOUS

## 2017-04-02 NOTE — Telephone Encounter (Signed)
Noted and sent to MR as well as Raquel Sarna for [place]!Marland Kitchen

## 2017-04-02 NOTE — Telephone Encounter (Signed)
Please let Dr. Chase Caller know pt has been admitted to [facility].  This is in reference to phone note from 03/20/17.

## 2017-04-02 NOTE — Progress Notes (Signed)
PROGRESS NOTE  Victor Hayden WUJ:[id number] DOB: 1970-09-25 DOA: 03/30/2017 PCP: Mackie Pai, PA-C  HPI/Recap of past 24 hours:  Reports lasix did not help with the leg edema Reports scrotal edema, scrotum hurts when he gets up and walk  He reports able to sleep better last night, report dry cough and right sided chest pain, and left sided pleuritic chest pain  He is started on 2liter oxygen, he is not in respiratory distress, no fever  Mother who is a Furniture conservator/restorer at bedside  Assessment/Plan: Active Problems:   Kaposi's sarcoma ([place])   Chest pain   Dysuria   Anemia   Thrombocytopenia ([place])   Pulmonary embolism ([place])   Kaposi's sarcoma of multiple organs (HCC)   Dyspnea (presenting symptom) -With chest pain, tachypnea, sinus tachycardia on presentation, no hypoxia, blood pressure stable -CTA questionable PE, he is Started on heparin drip, Venous Doppler no DVT, V/Q scan low probability. Echocardiogram lvef wnl, right ventrical/pulmonary artery unremarkable/no significant valvular abmormalities -pulmonary/critical care consulted, heparin drip stopped after negative VQ scan  -CTA with ill defined alveolar and nodular opacities with bilateral effusions and some interlobular septal thickening is nonspecific. Kaposi's sarcoma involving the lungs could have this appearance. However, infectious processes are also possible. -Pulmonary consulted for possible bronchoscopy  -Plan for Lt thoracentesis 12/04 and possible bronchoscopy. Pulmonary/critical care input greatly appreciated.  Bilateral lower extremity edema -Reports chronic -Home dose Lasix has not been helping, venous Doppler no DVT - did not improve with IV Lasix, iv lasix discontinued on 12/3 due to worsening of renal function.    Scrotal edema: -scrotum US pending -elevate scrotum   Extensive soft tissue stranding throughout the anterior and middle mediastinum is stable. There is a similar process in  the bilateral axilla and upper abdomen which is also new. The underlying etiology of these findings is unclear. Volume overload is a possibility. A process secondary to the patient's Kaposi sarcoma is not excluded but the findings are unusual. Albumin 2.1 He received iv lasix initially, lasix stopped on 12/3 due to elevated cr. 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  -He presented with disseminated disease with multiple nodules and plaques in his upper and lower extremities, trunk as well as lymphatic involvement. He has also possible disease in the lung in the gut. He is HIV negative. -He started on chemotherapy with  doxorubicin on Sep 28, 2016 -Chemotherapy was held on his last follow-up visit on March 23, 2017, due to his worsening anemia. status post blood transfusion on November 28 -oncology input appreciated, staging ct scan ordered by oncology  Cancer pain; on MS Contin and as needed IV Dilaudid for now, patient reports good pain relief able to finally sleep.  Mild hyponatremia Initially thought  from volume overload, sodium did not improve with Lasix Lasix stopped on 12/3 due to worsening of cr Repeat lab in am  Thrombocytopenia in the setting of chemo and malignnacy monitor  Anemia in the setting of chemo and malignancy status post blood transfusion on November 28 Positive H. Pylori, test done on November 1.  Report finished the treatment.  FTT: Generalized weakness, will benefit from PT eval once pulmonary status improves.  Code Status: DNR  Family Communication: patient and mother at bedside  Disposition Plan: not ready for discharge   Consultants:  Oncology  pulmonology  Procedures:  Plan for Lt thoracentesis 12/04.  Possible bronchoscopy  Antibiotics:  none   Objective: BP 118/64 (BP Location: Left Arm)  Pulse 76   Temp 98 F (36.7 C) (Oral)   Resp 16   Ht 5\' 10"  (1.778 m)   Wt 93 kg (205 lb)   SpO2  97%   BMI 29.41 kg/m   Intake/Output Summary (Last 24 hours) at 04/02/2017 0800 Last data filed at 04/01/2017 1700 Gross per 24 hour  Intake 715.75 ml  Output 1250 ml  Net -534.25 ml   Filed Weights   03/30/17 1925  Weight: 93 kg (205 lb)    Exam: Patient is examined daily including today on 04/02/2017, exams remain the same as of yesterday except that has changed    General: frail, alert ,NAD  Cardiovascular: Mild sinus tachycardia  Respiratory: CTABL  Abdomen: Soft/ND/NT, positive BS  Musculoskeletal: bilateral lower extremity pitting  Edema, skin is hard and dark in color  Neuro: alert  Data Reviewed: Basic Metabolic Panel: Recent Labs  Lab 03/30/17 2014 04/01/17 0153 04/02/17 0352  NA 131* 129* 128*  K 3.5 3.9 3.5  CL 100* 98* 98*  CO2 24 23 23   GLUCOSE 138* 131* 120*  BUN 17 22* 28*  CREATININE 1.02 1.09 1.43*  CALCIUM 7.6* 7.5* 7.3*  MG  --  1.8  --    Liver Function Tests: Recent Labs  Lab 03/30/17 2014  AST 34  ALT 12*  ALKPHOS 56  BILITOT 0.9  PROT 5.5*  ALBUMIN 2.1*   No results for input(s): LIPASE, AMYLASE in the last 168 hours. No results for input(s): AMMONIA in the last 168 hours. CBC: Recent Labs  Lab 03/30/17 2014 03/31/17 0824 04/01/17 0153 04/02/17 0352  WBC 4.9 4.7 5.6 4.9  NEUTROABS 3.3  --   --   --   HGB 9.2* 8.8* 8.5* 7.2*  HCT 28.8* 26.9* 25.8* 21.6*  MCV 84.0 84.1 83.5 83.4  PLT 107* 91* 94* 79*   Cardiac Enzymes:   Recent Labs  Lab 03/31/17 0049  TROPONINI <0.03   BNP (last 3 results) Recent Labs    03/30/17 2014  BNP 30.6    ProBNP (last 3 results) Recent Labs    03/01/17 1036  PROBNP 10.0    CBG: No results for input(s): GLUCAP in the last 168 hours.  Recent Results (from the past 240 hour(s))  Urine Culture     Status: None   Collection Time: 03/30/17 11:33 PM  Result Value Ref Range Status   Specimen Description URINE, CLEAN CATCH  Final   Special Requests Normal  Final   Culture    Final    NO GROWTH Performed at [facility], [address], [place], [place] [address]    Report Status 04/01/2017 FINAL  Final     Studies: Dg Chest 2 View  Result Date: 04/01/2017 CLINICAL DATA:  Shortness of breath. Dry cough. Right-sided chest pain. History of Kaposi's sarcoma. EXAM: CHEST  2 VIEW COMPARISON:  Chest CT March 30, 2017 and chest x-ray March 30, 2017 FINDINGS: No pneumothorax. Mild cardiomegaly. The hila and mediastinum are unchanged. The bilateral pulmonary opacities and small effusions are unchanged since March 30, 2017. The findings are nonspecific on this chest x-ray there were better characterized on the recent chest CT. IMPRESSION: No interval change in bilateral pulmonary opacities or small effusions. Electronically Signed   By: Dorise Bullion III M.D   On: 04/01/2017 14:52    Scheduled Meds: . budesonide (PULMICORT) nebulizer solution  0.25 mg Nebulization BID  . docusate sodium  100 mg Oral BID  . morphine  15 mg Oral Q12H  . pantoprazole  40 mg Oral Daily  . polyethylene glycol  17 g Oral Daily  . senna-docusate  1 tablet Oral BID  . sucralfate  1 g Oral TID WC & HS    Continuous Infusions: . heparin 2,500 Units/hr (04/02/17 0708)     Time spent: 35 mins,  I have personally reviewed and interpreted on  04/02/2017 daily labs,  imagings as discussed above under date review session and assessment and plans.  I reviewed all nursing notes, pharmacy notes, consultant notes,  vitals, pertinent old records  I have discussed plan of care as described above with RN , patient on 04/02/2017   Florencia Reasons MD, PhD  [facility] Pager [phone]. If 7PM-7AM, please contact night-coverage at [email or web address], password [facility] 04/02/2017, 8:00 AM  LOS: 3 days

## 2017-04-02 NOTE — Progress Notes (Signed)
ptt .26.   Heparin 2000 unit bolus given and rate increased to 2500 units / hr

## 2017-04-02 NOTE — Progress Notes (Signed)
PT Cancellation Note  Patient Details Name: Victor Hayden MRN: [id number] DOB: April 15, 1971   Cancelled Treatment:    Reason Eval/Treat Not Completed: Fatigue/lethargy limiting ability to participate, patient reports that he has been to 1 test and has another. Check back tomorrow.   [name] 04/02/2017, 4:25 PM [name] PT [phone]

## 2017-04-02 NOTE — Progress Notes (Signed)
IP PROGRESS NOTE  Subjective:   Events in the last 48 hours noted.  Patient is known to me with history of Kaposi's sarcoma was diagnosed April 2018.  He presented with disseminated disease and HIV negative status.  He was treated with Doxil every 4 weeks since Sep 28, 2016.  He achieved a partial response although he is clinically has been declining in the last 2 months.  He was hospitalized on 03/30/2017 with symptoms of dyspnea as well as failure to thrive.  CT scan of the chest suggestive of PE although the quality of study was limited.  It is unclear if he has progression of disease from his Kaposi's sarcoma.  Clinically he continues to have issues with lower extremity swelling and discomfort.  He is overall quite debilitated and his performance status is declining.  Objective:  Vital signs in last 24 hours: Temp:  [98 F (36.7 C)-98.8 F (37.1 C)] 98 F (36.7 C) (12/03 0500) Pulse Rate:  [76-124] 76 (12/03 0500) Resp:  [16-22] 16 (12/03 0500) BP: (114-120)/(63-89) 118/64 (12/03 0500) SpO2:  [89 %-97 %] 96 % (12/03 0825) Weight change:  Last BM Date: 03/31/17  Intake/Output from previous day: 12/02 0701 - 12/03 0700 In: 715.8 [P.O.:480; I.V.:235.8] Out: 1250 [Urine:1250] Alert, awake gentleman appears chronically ill. Mouth: mucous membranes moist, pharynx normal without lesions Resp: clear to auscultation bilaterally Cardio: regular rate and rhythm, S1, S2 normal, no murmur, click, rub or gallop GI: soft, non-tender; bowel sounds normal; no masses,  no organomegaly Extremities: Edema noted bilaterally to the level of the thigh.  Portacath/PICC-without erythema  Lab Results: Recent Labs    04/01/17 0153 04/02/17 0352  WBC 5.6 4.9  HGB 8.5* 7.2*  HCT 25.8* 21.6*  PLT 94* 79*    BMET Recent Labs    04/01/17 0153 04/02/17 0352  NA 129* 128*  K 3.9 3.5  CL 98* 98*  CO2 23 23  GLUCOSE 131* 120*  BUN 22* 28*  CREATININE 1.09 1.43*  CALCIUM 7.5* 7.3*     Studies/Results: Dg Chest 2 View  Result Date: 04/01/2017 CLINICAL DATA:  Shortness of breath. Dry cough. Right-sided chest pain. History of Kaposi's sarcoma. EXAM: CHEST  2 VIEW COMPARISON:  Chest CT March 30, 2017 and chest x-ray March 30, 2017 FINDINGS: No pneumothorax. Mild cardiomegaly. The hila and mediastinum are unchanged. The bilateral pulmonary opacities and small effusions are unchanged since March 30, 2017. The findings are nonspecific on this chest x-ray there were better characterized on the recent chest CT. IMPRESSION: No interval change in bilateral pulmonary opacities or small effusions. Electronically Signed   By: Dorise Bullion III M.D   On: 04/01/2017 14:52    Medications: I have reviewed the patient's current medications.  Assessment/Plan:  46 year old gentleman with the following issues:  1.  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  He presented with diffuse cutaneous manifestation as well as abdominal adenopathy.  He received Doxil between May 2018 and November 2018.  He had a reasonable response initially but now complaining of symptoms of failure to thrive progressive weakness and worsening edema.  Etiology of his symptoms are unclear.  This could be related to progression of disease on Doxil versus complications related to Doxil treatment.  I recommend obtaining a CT scan of the abdomen and pelvis to complete the staging workup at that time.  I appreciate pulmonary input on possible bronchoscopy to complete the staging for possible Kaposi's involvement of the lung.  2.  Possible  pulmonary embolism: He is treated with heparin at this time and I see no objections to this approach.  3.  Lower extremity edema: Could be related to worsening Kaposi's sarcoma.  He is currently on Lasix with little improvement.  We will continue to follow his progress and comment on his prognosis after his CT scan.   LOS: 3 days   Zola Button 04/02/2017, 10:22  AM

## 2017-04-02 NOTE — Progress Notes (Signed)
Pt had episode of feeling hot and flushed, seeing floaters, and severe thigh pain--lasting adout 5 min.  Stated the thigh pain felt like a cramp.  Assisted pt to stand at bedside and the pain resolved.

## 2017-04-02 NOTE — Telephone Encounter (Signed)
LMOM TCB x3 - informed patient that per office protocol, on the 3rd attempt the message is closed but we will happily reopen the message and relay MR's recommendations when he returns our call   Will sign off

## 2017-04-02 NOTE — Progress Notes (Signed)
ANTICOAGULATION CONSULT NOTE - Follow-Up Consult  Pharmacy Consult for IV heparin Indication: pulmonary embolus  Allergies  Allergen Reactions  . Bee Venom Anaphylaxis    Patient Measurements: Height: 5\' 10"  (177.8 cm) Weight: 205 lb (93 kg) IBW/kg (Calculated) : 73 Heparin Dosing Weight: 91.8 kg  Vital Signs:    Labs: Recent Labs    03/30/17 2014 03/31/17 0049 03/31/17 0824  04/01/17 0153 04/01/17 0944 04/01/17 1622 04/02/17 0352  HGB 9.2*  --  8.8*  --  8.5*  --   --  7.2*  HCT 28.8*  --  26.9*  --  25.8*  --   --  21.6*  PLT 107*  --  91*  --  94*  --   --  79*  APTT 28  --   --   --   --   --   --   --   LABPROT 13.9  --   --   --   --   --   --   --   INR 1.08  --   --   --   --   --   --   --   HEPARINUNFRC  --   --  0.15*   < > 0.24* 0.44 0.54 0.26*  CREATININE 1.02  --   --   --  1.09  --   --  1.43*  TROPONINI  --  <0.03  --   --   --   --   --   --    < > = values in this interval not displayed.    Estimated Creatinine Clearance: 72.4 mL/min (A) (by C-G formula based on SCr of 1.43 mg/dL (H)).   Medical History: Past Medical History:  Diagnosis Date  . kaposi sarcoma dx'd 08/2016    Assessment: 22 yoM c/o swelling, pain in left leg and SOB. CTa of chest: findings are suspicious but not definitive for PE. Pharmacy consulted to assist with dosing of heparin for possible PE.  Baseline coags: PT/INR: 13.9/1.08, aPTT 28 seconds. CBC: Hgb low at 9.2, Pltc low at 107K. Note that patient received 2 units PRBCs on 11/28 as outpatient.   Bilateral lower extremity venous duplex negative for obvious DVT per vascular lab notes.   Today, 04/02/17:   0944 heparin level = 0.44 units/mL, now therapeutic on heparin infusion at 2300 units/hr  CBC: Hgb decreased to 8.5, Pltc slightly improved to 94K  No bleeding or complications of therapy reported per nursing 12/2 - heparin level remains therapeutic at 0.54 units/ml with infusion at 2300 units/hr - No  bleeding/complications reported.  Today, 12/3 -0352 HL=0.26 below goal, no infusion or bleeding issues per RN   Goal of Therapy:  Heparin level 0.3-0.7 units/ml Monitor platelets by anticoagulation protocol: Yes   Plan:   Rebolus with 2000 units IV now  Increase heparin drip to 2500 units/hr  Daily CBC and heparin level while on heparin infusion.   Monitor closely for s/sx of bleeding.    Dorrene German 04/02/2017 6:54 AM

## 2017-04-02 NOTE — Progress Notes (Signed)
PULMONARY / CRITICAL CARE MEDICINE   Name: Victor Hayden MRN: [id number] DOB: August 14, 1970    ADMISSION DATE:  03/30/2017 CONSULTATION DATE: 03/31/2017  REFERRING PROVIDER: Dr. 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  CT angio chest concerning for pulmonary embolism.  SUBJECTIVE:  Still with some discomfort  VITAL SIGNS: BP 118/64 (BP Location: Left Arm)   Pulse 76   Temp 98 F (36.7 C) (Oral)   Resp 16   Ht 5\' 10"  (1.778 m)   Wt 205 lb (93 kg)   SpO2 96%   BMI 29.41 kg/m   INTAKE / OUTPUT: I/O last 3 completed shifts: In: 956.6 [P.O.:480; I.V.:476.6] Out: 1550 [Urine:1550]  PHYSICAL EXAMINATION:  General: 46 year old male patient currently resting in no acute distress.    HEENT: Normocephalic atraumatic no jugular venous distention Pulmonary: Clear to auscultation decreased bases Cardiac: Regular rate and rhythm without murmur rub or gallop Abdomen: Soft nontender no organomegaly GU: Voiding quantity sufficient Neuro: Awake alert oriented no focal deficits Extremities/musculoskeletal lower extremity edema noted   LABS:  BMET Recent Labs  Lab 03/30/17 2014 04/01/17 0153 04/02/17 0352  NA 131* 129* 128*  K 3.5 3.9 3.5  CL 100* 98* 98*  CO2 24 23 23   BUN 17 22* 28*  CREATININE 1.02 1.09 1.43*  GLUCOSE 138* 131* 120*    Electrolytes Recent Labs  Lab 03/30/17 2014 04/01/17 0153 04/02/17 0352  CALCIUM 7.6* 7.5* 7.3*  MG  --  1.8  --     CBC Recent Labs  Lab 03/31/17 0824 04/01/17 0153 04/02/17 0352  WBC 4.7 5.6 4.9  HGB 8.8* 8.5* 7.2*  HCT 26.9* 25.8* 21.6*  PLT 91* 94* 79*    Coag's Recent Labs  Lab 03/30/17 2014  APTT 28  INR 1.08    Sepsis Markers No results for input(s): LATICACIDVEN, PROCALCITON, O2SATVEN in the last 168 hours.  ABG No results for input(s):  PHART, PCO2ART, PO2ART in the last 168 hours.  Liver Enzymes Recent Labs  Lab 03/30/17 2014  AST 34  ALT 12*  ALKPHOS 56  BILITOT 0.9  ALBUMIN 2.1*    Cardiac Enzymes Recent Labs  Lab 03/31/17 0049  TROPONINI <0.03    Glucose No results for input(s): GLUCAP in the last 168 hours.  Imaging Dg Chest 2 View  Result Date: 04/01/2017 CLINICAL DATA:  Shortness of breath. Dry cough. Right-sided chest pain. History of Kaposi's sarcoma. EXAM: CHEST  2 VIEW COMPARISON:  Chest CT March 30, 2017 and chest x-ray March 30, 2017 FINDINGS: No pneumothorax. Mild cardiomegaly. The hila and mediastinum are unchanged. The bilateral pulmonary opacities and small effusions are unchanged since March 30, 2017. The findings are nonspecific on this chest x-ray there were better characterized on the recent chest CT. IMPRESSION: No interval change in bilateral pulmonary opacities or small effusions. Electronically Signed   By: Dorise Bullion III M.D   On: 04/01/2017 14:52     STUDIES:  CT angio chest 12/01 >> poor study, possible PE b/l, b/l nodular infiltrates, Lt > Rt pleural effusion Doppler legs b/l 12/02 >> no DVT Echo 12/02 >> EF 55 to 60%, normal RV function and PA pressure VQ scan 12/3: normal perfusion/ decreased Lower lobe ventilation  DISCUSSION: 46 yo male with pleuritic type chest pain.  CT angio chest was suggestive of PE, but was poor study.  Echo unremarkable and doppler legs b/l negative.  Still has increased risk of thromboembolic disease in setting of Kaposi's sarcoma.  He also has Lt > Rt pleural effusion which has progressed since imaging studies from May 2018.  ASSESSMENT / PLAN:  Pleuritic chest pain w/ normal perfusion on v/q scan and Left Pleural effusion. -stop heparin gtt -left thoracentesis 12/4   Kaposi's sarcoma. - f/u with oncology  Erick Colace ACNP-BC [place] Pager # [phone] OR # [phone] if no answer

## 2017-04-03 ENCOUNTER — Inpatient Hospital Stay (HOSPITAL_COMMUNITY): Payer: Medicaid Other

## 2017-04-03 DIAGNOSIS — J910 Malignant pleural effusion: Secondary | ICD-10-CM

## 2017-04-03 DIAGNOSIS — R627 Adult failure to thrive: Secondary | ICD-10-CM

## 2017-04-03 DIAGNOSIS — R601 Generalized edema: Secondary | ICD-10-CM

## 2017-04-03 DIAGNOSIS — G893 Neoplasm related pain (acute) (chronic): Secondary | ICD-10-CM

## 2017-04-03 DIAGNOSIS — I2699 Other pulmonary embolism without acute cor pulmonale: Secondary | ICD-10-CM

## 2017-04-03 DIAGNOSIS — D696 Thrombocytopenia, unspecified: Secondary | ICD-10-CM

## 2017-04-03 DIAGNOSIS — D649 Anemia, unspecified: Secondary | ICD-10-CM

## 2017-04-03 DIAGNOSIS — C467 Kaposi's sarcoma of other sites: Secondary | ICD-10-CM

## 2017-04-03 DIAGNOSIS — J90 Pleural effusion, not elsewhere classified: Secondary | ICD-10-CM

## 2017-04-03 LAB — BODY FLUID CELL COUNT WITH DIFFERENTIAL
Eos, Fluid: 1 %
Lymphs, Fluid: 2 %
Monocyte-Macrophage-Serous Fluid: 5 % — ABNORMAL LOW (ref 50–90)
Neutrophil Count, Fluid: 1 % (ref 0–25)
Other Cells, Fluid: 91 %
WBC FLUID: 2660 uL — AB (ref 0–1000)

## 2017-04-03 LAB — PROTEIN, PLEURAL OR PERITONEAL FLUID

## 2017-04-03 LAB — BASIC METABOLIC PANEL
Anion gap: 5 (ref 5–15)
BUN: 26 mg/dL — AB (ref 6–20)
CHLORIDE: 98 mmol/L — AB (ref 101–111)
CO2: 25 mmol/L (ref 22–32)
CREATININE: 1.49 mg/dL — AB (ref 0.61–1.24)
Calcium: 7.4 mg/dL — ABNORMAL LOW (ref 8.9–10.3)
GFR, EST NON AFRICAN AMERICAN: 54 mL/min — AB (ref >60)
Glucose, Bld: 106 mg/dL — ABNORMAL HIGH (ref 65–99)
Potassium: 3.8 mmol/L (ref 3.5–5.1)
SODIUM: 128 mmol/L — AB (ref 135–145)

## 2017-04-03 LAB — CBC
HCT: 22.1 % — ABNORMAL LOW (ref 39.0–52.0)
Hemoglobin: 7.3 g/dL — ABNORMAL LOW (ref 13.0–17.0)
MCH: 27.5 pg (ref 26.0–34.0)
MCHC: 33 g/dL (ref 30.0–36.0)
MCV: 83.4 fL (ref 78.0–100.0)
PLATELETS: 65 10*3/uL — AB (ref 150–400)
RBC: 2.65 MIL/uL — AB (ref 4.22–5.81)
RDW: 17.9 % — AB (ref 11.5–15.5)
WBC: 3.7 10*3/uL — AB (ref 4.0–10.5)

## 2017-04-03 LAB — CHOLESTEROL, TOTAL: Cholesterol: 60 mg/dL (ref 0–200)

## 2017-04-03 LAB — LACTATE DEHYDROGENASE: LDH: 164 U/L (ref 98–192)

## 2017-04-03 LAB — LACTATE DEHYDROGENASE, PLEURAL OR PERITONEAL FLUID: LD, Fluid: 1137 U/L — ABNORMAL HIGH (ref 3–23)

## 2017-04-03 LAB — PROTEIN, TOTAL: Total Protein: 4.9 g/dL — ABNORMAL LOW (ref 6.5–8.1)

## 2017-04-03 NOTE — H&P (View-Only) (Signed)
I have been asked to see the patient by Dr. Florencia Reasons, for evaluation and management of left hydronephrosis.  History of present illness: 46 year old male with a history of disseminated Kaposi's sarcoma who was admitted for generalized weakness and anasarca.  He was also having significant shortness of breath.  Workup demonstrated a large pleural effusion and left hydroureteronephrosis down to the mid ureter with no clear evidence of intrinsic obstruction.  The patient has also noted a slight decline in his renal function over the past several days.  The patient is not really complaining of any left-sided pain.  Further, the patient has not had any nausea or vomiting.  His appetite has been diminished.  He does state that he had some dysuria on initial presentation.  This seems to come and go.  Currently he is voiding without any dysuria or significant lower urinary tract symptoms.  He does have severe scrotal edema, and this seems to be 1 of his biggest complaints.  This is been chronic and ongoing.  Review of systems: A 12 point comprehensive review of systems was obtained and is negative unless otherwise stated in the history of present illness.  Patient Active Problem List   Diagnosis Date Noted  . Pleural effusion on left   . Pleuritic chest pain 03/30/2017  . Dysuria 03/30/2017  . Anemia 03/30/2017  . Thrombocytopenia ([place]) 03/30/2017  . Pulmonary embolism ([place]) 03/30/2017  . Kaposi's sarcoma of multiple organs ([place]) 03/30/2017  . Dyspnea and respiratory abnormalities 03/20/2017  . Neoplastic malignant related fatigue 03/20/2017  . Physical deconditioning 03/20/2017  . Goals of care, counseling/discussion 09/19/2016  . Kaposi's sarcoma ([place]) 09/19/2016    No current facility-administered medications on file prior to encounter.    Current Outpatient Medications on File Prior to Encounter  Medication Sig Dispense Refill  . albuterol (PROVENTIL HFA;VENTOLIN HFA) 108 (90 Base) MCG/ACT  inhaler Inhale 2 puffs every 6 (six) hours as needed into the lungs for wheezing or shortness of breath. 1 Inhaler 2  . beclomethasone (QVAR) 40 MCG/ACT inhaler Inhale 2 puffs into the lungs 2 (two) times daily. 1 Inhaler 2  . benzonatate (TESSALON) 100 MG capsule Take 1 capsule (100 mg total) by mouth 3 (three) times daily as needed for cough. 21 capsule 0  . esomeprazole (NEXIUM) 20 MG capsule Take 20 mg daily at 12 noon by mouth.    . furosemide (LASIX) 20 MG tablet Take 1 tablet (20 mg total) daily by mouth. 30 tablet 0  . magic mouthwash w/lidocaine SOLN Take 5 mLs by mouth 4 (four) times daily as needed for mouth pain. 240 mL 3  . nystatin cream (MYCOSTATIN) Apply 1 application topically 2 (two) times daily. 30 g 0  . prochlorperazine (COMPAZINE) 10 MG tablet Take 1 tablet (10 mg total) by mouth every 6 (six) hours as needed for nausea or vomiting. 30 tablet 0  . sucralfate (CARAFATE) 1 GM/10ML suspension Take 10 mLs (1 g total) by mouth 4 (four) times daily -  with meals and at bedtime. 420 mL 0    Past Medical History:  Diagnosis Date  . kaposi sarcoma dx'd 08/2016    History reviewed. No pertinent surgical history.  Social History   Tobacco Use  . Smoking status: Former Smoker    Packs/day: 1.00    Years: 29.00    Pack years: 29.00    Types: Cigarettes    Start date: 1988    Last attempt to quit: 01/18/2017    Years since  quitting: 0.2  . Smokeless tobacco: Never Used  Substance Use Topics  . Alcohol use: No  . Drug use: No    History reviewed. No pertinent family history.  PE: Vitals:   04/03/17 0601 04/03/17 0757 04/03/17 1744 04/03/17 1952  BP: (!) 101/53  110/60   Pulse: (!) 108  (!) 111   Resp: 17  18   Temp: 99.1 F (37.3 C)  98.4 F (36.9 C)   TempSrc: Oral  Oral   SpO2: 91% (!) 87% 97% 96%  Weight:      Height:       Patient appears to be in no acute distress  patient is alert and oriented x3 Atraumatic normocephalic head No cervical or  supraclavicular lymphadenopathy appreciated No increased work of breathing, no audible wheezes/rhonchi Regular sinus rhythm/rate Abdomen is soft, nontender, nondistended, no CVA or suprapubic tenderness Severe scrotal edema with tenderness Lower extremities very edematous with Kaposi patches over the anterior aspect of his thigh Grossly neurologically intact   Recent Labs    04/01/17 0153 04/02/17 0352 04/03/17 0356  WBC 5.6 4.9 3.7*  HGB 8.5* 7.2* 7.3*  HCT 25.8* 21.6* 22.1*   Recent Labs    04/01/17 0153 04/02/17 0352 04/03/17 0356  NA 129* 128* 128*  K 3.9 3.5 3.8  CL 98* 98* 98*  CO2 23 23 25   GLUCOSE 131* 120* 106*  BUN 22* 28* 26*  CREATININE 1.09 1.43* 1.49*  CALCIUM 7.5* 7.3* 7.4*   No results for input(s): LABPT, INR in the last 72 hours. No results for input(s): LABURIN in the last 72 hours. Results for orders placed or performed during the hospital encounter of 03/30/17  Urine Culture     Status: None   Collection Time: 03/30/17 11:33 PM  Result Value Ref Range Status   Specimen Description URINE, CLEAN CATCH  Final   Special Requests Normal  Final   Culture   Final    NO GROWTH Performed at [facility], [place] [address] [place], [place] [address]    Report Status 04/01/2017 FINAL  Final  Body fluid culture     Status: None (Preliminary result)   Collection Time: 04/03/17 12:06 PM  Result Value Ref Range Status   Specimen Description PLEURAL  Final   Special Requests NONE  Final   Gram Stain   Final    ABUNDANT WBC PRESENT, PREDOMINANTLY MONONUCLEAR NO ORGANISMS SEEN Performed at [facility], [place] [address] [place], [place] [address]    Culture PENDING  Incomplete   Report Status PENDING  Incomplete    Imaging: I have reviewed the patient's images showing mild hydroureteronephrosis to the mid ureter with no clear evidence of intrinsic obstruction.  There is some edema and congestion in the mid left retroperitoneal region which may  be causing compression.  Imp: The patient has left-sided hydroureteronephrosis down to the mid ureter.  He has had mild renal dysfunction, although I do not think this is associated with this mild hydronephrosis.  However, the patient is interesting in staying aggressive for the treatment of his disease and is likely to undergo more chemo therapy.  Recommendations: I spoke to the patient about the treatment options which would include conservative management, nephrostomy tube, or ureteral stent placement.  We discussed the risks and the benefits of all 3 modalities of treatment.  After a lengthy discussion, we have opted to proceed with left ureteral stent placement.  We will try to get this done ASAP.  I have made the patient n.p.o.  Thank you for involving me in this patient's care, I will continue to follow along   .Louis Meckel W

## 2017-04-03 NOTE — Procedures (Signed)
Thoracentesis Procedure Note  Pre-operative Diagnosis: left pleural effusion   Post-operative Diagnosis: same  Indications: evaluation and treatment of pleural effusion   Procedure Details  Consent: Informed consent was obtained. Risks of the procedure were discussed including: infection, bleeding, pain, pneumothorax.  Under sterile conditions the patient was positioned. Betadine solution and sterile drapes were utilized.  1% buffered lidocaine was used to anesthetize the  rib space which was identified via real time [place]. Fluid was obtained without any difficulties and minimal blood loss.  A dressing was applied to the wound and wound care instructions were provided.   Findings 1100 ml of concentrated orange pleural fluid was obtained. A sample was sent to Pathology for cytogenetics, flow, and cell counts, as well as for infection analysis.  Complications:  None; patient tolerated the procedure well.          Condition: stable  Plan A follow up chest x-ray was ordered.   Erick Colace ACNP-BC [place] Pager # [phone] OR # [phone] if no answer

## 2017-04-03 NOTE — Progress Notes (Signed)
PROGRESS NOTE  Victor Hayden OEU:[id number] DOB: 1970/05/23 DOA: 03/30/2017 PCP: Mackie Pai, PA-C  HPI/Recap of past 24 hours:   had thoracentesis with removal of 1liter  blood tinged fluids. Report less chest pain Report continued leg pain  Mother at bedside  Assessment/Plan: Active Problems:   Kaposi's sarcoma [facility])   Pleuritic chest pain   Dysuria   Anemia   Thrombocytopenia (HCC)   Pulmonary embolism (HCC)   Kaposi's sarcoma of multiple organs (HCC)   Dyspnea (presenting symptom) -With chest pain, tachypnea, sinus tachycardia on presentation, no hypoxia, blood pressure stable -CTA questionable PE, he is Started on heparin drip, Venous Doppler no DVT, V/Q scan low probability. Echocardiogram lvef wnl, right ventrical/pulmonary artery unremarkable/no significant valvular abmormalities -pulmonary/critical care consulted, heparin drip stopped after negative VQ scan  -CTA with ill defined alveolar and nodular opacities with bilateral effusions and some interlobular septal thickening is nonspecific. Kaposi's sarcoma involving the lungs could have this appearance. However, infectious processes are also possible. -Pulmonary consulted for possible bronchoscopy  -s/p Lt thoracentesis 12/04 , fluids study pending. Pulmonary/critical care to decide on possible bronchoscopy.  Bilateral lower extremity edema -Reports chronic -Home dose Lasix has not been helping, venous Doppler no DVT - did not improve with IV Lasix, iv lasix discontinued on 12/3 due to worsening of renal function.   --edema from poor nutrition and possible poor venous return from tumor obstruction?  Scrotal edema: -scrotum US no acute findings -elevate scrotum  -edema from poor nutrition and possible poor venous return from tumor obstruction?  Extensive soft tissue stranding throughout the anterior and middle mediastinum is stable. There is a similar process in the bilateral axilla and upper  abdomen which is also new. The underlying etiology of these findings is unclear. Volume overload is a possibility. A process secondary to the patient's Kaposi sarcoma is not excluded but the findings are unusual. Albumin 2.1 He received iv lasix initially, lasix stopped on 12/3 due to elevated cr. Oncology following  Left hydroureteronephrosis: With worsening of cr  Urology consulted.   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  -He presented with disseminated disease with multiple nodules and plaques in his upper and lower extremities, trunk as well as lymphatic involvement. He has also possible disease in the lung in the gut. He is HIV negative. -He started on chemotherapy with  doxorubicin on Sep 28, 2016 -Chemotherapy was held on his last follow-up visit on March 23, 2017, due to his worsening anemia. status post blood transfusion on November 28 -oncology input appreciated, staging ct scan ordered by oncology  Cancer pain; on MS Contin and as needed IV Dilaudid for now, patient reports good pain relief able to finally sleep.  Mild hyponatremia Initially thought  from volume overload, sodium did not improve with Lasix Lasix stopped on 12/3 due to worsening of cr Repeat lab in am  Thrombocytopenia in the setting of chemo and malignnacy plt continue to drop , will check  HIT panel. hemOnc following  Anemia in the setting of chemo and malignancy status post blood transfusion on November 28  Positive H. Pylori, test done on November 1.  Report finished the treatment.  FTT: Generalized weakness, over all poor prognosis.  Family agreed to talk to palliative care.  Code Status: DNR  Family Communication: patient and mother at bedside  Disposition Plan: not ready for discharge   Consultants:  Oncology  Pulmonology  Urology  Palliative care  Procedures:  Lt thoracentesis 12/04.  Possible  bronchoscopy  Antibiotics:  none   Objective: BP (!) 101/53 (BP Location: Left Arm)   Pulse (!) 108   Temp 99.1 F (37.3 C) (Oral)   Resp 17   Ht 5\' 10"  (1.778 m)   Wt 93 kg (205 lb)   SpO2 (!) 87% Comment: will place on 2 lpm [place] post neb  BMI 29.41 kg/m   Intake/Output Summary (Last 24 hours) at 04/03/2017 0837 Last data filed at 04/02/2017 1800 Gross per 24 hour  Intake 480 ml  Output 300 ml  Net 180 ml   Filed Weights   03/30/17 1925  Weight: 93 kg (205 lb)    Exam: Patient is examined daily including today on 04/03/2017, exams remain the same as of yesterday except that has changed    General: frail, alert ,NAD  Cardiovascular: Mild sinus tachycardia  Respiratory: CTABL  Abdomen: Soft/ND/NT, positive BS  Musculoskeletal: bilateral lower extremity pitting  Edema, skin is hard and dark in color  Neuro: alert   Data Reviewed: Basic Metabolic Panel: Recent Labs  Lab 03/30/17 2014 04/01/17 0153 04/02/17 0352 04/03/17 0356  NA 131* 129* 128* 128*  K 3.5 3.9 3.5 3.8  CL 100* 98* 98* 98*  CO2 24 23 23 25   GLUCOSE 138* 131* 120* 106*  BUN 17 22* 28* 26*  CREATININE 1.02 1.09 1.43* 1.49*  CALCIUM 7.6* 7.5* 7.3* 7.4*  MG  --  1.8  --   --    Liver Function Tests: Recent Labs  Lab 03/30/17 2014  AST 34  ALT 12*  ALKPHOS 56  BILITOT 0.9  PROT 5.5*  ALBUMIN 2.1*   No results for input(s): LIPASE, AMYLASE in the last 168 hours. No results for input(s): AMMONIA in the last 168 hours. CBC: Recent Labs  Lab 03/30/17 2014 03/31/17 0824 04/01/17 0153 04/02/17 0352 04/03/17 0356  WBC 4.9 4.7 5.6 4.9 3.7*  NEUTROABS 3.3  --   --   --   --   HGB 9.2* 8.8* 8.5* 7.2* 7.3*  HCT 28.8* 26.9* 25.8* 21.6* 22.1*  MCV 84.0 84.1 83.5 83.4 83.4  PLT 107* 91* 94* 79* 65*   Cardiac Enzymes:   Recent Labs  Lab 03/31/17 0049  TROPONINI <0.03   BNP (last 3 results) Recent Labs    03/30/17 2014  BNP 30.6    ProBNP (last 3 results) Recent Labs     03/01/17 1036  PROBNP 10.0    CBG: No results for input(s): GLUCAP in the last 168 hours.  Recent Results (from the past 240 hour(s))  Urine Culture     Status: None   Collection Time: 03/30/17 11:33 PM  Result Value Ref Range Status   Specimen Description URINE, CLEAN CATCH  Final   Special Requests Normal  Final   Culture   Final    NO GROWTH Performed at [facility], [address], [place], [place] [address]    Report Status 04/01/2017 FINAL  Final     Studies: US Scrotum  Result Date: 04/02/2017 CLINICAL DATA:  Scrotal edema.  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 EXAM: SCROTAL ULTRASOUND DOPPLER ULTRASOUND OF THE TESTICLES TECHNIQUE: Complete ultrasound examination of the testicles, epididymis, and other scrotal structures was performed. Color and spectral Doppler ultrasound were also utilized to evaluate blood flow to the testicles. COMPARISON:  None. FINDINGS: Right testicle Measurements: 4.3 x 2.1 x 2.5 cm. 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 Left testicle Measurements: 3.9 x 2 x 2.3 cm. Scattered testicular microlithiasis without  focal mass. Right epididymis: Normal in size and appearance to the extent visualized. The right epididymal head was not optimally visualized. Left epididymis:  Normal in size and appearance. Hydrocele:  None visualized. Varicocele:  None visualized. Pulsed Doppler interrogation of both testes demonstrates normal low resistance arterial and venous waveforms bilaterally. Diffuse scrotal edema, nonspecific in etiology. IMPRESSION: 1. 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 Current literature suggests that testicular microlithiasis is not a significant independent risk factor for development of testicular carcinoma, and that follow up imaging is not warranted in the absence of other risk factors. Monthly testicular self-examination and annual physical exams are considered appropriate surveillance. If patient has other  risk factors for testicular carcinoma, then referral to Urology should be considered. (Reference: DeCastro, et al.: A 5-Year Follow up Study of Asymptomatic Men with Testicular Microlithiasis. J Urol 2008; 371:6967-8938.) 2. No torsion of either testicle. 3. Nonspecific scrotal edema. Electronically Signed   By: Ashley Royalty M.D.   On: 04/02/2017 19:51   Ct Abdomen Pelvis W Contrast  Result Date: 04/02/2017 CLINICAL DATA:  Scrotal swelling with generalized body aches and swelling. History of Kaposi's sarcoma. EXAM: CT ABDOMEN AND PELVIS WITH CONTRAST TECHNIQUE: Multidetector CT imaging of the abdomen and pelvis was performed using the standard protocol following bolus administration of intravenous contrast. CONTRAST:  141mL ISOVUE-300 IOPAMIDOL (ISOVUE-300) INJECTION 61% COMPARISON:  CT chest 03/30/2017.  Abdomen and pelvis CT 09/27/2016. FINDINGS: Lower chest: Heart size normal. Small to moderate left and small right pleural effusions. Central peribronchovascular airspace disease with bilateral pleural nodularity. Hepatobiliary: No focal abnormality within the liver parenchyma. Gallbladder is distended but otherwise unremarkable. No intrahepatic or extrahepatic biliary dilation. Pancreas: No focal mass lesion. No dilatation of the main duct. No intraparenchymal cyst. No peripancreatic edema. Spleen: No splenomegaly. No focal mass lesion. Adrenals/Urinary Tract: No adrenal nodule or mass. Right kidney and ureter unremarkable. Mild to moderate left hydroureteronephrosis identified with decreased perfusion to the left kidney compatible with obstructive uropathy. Left ureter is distended to the level of the aortic bifurcation with no hydroureter distal to that location. Bladder is decompressed. Stomach/Bowel: Stomach is nondistended. No gastric wall thickening. No evidence of outlet obstruction. Duodenum is normally positioned as is the ligament of Treitz. No small bowel wall thickening. No small bowel dilatation.  The terminal ileum is normal. The appendix is normal. No gross colonic mass. No colonic wall thickening. No substantial diverticular change. Vascular/Lymphatic: No abdominal aortic aneurysm. No abdominal aortic atherosclerotic calcification. Portal vein and superior mesenteric vein are patent. Celiac axis and SMA are patent. There is no gastrohepatic or hepatoduodenal ligament lymphadenopathy. No intraperitoneal or retroperitoneal lymphadenopathy. Edema/ inflammation is seen in the retroperitoneal soft tissues extending from the celiac axis down into the extraperitoneal space along both pelvic sidewalls. The mild left para-aortic lymphadenopathy seen on the 09/27/2016 exam has been incorporated into this change today and is less evident. Reproductive: The prostate gland and seminal vesicles have normal imaging features. Other: No substantial intraperitoneal free fluid. Musculoskeletal: Fairly marked diffuse body wall edema is seen in the lower chest, abdomen, and pelvis. Skin thickening is associated over the pelvic regions. The edema tracks into the intramuscular spaces of the pelvis and proximal thighs. IMPRESSION: 1. Fairly marked diffuse body wall edema with fluid/edema tracking into the musculature of the upper thighs and pelvis. Associated skin thickening is noted over the pelvis. This is associated with retroperitoneal edema involving the abdomen and extraperitoneal pelvic sidewalls. 2. Mild to moderate left hydroureteronephrosis with decreased perfusion of  the left kidney compatible with obstructive uropathy. The left ureter appears obstructed in its mid segment as it courses through the edematous retroperitoneal tissues, but no obstructing lesion is evident by CT. 3. Small to moderate bilateral pleural effusions with bilateral dependent lower lobe collapse/consolidation. Electronically Signed   By: Misty Stanley M.D.   On: 04/02/2017 19:23   Nm Pulmonary Perf And Vent  Result Date: 04/02/2017 CLINICAL  DATA:  Shortness of breath, chest pain, dyspnea, history Kaposi's sarcoma, prior pulmonary embolism EXAM: NUCLEAR MEDICINE VENTILATION - PERFUSION LUNG SCAN TECHNIQUE: Ventilation images were obtained in multiple projections using inhaled aerosol Tc-19m DTPA. Perfusion images were obtained in multiple projections after intravenous injection of Tc-25m MAA. RADIOPHARMACEUTICALS:  30 mCi Technetium-71m DTPA aerosol inhalation and 4.2 mCi Technetium-85m MAA IV COMPARISON:  None; correlation chest radiograph 04/01/2017 FINDINGS: Ventilation: Central airway deposition of aerosol. Swallowed aerosol within stomach. Minimally inhomogeneous aerosol distribution in RIGHT lung. Diminished ventilation at base of LEFT lower lobe. No additional focal segmental or subsegmental ventilatory defects. Perfusion: Normal perfusion. No segmental or subsegmental perfusion defects. IMPRESSION: Normal perfusion lung scan. Diminished ventilation LEFT lower lobe base. Electronically Signed   By: Lavonia Dana M.D.   On: 04/02/2017 13:53   [place] Scrotom Doppler  Result Date: 04/02/2017 CLINICAL DATA:  Scrotal edema.  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 EXAM: SCROTAL ULTRASOUND DOPPLER ULTRASOUND OF THE TESTICLES TECHNIQUE: Complete ultrasound examination of the testicles, epididymis, and other scrotal structures was performed. Color and spectral Doppler ultrasound were also utilized to evaluate blood flow to the testicles. COMPARISON:  None. FINDINGS: Right testicle Measurements: 4.3 x 2.1 x 2.5 cm. 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 Left testicle Measurements: 3.9 x 2 x 2.3 cm. 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 Right epididymis: Normal in size and appearance to the extent visualized. The right epididymal head was not optimally visualized. Left epididymis:  Normal in size and appearance. Hydrocele:  None visualized. Varicocele:  None visualized. Pulsed Doppler interrogation of both testes demonstrates  normal low resistance arterial and venous waveforms bilaterally. Diffuse scrotal edema, nonspecific in etiology. IMPRESSION: 1. 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 Current literature suggests that testicular microlithiasis is not a significant independent risk factor for development of testicular carcinoma, and that follow up imaging is not warranted in the absence of other risk factors. Monthly testicular self-examination and annual physical exams are considered appropriate surveillance. If patient has other risk factors for testicular carcinoma, then referral to Urology should be considered. (Reference: DeCastro, et al.: A 5-Year Follow up Study of Asymptomatic Men with Testicular Microlithiasis. J Urol 2008; 710:6269-4854.) 2. No torsion of either testicle. 3. Nonspecific scrotal edema. Electronically Signed   By: Ashley Royalty M.D.   On: 04/02/2017 19:51    Scheduled Meds: . budesonide (PULMICORT) nebulizer solution  0.25 mg Nebulization BID  . docusate sodium  100 mg Oral BID  . morphine  15 mg Oral Q12H  . pantoprazole  40 mg Oral Daily  . polyethylene glycol  17 g Oral Daily  . senna-docusate  1 tablet Oral BID  . sucralfate  1 g Oral TID WC & HS    Continuous Infusions:   Time spent: 35 mins, case discussed with pulmonary critical care, urology, oncology I have personally reviewed and interpreted on  04/03/2017 daily labs, imagings as discussed above under date review session and assessment and plans.  I reviewed all nursing notes, pharmacy notes, consultant notes,  vitals, pertinent old records  I have discussed plan of care  as described above with RN , patient and family on 04/03/2017   Florencia Reasons MD, PhD  [facility] Pager [phone]. If 7PM-7AM, please contact night-coverage at [email or web address], password [facility] 04/03/2017, 8:37 AM  LOS: 4 days

## 2017-04-03 NOTE — Progress Notes (Signed)
PT Cancellation Note  Patient Details Name: Nickey Kloepfer Kuriakose MRN: [id number] DOB: 1970/11/21   Cancelled Treatment:    Reason Eval/Treat Not Completed: Pain limiting ability to participate(pt in pain from thoracentesis, hold PT today per RN request. Will check again tomorrow. )   Philomena Doheny 04/03/2017, 1:57 PM [phone]

## 2017-04-03 NOTE — Progress Notes (Signed)
PULMONARY / CRITICAL CARE MEDICINE   Name: Victor Hayden MRN: [id number] DOB: October 17, 1970    ADMISSION DATE:  03/30/2017 CONSULTATION DATE: 03/31/2017  REFERRING PROVIDER: Dr. 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  CT angio chest concerning for pulmonary embolism.  SUBJECTIVE:  Coughing more VITAL SIGNS: BP (!) 101/53 (BP Location: Left Arm)   Pulse (!) 108   Temp 99.1 F (37.3 C) (Oral)   Resp 17   Ht 5\' 10"  (1.778 m)   Wt 205 lb (93 kg)   SpO2 (!) 87% Comment: will place on 2 lpm [place] post neb  BMI 29.41 kg/m   INTAKE / OUTPUT: I/O last 3 completed shifts: In: 600 [P.O.:600] Out: 300 [Urine:300]  PHYSICAL EXAMINATION:  General: 46 year old male patient currently resting in bed.  He does have increased cough compared to yesterday but he is in no acute distress. HEENT: Normocephalic atraumatic no jugular venous distention. Pulmonary: Clear to auscultation, some cough which is increased compared to day prior, decreased left base. Cardiac: Regular rate and rhythm without murmur soft nontender no organomegaly Derm: Several Kaposi lesions over back Extremities/musculoskeletal: Warm dry positive lower extremity edema Neuro/psych: Awake alert no focal deficits.   LABS:  BMET Recent Labs  Lab 04/01/17 0153 04/02/17 0352 04/03/17 0356  NA 129* 128* 128*  K 3.9 3.5 3.8  CL 98* 98* 98*  CO2 23 23 25   BUN 22* 28* 26*  CREATININE 1.09 1.43* 1.49*  GLUCOSE 131* 120* 106*    Electrolytes Recent Labs  Lab 04/01/17 0153 04/02/17 0352 04/03/17 0356  CALCIUM 7.5* 7.3* 7.4*  MG 1.8  --   --     CBC Recent Labs  Lab 04/01/17 0153 04/02/17 0352 04/03/17 0356  WBC 5.6 4.9 3.7*  HGB 8.5* 7.2* 7.3*  HCT 25.8* 21.6* 22.1*  PLT 94* 79* 65*    Coag's Recent Labs  Lab 03/30/17 2014  APTT 28  INR 1.08     Sepsis Markers No results for input(s): LATICACIDVEN, PROCALCITON, O2SATVEN in the last 168 hours.  ABG No results for input(s): PHART, PCO2ART, PO2ART in the last 168 hours.  Liver Enzymes Recent Labs  Lab 03/30/17 2014  AST 34  ALT 12*  ALKPHOS 56  BILITOT 0.9  ALBUMIN 2.1*    Cardiac Enzymes Recent Labs  Lab 03/31/17 0049  TROPONINI <0.03    Glucose No results for input(s): GLUCAP in the last 168 hours.  Imaging US Scrotum  Result Date: 04/02/2017 CLINICAL DATA:  Scrotal edema.  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 EXAM: SCROTAL ULTRASOUND DOPPLER ULTRASOUND OF THE TESTICLES TECHNIQUE: Complete ultrasound examination of the testicles, epididymis, and other scrotal structures was performed. Color and spectral Doppler ultrasound were also utilized to evaluate blood flow to the testicles. COMPARISON:  None. FINDINGS: Right testicle Measurements: 4.3 x 2.1 x 2.5 cm. 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 Left testicle Measurements: 3.9 x 2 x 2.3 cm. 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 Right epididymis: Normal in size and appearance to the extent visualized. The right epididymal head was not optimally visualized. Left epididymis:  Normal in size and appearance. Hydrocele:  None visualized. Varicocele:  None visualized. Pulsed Doppler interrogation of both testes demonstrates normal low resistance arterial and venous waveforms bilaterally. Diffuse scrotal edema, nonspecific in etiology. IMPRESSION: 1. 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 Current literature suggests that testicular microlithiasis  is not a significant independent risk factor for development of testicular carcinoma, and that follow up imaging is not warranted in the absence of other risk factors. Monthly testicular self-examination and annual physical exams are considered appropriate surveillance. If patient has other risk factors for testicular carcinoma, then  referral to Urology should be considered. (Reference: DeCastro, et al.: A 5-Year Follow up Study of Asymptomatic Men with Testicular Microlithiasis. J Urol 2008; 562:1308-6578.) 2. No torsion of either testicle. 3. Nonspecific scrotal edema. Electronically Signed   By: Ashley Royalty M.D.   On: 04/02/2017 19:51   Ct Abdomen Pelvis W Contrast  Result Date: 04/02/2017 CLINICAL DATA:  Scrotal swelling with generalized body aches and swelling. History of Kaposi's sarcoma. EXAM: CT ABDOMEN AND PELVIS WITH CONTRAST TECHNIQUE: Multidetector CT imaging of the abdomen and pelvis was performed using the standard protocol following bolus administration of intravenous contrast. CONTRAST:  168mL ISOVUE-300 IOPAMIDOL (ISOVUE-300) INJECTION 61% COMPARISON:  CT chest 03/30/2017.  Abdomen and pelvis CT 09/27/2016. FINDINGS: Lower chest: Heart size normal. Small to moderate left and small right pleural effusions. Central peribronchovascular airspace disease with bilateral pleural nodularity. Hepatobiliary: No focal abnormality within the liver parenchyma. Gallbladder is distended but otherwise unremarkable. No intrahepatic or extrahepatic biliary dilation. Pancreas: No focal mass lesion. No dilatation of the main duct. No intraparenchymal cyst. No peripancreatic edema. Spleen: No splenomegaly. No focal mass lesion. Adrenals/Urinary Tract: No adrenal nodule or mass. Right kidney and ureter unremarkable. Mild to moderate left hydroureteronephrosis identified with decreased perfusion to the left kidney compatible with obstructive uropathy. Left ureter is distended to the level of the aortic bifurcation with no hydroureter distal to that location. Bladder is decompressed. Stomach/Bowel: Stomach is nondistended. No gastric wall thickening. No evidence of outlet obstruction. Duodenum is normally positioned as is the ligament of Treitz. No small bowel wall thickening. No small bowel dilatation. The terminal ileum is normal. The appendix is  normal. No gross colonic mass. No colonic wall thickening. No substantial diverticular change. Vascular/Lymphatic: No abdominal aortic aneurysm. No abdominal aortic atherosclerotic calcification. Portal vein and superior mesenteric vein are patent. Celiac axis and SMA are patent. There is no gastrohepatic or hepatoduodenal ligament lymphadenopathy. No intraperitoneal or retroperitoneal lymphadenopathy. Edema/ inflammation is seen in the retroperitoneal soft tissues extending from the celiac axis down into the extraperitoneal space along both pelvic sidewalls. The mild left para-aortic lymphadenopathy seen on the 09/27/2016 exam has been incorporated into this change today and is less evident. Reproductive: The prostate gland and seminal vesicles have normal imaging features. Other: No substantial intraperitoneal free fluid. Musculoskeletal: Fairly marked diffuse body wall edema is seen in the lower chest, abdomen, and pelvis. Skin thickening is associated over the pelvic regions. The edema tracks into the intramuscular spaces of the pelvis and proximal thighs. IMPRESSION: 1. Fairly marked diffuse body wall edema with fluid/edema tracking into the musculature of the upper thighs and pelvis. Associated skin thickening is noted over the pelvis. This is associated with retroperitoneal edema involving the abdomen and extraperitoneal pelvic sidewalls. 2. Mild to moderate left hydroureteronephrosis with decreased perfusion of the left kidney compatible with obstructive uropathy. The left ureter appears obstructed in its mid segment as it courses through the edematous retroperitoneal tissues, but no obstructing lesion is evident by CT. 3. Small to moderate bilateral pleural effusions with bilateral dependent lower lobe collapse/consolidation. Electronically Signed   By: Misty Stanley M.D.   On: 04/02/2017 19:23   Nm Pulmonary Perf And Vent  Result Date: 04/02/2017 CLINICAL DATA:  Shortness of breath, chest pain,  dyspnea, history Kaposi's sarcoma, prior pulmonary embolism EXAM: NUCLEAR MEDICINE VENTILATION - PERFUSION LUNG SCAN TECHNIQUE: Ventilation images were obtained in multiple projections using inhaled aerosol Tc-80m DTPA. Perfusion images were obtained in multiple projections after intravenous injection of Tc-54m MAA. RADIOPHARMACEUTICALS:  30 mCi Technetium-54m DTPA aerosol inhalation and 4.2 mCi Technetium-82m MAA IV COMPARISON:  None; correlation chest radiograph 04/01/2017 FINDINGS: Ventilation: Central airway deposition of aerosol. Swallowed aerosol within stomach. Minimally inhomogeneous aerosol distribution in RIGHT lung. Diminished ventilation at base of LEFT lower lobe. No additional focal segmental or subsegmental ventilatory defects. Perfusion: Normal perfusion. No segmental or subsegmental perfusion defects. IMPRESSION: Normal perfusion lung scan. Diminished ventilation LEFT lower lobe base. Electronically Signed   By: Lavonia Dana M.D.   On: 04/02/2017 13:53   [place] Scrotom Doppler  Result Date: 04/02/2017 CLINICAL DATA:  Scrotal edema.  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 EXAM: SCROTAL ULTRASOUND DOPPLER ULTRASOUND OF THE TESTICLES TECHNIQUE: Complete ultrasound examination of the testicles, epididymis, and other scrotal structures was performed. Color and spectral Doppler ultrasound were also utilized to evaluate blood flow to the testicles. COMPARISON:  None. FINDINGS: Right testicle Measurements: 4.3 x 2.1 x 2.5 cm. 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 Left testicle Measurements: 3.9 x 2 x 2.3 cm. 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 Right epididymis: Normal in size and appearance to the extent visualized. The right epididymal head was not optimally visualized. Left epididymis:  Normal in size and appearance. Hydrocele:  None visualized. Varicocele:  None visualized. Pulsed Doppler interrogation of both testes demonstrates normal low resistance arterial and venous  waveforms bilaterally. Diffuse scrotal edema, nonspecific in etiology. IMPRESSION: 1. 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 Current literature suggests that testicular microlithiasis is not a significant independent risk factor for development of testicular carcinoma, and that follow up imaging is not warranted in the absence of other risk factors. Monthly testicular self-examination and annual physical exams are considered appropriate surveillance. If patient has other risk factors for testicular carcinoma, then referral to Urology should be considered. (Reference: DeCastro, et al.: A 5-Year Follow up Study of Asymptomatic Men with Testicular Microlithiasis. J Urol 2008; 423:5361-4431.) 2. No torsion of either testicle. 3. Nonspecific scrotal edema. Electronically Signed   By: Ashley Royalty M.D.   On: 04/02/2017 19:51     STUDIES:  CT angio chest 12/01 >> poor study, possible PE b/l, b/l nodular infiltrates, Lt > Rt pleural effusion Doppler legs b/l 12/02 >> no DVT Echo 12/02 >> EF 55 to 60%, normal RV function and PA pressure VQ scan 12/3: normal perfusion/ decreased Lower lobe ventilation  DISCUSSION: 46 yo male with pleuritic type chest pain.  CT angio chest was suggestive of PE, but was poor study.  Echo unremarkable and doppler legs b/l negative.  Still has increased risk of thromboembolic disease in setting of Kaposi's sarcoma.  He also has Lt > Rt pleural effusion which has progressed since imaging studies from May 2018.  ASSESSMENT / PLAN:  Pleuritic chest pain w/ normal perfusion on v/q scan and Left Pleural effusion. For diagnostic and therapeutic left thoracentesis today  Kaposi's sarcoma. Per oncology  Mild AK I with evidence of left hydroureteral nephrosis Hyponatremia Obstructive uropathy Per internal medicine; would likely benefit from urology consult  Anemia without evidence of bleeding.  I do not think that this is pulmonary source Per internal  medicine  Erick Colace ACNP-BC [place] Pager # [phone] OR # [phone] if no answer

## 2017-04-03 NOTE — Consult Note (Signed)
I have been asked to see the patient by Dr. Florencia Reasons, for evaluation and management of left hydronephrosis.  History of present illness: 46 year old male with a history of disseminated Kaposi's sarcoma who was admitted for generalized weakness and anasarca.  He was also having significant shortness of breath.  Workup demonstrated a large pleural effusion and left hydroureteronephrosis down to the mid ureter with no clear evidence of intrinsic obstruction.  The patient has also noted a slight decline in his renal function over the past several days.  The patient is not really complaining of any left-sided pain.  Further, the patient has not had any nausea or vomiting.  His appetite has been diminished.  He does state that he had some dysuria on initial presentation.  This seems to come and go.  Currently he is voiding without any dysuria or significant lower urinary tract symptoms.  He does have severe scrotal edema, and this seems to be 1 of his biggest complaints.  This is been chronic and ongoing.  Review of systems: A 12 point comprehensive review of systems was obtained and is negative unless otherwise stated in the history of present illness.  Patient Active Problem List   Diagnosis Date Noted  . Pleural effusion on left   . Pleuritic chest pain 03/30/2017  . Dysuria 03/30/2017  . Anemia 03/30/2017  . Thrombocytopenia ([place]) 03/30/2017  . Pulmonary embolism ([place]) 03/30/2017  . Kaposi's sarcoma of multiple organs ([place]) 03/30/2017  . Dyspnea and respiratory abnormalities 03/20/2017  . Neoplastic malignant related fatigue 03/20/2017  . Physical deconditioning 03/20/2017  . Goals of care, counseling/discussion 09/19/2016  . Kaposi's sarcoma ([place]) 09/19/2016    No current facility-administered medications on file prior to encounter.    Current Outpatient Medications on File Prior to Encounter  Medication Sig Dispense Refill  . albuterol (PROVENTIL HFA;VENTOLIN HFA) 108 (90 Base) MCG/ACT  inhaler Inhale 2 puffs every 6 (six) hours as needed into the lungs for wheezing or shortness of breath. 1 Inhaler 2  . beclomethasone (QVAR) 40 MCG/ACT inhaler Inhale 2 puffs into the lungs 2 (two) times daily. 1 Inhaler 2  . benzonatate (TESSALON) 100 MG capsule Take 1 capsule (100 mg total) by mouth 3 (three) times daily as needed for cough. 21 capsule 0  . esomeprazole (NEXIUM) 20 MG capsule Take 20 mg daily at 12 noon by mouth.    . furosemide (LASIX) 20 MG tablet Take 1 tablet (20 mg total) daily by mouth. 30 tablet 0  . magic mouthwash w/lidocaine SOLN Take 5 mLs by mouth 4 (four) times daily as needed for mouth pain. 240 mL 3  . nystatin cream (MYCOSTATIN) Apply 1 application topically 2 (two) times daily. 30 g 0  . prochlorperazine (COMPAZINE) 10 MG tablet Take 1 tablet (10 mg total) by mouth every 6 (six) hours as needed for nausea or vomiting. 30 tablet 0  . sucralfate (CARAFATE) 1 GM/10ML suspension Take 10 mLs (1 g total) by mouth 4 (four) times daily -  with meals and at bedtime. 420 mL 0    Past Medical History:  Diagnosis Date  . kaposi sarcoma dx'd 08/2016    History reviewed. No pertinent surgical history.  Social History   Tobacco Use  . Smoking status: Former Smoker    Packs/day: 1.00    Years: 29.00    Pack years: 29.00    Types: Cigarettes    Start date: 1988    Last attempt to quit: 01/18/2017    Years since  quitting: 0.2  . Smokeless tobacco: Never Used  Substance Use Topics  . Alcohol use: No  . Drug use: No    History reviewed. No pertinent family history.  PE: Vitals:   04/03/17 0601 04/03/17 0757 04/03/17 1744 04/03/17 1952  BP: (!) 101/53  110/60   Pulse: (!) 108  (!) 111   Resp: 17  18   Temp: 99.1 F (37.3 C)  98.4 F (36.9 C)   TempSrc: Oral  Oral   SpO2: 91% (!) 87% 97% 96%  Weight:      Height:       Patient appears to be in no acute distress  patient is alert and oriented x3 Atraumatic normocephalic head No cervical or  supraclavicular lymphadenopathy appreciated No increased work of breathing, no audible wheezes/rhonchi Regular sinus rhythm/rate Abdomen is soft, nontender, nondistended, no CVA or suprapubic tenderness Severe scrotal edema with tenderness Lower extremities very edematous with Kaposi patches over the anterior aspect of his thigh Grossly neurologically intact   Recent Labs    04/01/17 0153 04/02/17 0352 04/03/17 0356  WBC 5.6 4.9 3.7*  HGB 8.5* 7.2* 7.3*  HCT 25.8* 21.6* 22.1*   Recent Labs    04/01/17 0153 04/02/17 0352 04/03/17 0356  NA 129* 128* 128*  K 3.9 3.5 3.8  CL 98* 98* 98*  CO2 23 23 25   GLUCOSE 131* 120* 106*  BUN 22* 28* 26*  CREATININE 1.09 1.43* 1.49*  CALCIUM 7.5* 7.3* 7.4*   No results for input(s): LABPT, INR in the last 72 hours. No results for input(s): LABURIN in the last 72 hours. Results for orders placed or performed during the hospital encounter of 03/30/17  Urine Culture     Status: None   Collection Time: 03/30/17 11:33 PM  Result Value Ref Range Status   Specimen Description URINE, CLEAN CATCH  Final   Special Requests Normal  Final   Culture   Final    NO GROWTH Performed at [facility], [place] [address], [place], [place] [address]    Report Status 04/01/2017 FINAL  Final  Body fluid culture     Status: None (Preliminary result)   Collection Time: 04/03/17 12:06 PM  Result Value Ref Range Status   Specimen Description PLEURAL  Final   Special Requests NONE  Final   Gram Stain   Final    ABUNDANT WBC PRESENT, PREDOMINANTLY MONONUCLEAR NO ORGANISMS SEEN Performed at [facility], [place] [address], [place], [place] [address]    Culture PENDING  Incomplete   Report Status PENDING  Incomplete    Imaging: I have reviewed the patient's images showing mild hydroureteronephrosis to the mid ureter with no clear evidence of intrinsic obstruction.  There is some edema and congestion in the mid left retroperitoneal region which may  be causing compression.  Imp: The patient has left-sided hydroureteronephrosis down to the mid ureter.  He has had mild renal dysfunction, although I do not think this is associated with this mild hydronephrosis.  However, the patient is interesting in staying aggressive for the treatment of his disease and is likely to undergo more chemo therapy.  Recommendations: I spoke to the patient about the treatment options which would include conservative management, nephrostomy tube, or ureteral stent placement.  We discussed the risks and the benefits of all 3 modalities of treatment.  After a lengthy discussion, we have opted to proceed with left ureteral stent placement.  We will try to get this done ASAP.  I have made the patient n.p.o.  Thank you for involving me in this patient's care, I will continue to follow along   .Louis Meckel W

## 2017-04-03 NOTE — Progress Notes (Signed)
IP PROGRESS NOTE  Subjective:   Victor Hayden reports feeling better after thoracentesis performed earlier today.  1100 cc removed from the left lung was sent for analysis.  He is able to ambulate without significant dyspnea.  He had a good bowel movement as well.  He still complains of scrotal swelling and generalized anasarca.  Objective:  Vital signs in last 24 hours: Temp:  [99.1 F (37.3 C)-99.5 F (37.5 C)] 99.1 F (37.3 C) (12/04 0601) Pulse Rate:  [95-108] 108 (12/04 0601) Resp:  [15-17] 17 (12/04 0601) BP: (101-116)/(53-57) 101/53 (12/04 0601) SpO2:  [87 %-95 %] 87 % (12/04 0757) Weight change:  Last BM Date: 04/02/17  Intake/Output from previous day: 12/03 0701 - 12/04 0700 In: 600 [P.O.:600] Out: 300 [Urine:300] Alert, awake without acute distress. Mouth: mucous membranes moist, pharynx normal without lesions Resp: clear to auscultation bilaterally Cardio: regular rate and rhythm, S1, S2 normal, no murmur, click, rub or gallop GI: soft, non-tender; bowel sounds normal; no masses,  no organomegaly Extremities: Edema noted bilaterally to the level of the thigh.   Lab Results: Recent Labs    04/02/17 0352 04/03/17 0356  WBC 4.9 3.7*  HGB 7.2* 7.3*  HCT 21.6* 22.1*  PLT 79* 65*    BMET Recent Labs    04/02/17 0352 04/03/17 0356  NA 128* 128*  K 3.5 3.8  CL 98* 98*  CO2 23 25  GLUCOSE 120* 106*  BUN 28* 26*  CREATININE 1.43* 1.49*  CALCIUM 7.3* 7.4*      Result Date: 04/02/2017 CLINICAL DATA:  Scrotal edema.  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 EXAM: SCROTAL ULTRASOUND DOPPLER ULTRASOUND OF THE TESTICLES TECHNIQUE: Complete ultrasound examination of the testicles, epididymis, and other scrotal structures was performed. Color and spectral Doppler ultrasound were also utilized to evaluate blood flow to the testicles. COMPARISON:  None. FINDINGS: Right testicle Measurements: 4.3 x 2.1 x 2.5 cm. 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 Left  testicle Measurements: 3.9 x 2 x 2.3 cm. 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 Right epididymis: Normal in size and appearance to the extent visualized. The right epididymal head was not optimally visualized. Left epididymis:  Normal in size and appearance. Hydrocele:  None visualized. Varicocele:  None visualized. Pulsed Doppler interrogation of both testes demonstrates normal low resistance arterial and venous waveforms bilaterally. Diffuse scrotal edema, nonspecific in etiology. IMPRESSION: 1. 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 Current literature suggests that testicular microlithiasis is not a significant independent risk factor for development of testicular carcinoma, and that follow up imaging is not warranted in the absence of other risk factors. Monthly testicular self-examination and annual physical exams are considered appropriate surveillance. If patient has other risk factors for testicular carcinoma, then referral to Urology should be considered. (Reference: DeCastro, et al.: A 5-Year Follow up Study of Asymptomatic Men with Testicular Microlithiasis. J Urol 2008; 500:9381-8299.) 2. No torsion of either testicle. 3. Nonspecific scrotal edema. Electronically Signed   By: Ashley Royalty M.D.   On: 04/02/2017 19:51   Ct Abdomen Pelvis W Contrast  Result Date: 04/02/2017 CLINICAL DATA:  Scrotal swelling with generalized body aches and swelling. History of Kaposi's sarcoma. EXAM: CT ABDOMEN AND PELVIS WITH CONTRAST TECHNIQUE: Multidetector CT imaging of the abdomen and pelvis was performed using the standard protocol following bolus administration of intravenous contrast. CONTRAST:  163mL ISOVUE-300 IOPAMIDOL (ISOVUE-300) INJECTION 61% COMPARISON:  CT chest 03/30/2017.  Abdomen and pelvis CT 09/27/2016. FINDINGS: Lower chest: Heart size normal. Small to moderate left and  small right pleural effusions. Central peribronchovascular airspace disease with bilateral pleural  nodularity. Hepatobiliary: No focal abnormality within the liver parenchyma. Gallbladder is distended but otherwise unremarkable. No intrahepatic or extrahepatic biliary dilation. Pancreas: No focal mass lesion. No dilatation of the main duct. No intraparenchymal cyst. No peripancreatic edema. Spleen: No splenomegaly. No focal mass lesion. Adrenals/Urinary Tract: No adrenal nodule or mass. Right kidney and ureter unremarkable. Mild to moderate left hydroureteronephrosis identified with decreased perfusion to the left kidney compatible with obstructive uropathy. Left ureter is distended to the level of the aortic bifurcation with no hydroureter distal to that location. Bladder is decompressed. Stomach/Bowel: Stomach is nondistended. No gastric wall thickening. No evidence of outlet obstruction. Duodenum is normally positioned as is the ligament of Treitz. No small bowel wall thickening. No small bowel dilatation. The terminal ileum is normal. The appendix is normal. No gross colonic mass. No colonic wall thickening. No substantial diverticular change. Vascular/Lymphatic: No abdominal aortic aneurysm. No abdominal aortic atherosclerotic calcification. Portal vein and superior mesenteric vein are patent. Celiac axis and SMA are patent. There is no gastrohepatic or hepatoduodenal ligament lymphadenopathy. No intraperitoneal or retroperitoneal lymphadenopathy. Edema/ inflammation is seen in the retroperitoneal soft tissues extending from the celiac axis down into the extraperitoneal space along both pelvic sidewalls. The mild left para-aortic lymphadenopathy seen on the 09/27/2016 exam has been incorporated into this change today and is less evident. Reproductive: The prostate gland and seminal vesicles have normal imaging features. Other: No substantial intraperitoneal free fluid. Musculoskeletal: Fairly marked diffuse body wall edema is seen in the lower chest, abdomen, and pelvis. Skin thickening is associated over  the pelvic regions. The edema tracks into the intramuscular spaces of the pelvis and proximal thighs. IMPRESSION: 1. Fairly marked diffuse body wall edema with fluid/edema tracking into the musculature of the upper thighs and pelvis. Associated skin thickening is noted over the pelvis. This is associated with retroperitoneal edema involving the abdomen and extraperitoneal pelvic sidewalls. 2. Mild to moderate left hydroureteronephrosis with decreased perfusion of the left kidney compatible with obstructive uropathy. The left ureter appears obstructed in its mid segment as it courses through the edematous retroperitoneal tissues, but no obstructing lesion is evident by CT. 3. Small to moderate bilateral pleural effusions with bilateral dependent lower lobe collapse/consolidation. Electronically Signed   By: Misty Stanley M.D.   On: 04/02/2017 19:23   Medications: I have reviewed the patient's current medications.  Assessment/Plan:  46 year old gentleman with the following issues:  1.  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  He presented with diffuse cutaneous manifestation as well as abdominal adenopathy.  He received Doxil between May 2018 and November 2018.  He had a reasonable response initially but now complaining of symptoms of failure to thrive progressive weakness and worsening edema.  CT scan of the abdomen and pelvis showed no specific findings to suggest disease progression.  These findings were discussed today with the patient and his family.  Although I see no clear evidence of lymphadenopathy or progression of his cancer, the overall presentation is suggestive of his malignancy getting worse.  Pleural effusion analysis would be helpful in the setting to determine if his malignancy has progressed.  The natural course of this disease was reviewed again with the patient and his family.  He has an incurable malignancy that is likely progressing causing his performance status to  decline and required a recent hospitalization.  I I do not think he will experience full recovery and I am  not optimistic about additional chemotherapy being successful in palliating his cancer.  After discussion today, he would like to be aggressive in his treatment approach which I think is reasonable and treat any reversible etiologies.  I recommend continue supportive management for the time being and will await the results of his fluid cytology for further discussion.   2.  Possible pulmonary embolism: He is treated with heparin at this time and I see no objections to this approach.  3.  Generalized anasarca: Suspect Kaposi's sarcoma progression although the CT scan showed decrease in his lymphadenopathy.  He is currently on Lasix with very little improvement.  4.  Hydronephrosis: He would like to continue with aggressive measures and I recommend urology input regarding this finding.  5.  Thrombocytopenia: No active bleeding noted.  This could be related to his malignancy and acute illness.  No transfusion is needed.  6.  Anemia: His iron is low at 20 with saturation of 11%.  He would benefit from iron replacement oral or intravenous.  7.  We will continue to follow with you moving forward.   LOS: 4 days   Zola Button 04/03/2017, 1:24 PM

## 2017-04-04 ENCOUNTER — Encounter (HOSPITAL_COMMUNITY): Payer: Self-pay | Admitting: Certified Registered"

## 2017-04-04 ENCOUNTER — Inpatient Hospital Stay (HOSPITAL_COMMUNITY): Payer: Medicaid Other | Admitting: Anesthesiology

## 2017-04-04 ENCOUNTER — Inpatient Hospital Stay (HOSPITAL_COMMUNITY): Payer: Medicaid Other

## 2017-04-04 ENCOUNTER — Encounter (HOSPITAL_COMMUNITY)
Admission: EM | Disposition: A | Discharge status: Hospice | Payer: Self-pay | Source: Home / Self Care | Attending: Nephrology

## 2017-04-04 DIAGNOSIS — R0781 Pleurodynia: Secondary | ICD-10-CM

## 2017-04-04 DIAGNOSIS — R0603 Acute respiratory distress: Secondary | ICD-10-CM

## 2017-04-04 DIAGNOSIS — Z515 Encounter for palliative care: Secondary | ICD-10-CM

## 2017-04-04 DIAGNOSIS — Z7189 Other specified counseling: Secondary | ICD-10-CM

## 2017-04-04 HISTORY — PX: CYSTOSCOPY W/ URETERAL STENT PLACEMENT: SHX1429

## 2017-04-04 LAB — CBC WITH DIFFERENTIAL/PLATELET
BASOS ABS: 0 10*3/uL (ref 0.0–0.1)
Basophils Relative: 0 %
EOS PCT: 1 %
Eosinophils Absolute: 0.1 10*3/uL (ref 0.0–0.7)
HEMATOCRIT: 22.6 % — AB (ref 39.0–52.0)
Hemoglobin: 7.5 g/dL — ABNORMAL LOW (ref 13.0–17.0)
LYMPHS PCT: 11 %
Lymphs Abs: 0.6 10*3/uL — ABNORMAL LOW (ref 0.7–4.0)
MCH: 27.8 pg (ref 26.0–34.0)
MCHC: 33.2 g/dL (ref 30.0–36.0)
MCV: 83.7 fL (ref 78.0–100.0)
MONO ABS: 1.4 10*3/uL — AB (ref 0.1–1.0)
MONOS PCT: 27 %
Neutro Abs: 3 10*3/uL (ref 1.7–7.7)
Neutrophils Relative %: 60 %
PLATELETS: 71 10*3/uL — AB (ref 150–400)
RBC: 2.7 MIL/uL — ABNORMAL LOW (ref 4.22–5.81)
RDW: 18 % — AB (ref 11.5–15.5)
WBC: 5 10*3/uL (ref 4.0–10.5)

## 2017-04-04 LAB — BASIC METABOLIC PANEL
Anion gap: 6 (ref 5–15)
BUN: 27 mg/dL — AB (ref 6–20)
CO2: 24 mmol/L (ref 22–32)
Calcium: 7.5 mg/dL — ABNORMAL LOW (ref 8.9–10.3)
Chloride: 97 mmol/L — ABNORMAL LOW (ref 101–111)
Creatinine, Ser: 1.51 mg/dL — ABNORMAL HIGH (ref 0.61–1.24)
GFR calc Af Amer: >60 mL/min (ref >60)
GFR, EST NON AFRICAN AMERICAN: 53 mL/min — AB (ref >60)
GLUCOSE: 126 mg/dL — AB (ref 65–99)
POTASSIUM: 4.1 mmol/L (ref 3.5–5.1)
Sodium: 127 mmol/L — ABNORMAL LOW (ref 135–145)

## 2017-04-04 LAB — NA AND K (SODIUM & POTASSIUM), RAND UR
POTASSIUM UR: 31 mmol/L
SODIUM UR: 79 mmol/L

## 2017-04-04 LAB — PH, BODY FLUID: PH, BODY FLUID: 7.7

## 2017-04-04 LAB — OSMOLALITY, URINE: Osmolality, Ur: 288 mOsm/kg — ABNORMAL LOW (ref 300–900)

## 2017-04-04 SURGERY — CYSTOSCOPY, WITH RETROGRADE PYELOGRAM AND URETERAL STENT INSERTION
Anesthesia: Monitor Anesthesia Care | Laterality: Left

## 2017-04-04 SURGERY — CYSTOSCOPY, WITH RETROGRADE PYELOGRAM AND URETERAL STENT INSERTION
Anesthesia: General | Laterality: Left

## 2017-04-04 MED ORDER — FUROSEMIDE 10 MG/ML IJ SOLN
40.0000 mg | Freq: Two times a day (BID) | INTRAMUSCULAR | Status: DC
  Administered 2017-04-04 – 2017-04-12 (×12): 40 mg via INTRAVENOUS
  Filled 2017-04-04 (×13): qty 4

## 2017-04-04 MED ORDER — SODIUM CHLORIDE 0.9 % IR SOLN
Status: DC | PRN
  Administered 2017-04-04: 1000 mL via INTRAVESICAL

## 2017-04-04 MED ORDER — MIDAZOLAM HCL 2 MG/2ML IJ SOLN
INTRAMUSCULAR | Status: AC
  Filled 2017-04-04: qty 2

## 2017-04-04 MED ORDER — LIDOCAINE HCL 2 % EX GEL
CUTANEOUS | Status: DC | PRN
  Administered 2017-04-04: 1 via URETHRAL

## 2017-04-04 MED ORDER — ALBUMIN HUMAN 25 % IV SOLN
25.0000 g | Freq: Four times a day (QID) | INTRAVENOUS | Status: AC
  Administered 2017-04-04 – 2017-04-06 (×6): 25 g via INTRAVENOUS
  Filled 2017-04-04 (×7): qty 100

## 2017-04-04 MED ORDER — ONDANSETRON HCL 4 MG/2ML IJ SOLN
INTRAMUSCULAR | Status: AC
  Filled 2017-04-04: qty 2

## 2017-04-04 MED ORDER — IOHEXOL 300 MG/ML  SOLN
INTRAMUSCULAR | Status: DC | PRN
  Administered 2017-04-04: 5 mL

## 2017-04-04 MED ORDER — FENTANYL CITRATE (PF) 100 MCG/2ML IJ SOLN
INTRAMUSCULAR | Status: AC
  Filled 2017-04-04: qty 2

## 2017-04-04 MED ORDER — PROPOFOL 10 MG/ML IV BOLUS
INTRAVENOUS | Status: AC
  Filled 2017-04-04: qty 40

## 2017-04-04 MED ORDER — FENTANYL CITRATE (PF) 100 MCG/2ML IJ SOLN
INTRAMUSCULAR | Status: DC | PRN
  Administered 2017-04-04 (×3): 50 ug via INTRAVENOUS

## 2017-04-04 MED ORDER — PROPOFOL 500 MG/50ML IV EMUL
INTRAVENOUS | Status: DC | PRN
  Administered 2017-04-04: 150 ug/kg/min via INTRAVENOUS

## 2017-04-04 MED ORDER — FENTANYL CITRATE (PF) 250 MCG/5ML IJ SOLN
INTRAMUSCULAR | Status: AC
  Filled 2017-04-04: qty 5

## 2017-04-04 MED ORDER — LACTATED RINGERS IV SOLN
INTRAVENOUS | Status: DC | PRN
  Administered 2017-04-04: 07:00:00 via INTRAVENOUS

## 2017-04-04 MED ORDER — LIDOCAINE HCL 2 % EX GEL
CUTANEOUS | Status: AC
  Filled 2017-04-04: qty 5

## 2017-04-04 MED ORDER — LACTATED RINGERS IV SOLN: INTRAVENOUS | Status: DC

## 2017-04-04 MED ORDER — PROPOFOL 10 MG/ML IV BOLUS
INTRAVENOUS | Status: DC | PRN
  Administered 2017-04-04: 50 mg via INTRAVENOUS

## 2017-04-04 MED ORDER — CIPROFLOXACIN IN D5W 400 MG/200ML IV SOLN
400.0000 mg | Freq: Once | INTRAVENOUS | Status: AC
  Administered 2017-04-04: 400 mg via INTRAVENOUS

## 2017-04-04 MED ORDER — DEXAMETHASONE SODIUM PHOSPHATE 10 MG/ML IJ SOLN
INTRAMUSCULAR | Status: AC
  Filled 2017-04-04: qty 1

## 2017-04-04 MED ORDER — FENTANYL CITRATE (PF) 100 MCG/2ML IJ SOLN
25.0000 ug | INTRAMUSCULAR | Status: DC | PRN
  Administered 2017-04-04 (×2): 50 ug via INTRAVENOUS

## 2017-04-04 MED ORDER — CIPROFLOXACIN IN D5W 400 MG/200ML IV SOLN
INTRAVENOUS | Status: AC
  Filled 2017-04-04: qty 200

## 2017-04-04 MED ORDER — BELLADONNA-OPIUM 16.2-30 MG RE SUPP
RECTAL | Status: AC
  Filled 2017-04-04: qty 1

## 2017-04-04 MED ORDER — MIDAZOLAM HCL 5 MG/5ML IJ SOLN
INTRAMUSCULAR | Status: DC | PRN
  Administered 2017-04-04: 2 mg via INTRAVENOUS

## 2017-04-04 MED ORDER — BELLADONNA ALKALOIDS-OPIUM 16.2-60 MG RE SUPP
RECTAL | Status: DC | PRN
  Administered 2017-04-04: 1 via RECTAL

## 2017-04-04 MED ORDER — ONDANSETRON HCL 4 MG/2ML IJ SOLN
INTRAMUSCULAR | Status: DC | PRN
  Administered 2017-04-04: 4 mg via INTRAVENOUS

## 2017-04-04 SURGICAL SUPPLY — 23 items
BAG URO CATCHER STRL LF (MISCELLANEOUS) ×3 IMPLANT
BASKET DAKOTA 1.9FR 11X120 (BASKET) IMPLANT
BASKET ZERO TIP NITINOL 2.4FR (BASKET) IMPLANT
CATH FOLEY 2W COUNCIL 5CC 18FR (CATHETERS) ×3 IMPLANT
CATH FOLEY 2WAY 5CC 16FR (CATHETERS) ×2
CATH URET 5FR 28IN OPEN ENDED (CATHETERS) ×3 IMPLANT
CATH URTH STD 16FR FL 2W DRN (CATHETERS) ×1 IMPLANT
CLOTH BEACON ORANGE TIMEOUT ST (SAFETY) ×3 IMPLANT
COVER FOOTSWITCH UNIV (MISCELLANEOUS) IMPLANT
COVER SURGICAL LIGHT HANDLE (MISCELLANEOUS) ×3 IMPLANT
GLOVE BIOGEL M STRL SZ7.5 (GLOVE) ×3 IMPLANT
GOWN STRL REUS W/TWL XL LVL3 (GOWN DISPOSABLE) ×3 IMPLANT
GUIDEWIRE ANG ZIPWIRE 038X150 (WIRE) IMPLANT
GUIDEWIRE STR DUAL SENSOR (WIRE) ×3 IMPLANT
MANIFOLD NEPTUNE II (INSTRUMENTS) ×3 IMPLANT
PACK CYSTO (CUSTOM PROCEDURE TRAY) ×3 IMPLANT
SHEATH ACCESS URETERAL 24CM (SHEATH) IMPLANT
SHEATH ACCESS URETERAL 54CM (SHEATH) IMPLANT
SHEATH URETERAL 12FRX35CM (MISCELLANEOUS) IMPLANT
STENT POLARIS 6FR 26CM .038 (STENTS) ×3 IMPLANT
TUBING CONNECTING 10 (TUBING) ×2 IMPLANT
TUBING CONNECTING 10' (TUBING) ×1
WIRE COONS/BENSON .038X145CM (WIRE) IMPLANT

## 2017-04-04 NOTE — Progress Notes (Signed)
Consent signed.  Chg bath completed.  Pt stood to urinate.  Mother at bedside.

## 2017-04-04 NOTE — Op Note (Signed)
Preoperative diagnosis:  1. Left ureteral obstruction   Postoperative diagnosis:  1. same   Procedure:  1. Cystoscopy 2. left ureteral stent placement, 26 cm x5 French Polaris stent 3. left retrograde pyelography with interpretation   Surgeon: Ardis Hughs, MD  Anesthesia: General  Complications: None  Intraoperative findings:   #1. left retrograde pyelography demonstrated a narrowing at the proximal ureter just distal to the UPJ that was approximately 1 cm in length. #2  The patient had severe penile and scrotal edema making it extremely difficult to navigate the prostatic urethra.  The result was a posterior false passage with the cystoscope.  As such, Foley catheter was placed over the wire.  EBL: Minimal  Specimens: None  Indication: Victor Hayden is a 46 y.o. patient with disseminated Kaposi's sarcoma with diffuse anasarca and renal insufficiency.  CT scan demonstrated left hydronephrosis down to the proximal ureter with no obstructing stone or intraluminal obstruction.  The patient is anxious to undergo additional chemotherapy and as such I was consulted for management of his hydronephrosis.  After reviewing the management options for treatment, he elected to proceed with the above surgical procedure(s). We have discussed the potential benefits and risks of the procedure, side effects of the proposed treatment, the likelihood of the patient achieving the goals of the procedure, and any potential problems that might occur during the procedure or recuperation. Informed consent has been obtained.  Description of procedure:  The patient was taken to the operating room and general anesthesia was induced.  The patient was placed in the dorsal lithotomy position, prepped and draped in the usual sterile fashion, and preoperative antibiotics were administered. A preoperative time-out was performed.   Cystourethroscopy was performed.  The patient's urethra was examined and  was normal.  The prostatic urethra was difficult to navigate as I was unable to easily get up over the bladder neck because of his scrotal edema.  With gentle pressure I created a posterior false passage.  However, I was ultimately able to get into the true lumen and over the bladder neck.  I was unable to form a thorough cystoscopic evaluation due to immobility of the scope.  However I was able to easily cannulate the patient's left ureter.    Attention then turned to the leftureteral orifice and a ureteral catheter was used to intubate the ureteral orifice.  Omnipaque contrast was injected through the ureteral catheter and a retrograde pyelogram was performed with findings as dictated above.  A 0.38 sensor guidewire was then advanced up the left ureter into the renal pelvis under fluoroscopic guidance.  The wire was then backloaded through the cystoscope and a ureteral stent was advance over the wire using Seldinger technique.  The stent was positioned appropriately under fluoroscopic and cystoscopic guidance.  The wire was then removed with an adequate stent curl noted in the renal pelvis as well as in the bladder.  I then removed the scope and attempted to pass a 16 French Foley catheter unsuccessfully.  I then attempted an 62 [place] coud-tipped catheter using a stylette unsuccessfully.  I subsequently used the cystoscope to gently go through the patient's urethra again and ultimately was able to get to the patient's bladder noting a traumatic false passage in the posterior prostatic urethra.  I advanced a 0.038 sensor wire into the patient's bladder and removed the scope over the wire.  I then passed an [place] tip catheter over the wire and into the bladder.  Clear urine  was obtained.  10 cc of sterile water was instilled in the patient's balloon.  A B and O suppository was inserted into the patient's rectum and lidocaine jelly was injected in the patient's urethra prior to the catheter  placement.  The bladder was then emptied and the procedure ended.  The patient appeared to tolerate the procedure well and without complications.  The patient was able to be awakened and transferred to the recovery unit in satisfactory condition.    Ardis Hughs, M.D.

## 2017-04-04 NOTE — Anesthesia Postprocedure Evaluation (Signed)
Anesthesia Post Note  Patient: Victor Hayden  Procedure(s) Performed: CYSTOSCOPY WITH RETROGRADE PYELOGRAM/URETERAL STENT PLACEMENT (Left )     Patient location during evaluation: PACU Anesthesia Type: MAC Level of consciousness: awake and alert Pain management: pain level controlled Vital Signs Assessment: post-procedure vital signs reviewed and stable Respiratory status: spontaneous breathing, nonlabored ventilation, respiratory function stable and patient connected to nasal cannula oxygen Cardiovascular status: stable and blood pressure returned to baseline Postop Assessment: no apparent nausea or vomiting Anesthetic complications: no    Last Vitals:  Vitals:   04/04/17 0841 04/04/17 0859  BP: 95/61 106/65  Pulse: (!) 103 (!) 106  Resp: (!) 24 20  Temp: 36.8 C 36.6 C  SpO2: 97% 98%    Last Pain:  Vitals:   04/04/17 0841  TempSrc:   PainSc: 4                  Tiajuana Amass

## 2017-04-04 NOTE — Transfer of Care (Signed)
Immediate Anesthesia Transfer of Care Note  Patient: Victor Hayden  Procedure(s) Performed: CYSTOSCOPY WITH RETROGRADE PYELOGRAM/URETERAL STENT PLACEMENT (Left )  Patient Location: PACU  Anesthesia Type:MAC  Level of Consciousness: awake, alert  and oriented  Airway & Oxygen Therapy: Patient Spontanous Breathing and Patient connected to face mask oxygen  Post-op Assessment: Report given to RN and Post -op Vital signs reviewed and stable  Post vital signs: Reviewed and stable  Last Vitals:  Vitals:   04/03/17 2151 04/04/17 0508  BP: (!) 116/56 (!) 108/59  Pulse: (!) 120 (!) 116  Resp: 17 16  Temp: 37.8 C 36.9 C  SpO2: 97% 95%    Last Pain:  Vitals:   04/04/17 0508  TempSrc: Oral  PainSc:       Patients Stated Pain Goal: 1 (38/75/64 3329)  Complications: No apparent anesthesia complications

## 2017-04-04 NOTE — Interval H&P Note (Signed)
History and Physical Interval Note:  04/04/2017 6:14 AM  Victor Hayden  has presented today for surgery, with the diagnosis of left hydronephrosis  The various methods of treatment have been discussed with the patient and family. After consideration of risks, benefits and other options for treatment, the patient has consented to  Procedure(s): CYSTOSCOPY WITH RETROGRADE PYELOGRAM/URETERAL STENT PLACEMENT (Left) as a surgical intervention .  The patient's history has been reviewed, patient examined, no change in status, stable for surgery.  I have reviewed the patient's chart and labs.  Questions were answered to the patient's satisfaction.     Louis Meckel W

## 2017-04-04 NOTE — Consult Note (Signed)
Consultation Note Date: 04/04/2017   Patient Name: Victor Hayden  DOB: [birth year]  MRN: [id number]  Age / Sex: 46 y.o., male  PCP: Victor Hayden Referring Physician: Rosita Fire, MD  Reason for Consultation: Establishing goals of care  HPI/Patient Profile: 46 y.o. 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  Since admission he has had left thoracentesis with 1100 cc removed.  Cytology pending.  He also had ureteral stent placement this a.m. for hydronephrosis.  Palliative consulted for goals of care  Clinical Assessment and Goals of Care: I met today with Victor Hayden and his mother.  The patient remains sleepy and his mother reports this is been since he had procedure earlier today.  He does intermittently awake and will answer 1 or 2 word questions but immediately falls back to sleep.  Other than this the only time he is awake is when he is moaning in pain which occurred toward the end of our encounter.  His mother reports that his dog and helping others has been the most important things to him.  She relayed a story about him befriending an elderly male in his home town and working to help her restore her home after her husband died.  She reports that the physicians have been doing a good job explaining things to her, and she reports that Victor Hayden was in earlier today and discussed with him that he is not a candidate for further Doxil but had another agent in mind that would allow him to continue with chemotherapy.  I discussed with his mother that the goal of any medical care is to add time in quality to his life and this may be it with further chemotherapy at this  time.  I also attempted to initiate discussion that there may come a time where further chemotherapy would not add to his goal of living better and longer and at this time that does not mean there is not care like to give but the care needs to be focused on aggressive symptom management.  This time his mother is focused on him improving from current hospitalization and beginning new regimen of Taxol with Victor Hayden.  SUMMARY OF RECOMMENDATIONS   -Patient is sleepy today and not really able to participate in goals of care conversation.  I will follow-up again with him tomorrow to see if he is more alert to participate in this. -In talking with his mother today, the goal is clear to continue with any disease modifying therapy that he may be a candidate for.  We did begin to discuss that the goal of therapy is to add time and quality to his life and there may come a point in time or further chemotherapy does not accomplish this goal. -He began moaning at the end of the encounter and reported lower abdomen/pelvic pain.  Has not received rescue medication for several hours.  I called and ask his nurse to provide him with a dose of rescue medication.  We will continue to monitor his overall pain management needs and adjust medications as appropriate.  His creatinine is slightly elevated today and will keep an eye on this as he would likely be best served by rotation off of the MS Contin if his kidney function continues to worsen.  As he had a stent placed today, I anticipate his kidney function improving rather than worsening.  Continue to monitor with this in mind.  Code Status/Advance Care Planning:  DNR  Palliative Prophylaxis:   Delirium Protocol and Frequent Pain Assessment  Psycho-social/Spiritual:   Desire for further Chaplaincy support:no  Additional Recommendations: Caregiving  Support/Resources  Prognosis:   Unable to determine  Discharge Planning: To Be Determined      Primary  Diagnoses: Present on Admission: . Kaposi's sarcoma of multiple organs ([place])   I have reviewed the medical record, interviewed the patient and family, and examined the patient. The following aspects are pertinent.  Past Medical History:  Diagnosis Date  . kaposi sarcoma dx'd 08/2016   Social History   Socioeconomic History  . Marital status: Single    Spouse name: None  . Number of children: None  . Years of education: None  . Highest education level: None  Social Needs  . Financial resource strain: None  . Food insecurity - worry: None  . Food insecurity - inability: None  . Transportation needs - medical: None  . Transportation needs - non-medical: None  Occupational History  . None  Tobacco Use  . Smoking status: Former Smoker    Packs/day: 1.00    Years: 29.00    Pack years: 29.00    Types: Cigarettes    Start date: 1988    Last attempt to quit: 01/18/2017    Years since quitting: 0.2  . Smokeless tobacco: Never Used  Substance and Sexual Activity  . Alcohol use: No  . Drug use: No  . Sexual activity: No  Other Topics Concern  . None  Social History Narrative  . None   History reviewed. No pertinent family history. Scheduled Meds: . budesonide (PULMICORT) nebulizer solution  0.25 mg Nebulization BID  . docusate sodium  100 mg Oral BID  . fentaNYL      . furosemide  40 mg Intravenous Q12H  . morphine  15 mg Oral Q12H  . pantoprazole  40 mg Oral Daily  . polyethylene glycol  17 g Oral Daily  . senna-docusate  1 tablet Oral BID  . sucralfate  1 g Oral TID WC & HS   Continuous Infusions: . albumin human     PRN Meds:.albuterol, benzonatate, HYDROmorphone (DILAUDID) injection, iopamidol, magic mouthwash w/lidocaine, prochlorperazine Medications Prior to Admission:  Prior to Admission medications   Medication Sig Start Date End Date Taking? Authorizing Provider  albuterol (PROVENTIL HFA;VENTOLIN HFA) 108 (90 Base) MCG/ACT inhaler Inhale 2 puffs every 6  (six) hours as needed into the lungs for wheezing or shortness of breath. 03/15/17  Yes Victor Hayden, Victor Miller, PA-C  beclomethasone (QVAR) 40 MCG/ACT inhaler Inhale 2 puffs into the lungs 2 (two) times daily. 03/01/17  Yes Victor Hayden, Victor Miller, PA-C  benzonatate (TESSALON) 100 MG capsule Take 1 capsule (100 mg total) by mouth 3 (three) times daily as needed for cough. 03/01/17  Yes Victor Hayden, Victor Miller, PA-C  esomeprazole (NEXIUM) 20 MG capsule Take 20 mg daily at 12 noon by mouth.  Yes [provider]  furosemide (LASIX) 20 MG tablet Take 1 tablet (20 mg total) daily by mouth. 03/15/17  Yes Victor Hayden, Victor Miller, PA-C  magic mouthwash w/lidocaine SOLN Take 5 mLs by mouth 4 (four) times daily as needed for mouth pain. 11/17/16  Yes Wyatt Portela, MD  nystatin cream (MYCOSTATIN) Apply 1 application topically 2 (two) times daily. 03/01/17  Yes Victor Hayden, Victor Miller, PA-C  prochlorperazine (COMPAZINE) 10 MG tablet Take 1 tablet (10 mg total) by mouth every 6 (six) hours as needed for nausea or vomiting. 09/19/16  Yes Shadad, Mathis Dad, MD  sucralfate (CARAFATE) 1 GM/10ML suspension Take 10 mLs (1 g total) by mouth 4 (four) times daily -  with meals and at bedtime. 03/23/17  Yes Wyatt Portela, MD   Allergies  Allergen Reactions  . Bee Venom Anaphylaxis   Review of Systems Unable to obtain: patient sleepy today  Physical Exam General: Sleepy.  Arouses but immediately falls back asleep.  In no acute distress for most of encounter, but wakes and is moaning in end of the encounter.  HEENT: No bruits, no goiter, no JVD Heart: Regular rate and rhythm. No murmur appreciated. Lungs: Good air movement, clear Abdomen: Soft, nontender, nondistended, positive bowel sounds.  Ext: significant edema Skin: Warm and dry  Vital Signs: BP (!) 110/58 (BP Location: Left Arm)   Pulse (!) 112   Temp 98.4 F (36.9 C) (Oral)   Resp 20   Ht _0  (1.778 m)   Wt 93 kg (205 lb)   SpO2 94%   BMI 29.41 kg/m  Pain Assessment:  0-10 POSS *See Group Information*: 1-Acceptable,Awake and alert Pain Score: 8    SpO2: SpO2: 94 % O2 Device:SpO2: 94 % O2 Flow Rate: .O2 Flow Rate (L/min): 2 L/min  IO: Intake/output summary:   Intake/Output Summary (Last 24 hours) at 04/04/2017 1614 Last data filed at 04/04/2017 0841 Gross per 24 hour  Intake 400 ml  Output 250 ml  Net 150 ml    LBM: Last BM Date: 04/03/17 Baseline Weight: Weight: 93 kg (205 lb) Most recent weight: Weight: 93 kg (205 lb)     Palliative Assessment/Data:   Flowsheet Rows     Most Recent Value  Intake Tab  Referral Department  Hospitalist  Unit at Time of Referral  Oncology Unit  Palliative Care Primary Diagnosis  Neurology  Date Notified  04/03/17  Palliative Care Type  New Palliative care  Reason for referral  Clarify Goals of Care  Date of Admission  03/30/17  Date first seen by Palliative Care  04/04/17  # of days Palliative referral response time  1 Day(s)  # of days IP prior to Palliative referral  4  Clinical Assessment  Palliative Performance Scale Score  50%  Pain Max last 24 hours  Not able to report  Pain Min Last 24 hours  Not able to report  Psychosocial & Spiritual Assessment  Palliative Care Outcomes  Patient/Family meeting held?  Yes  Who was at the meeting?  Patient, mother      Time In: 57   Time Out: 1530 Time Total: 77 Greater than 50%  of this time was spent counseling and coordinating care related to the above assessment and plan.  Signed by: Micheline Rough, MD   Please contact Palliative Medicine Team phone at [phone] for questions and concerns.  For individual provider: See Shea Evans

## 2017-04-04 NOTE — Progress Notes (Signed)
PT Cancellation Note  Patient Details Name: Victor Hayden MRN: [id number] DOB: [birth year]   Cancelled Treatment:    Reason Eval/Treat Not Completed: Patient at procedure or test/unavailable   [name] 04/04/2017, 7:00 AM [name] PT [phone]

## 2017-04-04 NOTE — Progress Notes (Signed)
PULMONARY / CRITICAL CARE MEDICINE   Name: Victor Hayden MRN: [id number] DOB: 22-Nov-1970    ADMISSION DATE:  03/30/2017 CONSULTATION DATE: 03/31/2017  REFERRING PROVIDER: Dr. 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  CT angio chest concerning for pulmonary embolism.  SUBJECTIVE:  Chest pain better, cough diminished. VITAL SIGNS: BP (!) 110/58 (BP Location: Left Arm)   Pulse (!) 112   Temp 98.4 F (36.9 C) (Oral)   Resp 20   Ht 5\' 10"  (1.778 m)   Wt 205 lb (93 kg)   SpO2 94%   BMI 29.41 kg/m   INTAKE / OUTPUT: I/O last 3 completed shifts: In: 0  Out: 200 [Urine:200]  PHYSICAL EXAMINATION:  General: 46 year old male patient now status post surgery.  Arouses, awake, oriented but dozes back off quite briskly. HEENT normocephalic atraumatic mucous membranes are moist. Pulmonary: Clear to auscultation, no accessory muscle use. Cardiac: Regular rate and rhythm without murmur rub or gallop. Abdomen: Positive bowel sounds, tolerating diet.  No pain. Extremities: Diffuse anasarca specifically from pelvis down, chronic venous stasis changes. Neuro: Awake, oriented, no focal deficits   LABS:  BMET Recent Labs  Lab 04/02/17 0352 04/03/17 0356 04/04/17 0405  NA 128* 128* 127*  K 3.5 3.8 4.1  CL 98* 98* 97*  CO2 23 25 24   BUN 28* 26* 27*  CREATININE 1.43* 1.49* 1.51*  GLUCOSE 120* 106* 126*    Electrolytes Recent Labs  Lab 04/01/17 0153 04/02/17 0352 04/03/17 0356 04/04/17 0405  CALCIUM 7.5* 7.3* 7.4* 7.5*  MG 1.8  --   --   --     CBC Recent Labs  Lab 04/02/17 0352 04/03/17 0356 04/04/17 0405  WBC 4.9 3.7* 5.0  HGB 7.2* 7.3* 7.5*  HCT 21.6* 22.1* 22.6*  PLT 79* 65* 71*    Coag's Recent Labs  Lab 03/30/17 2014  APTT 28  INR 1.08    Sepsis Markers No results for input(s): LATICACIDVEN,  PROCALCITON, O2SATVEN in the last 168 hours.  ABG No results for input(s): PHART, PCO2ART, PO2ART in the last 168 hours.  Liver Enzymes Recent Labs  Lab 03/30/17 2014  AST 34  ALT 12*  ALKPHOS 56  BILITOT 0.9  ALBUMIN 2.1*    Cardiac Enzymes Recent Labs  Lab 03/31/17 0049  TROPONINI <0.03    Glucose No results for input(s): GLUCAP in the last 168 hours.  Imaging Dg Chest Port 1 View  Result Date: 04/03/2017 CLINICAL DATA:  Status post left-sided thoracentesis with removal of 1 L fluid. History of cap Celsius sarcoma. EXAM: PORTABLE CHEST 1 VIEW COMPARISON:  PA and lateral chest x-ray of April 01, 2017 FINDINGS: No postprocedure pneumothorax is observed on the left. There is atelectatic change at the left base. The volume of pleural fluid is radiographically less today. On the right basilar atelectasis or infiltrate persists. The heart is mildly enlarged. The pulmonary vascularity is not engorged. IMPRESSION: No postprocedure pneumothorax following left-sided thoracentesis. Electronically Signed   By: David  Martinique M.D.   On: 04/03/2017 12:04   Dg C-arm 1-60 Min-no Report  Result Date: 04/04/2017 Fluoroscopy was utilized by the requesting physician.  No radiographic interpretation.     STUDIES:  CT angio chest 12/01 >> poor study, possible PE b/l, b/l nodular infiltrates, Lt > Rt pleural effusion Doppler legs b/l 12/02 >> no DVT Echo 12/02 >> EF  55 to 60%, normal RV function and PA pressure VQ scan 12/3: normal perfusion/ decreased Lower lobe ventilation Site: left Date: 12/4  Pleural fluid Serum   LDH: 1137 LDH: 164  Protein: Less than 3 Protein: 4.9  Cholesterol:  Cholesterol: 60  Afb:   Gm stain:    Culture: Abundant WBCs>>>   Fungal:    Hct:    Color: Yellow   Wbc: 2660   Neutrophils: 1   Lymph: 2   Ph:    Rheumatoid factor:   Adenosine Deaminase:   Glucose:    Cytology:       Special notes:      DISCUSSION: 46 yo male with pleuritic type chest  pain.  CT angio chest was suggestive of PE, but was poor study.  Echo unremarkable and doppler legs b/l negative.  Still has increased risk of thromboembolic disease in setting of Kaposi's sarcoma.  Underwent thoracentesis on 12/4 drained 1100 cc of concentrated orange fluid.  Preliminary findings consistent with exudate concerning for malignancy   ASSESSMENT / PLAN:  Pleuritic chest pain w/ normal perfusion on v/q scan and Left exudative Pleural effusion. PCXR personally reviewed: Bibasilar atelectasis, decreased right effusion -I am worried this could represent further metastasis -He feels better following thoracentesis Plan Follow-up cytology and cultures  Kaposi's sarcoma. 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  I do not think that this is pulmonary source Per internal medicine  Erick Colace ACNP-BC [place] Pager # [phone] OR # [phone] if no answer

## 2017-04-04 NOTE — Progress Notes (Signed)
Bladder scan total = 0

## 2017-04-04 NOTE — Progress Notes (Signed)
A left ureteral stent was placed this morning.  The patient was noted to have a 1 cm proximal ureteral stricture or stenosis with associated proximal hydronephrosis.  There was no filling defect.  During the case  false passage was created in the patient's prostatic urethra because of difficulty passing the scope related to his severe scrotal edema.  As such, I have placed a Foley catheter which should remain for at least 3 days.

## 2017-04-04 NOTE — Progress Notes (Signed)
Pt to pre op per bed

## 2017-04-04 NOTE — Anesthesia Preprocedure Evaluation (Addendum)
Anesthesia Evaluation  Patient identified by MRN, date of birth, ID band Patient awake    Reviewed: Allergy & Precautions, H&P , Patient's Chart, lab work & pertinent test results, reviewed documented beta blocker date and time   Airway Mallampati: II  TM Distance: >3 FB Neck ROM: full    Dental no notable dental hx.    Pulmonary former smoker,    Pulmonary exam normal breath sounds clear to auscultation       Cardiovascular  Rhythm:regular Rate:Normal     Neuro/Psych    GI/Hepatic   Endo/Other    Renal/GU      Musculoskeletal   Abdominal   Peds  Hematology  (+) anemia , neg HIV,   Anesthesia Other Findings Pleural effusions pleuritis Low Plts Anemia hyponatremia Anasarca   Reproductive/Obstetrics                            Anesthesia Physical Anesthesia Plan  ASA: II  Anesthesia Plan: MAC   Post-op Pain Management:    Induction: Intravenous  PONV Risk Score and Plan:   Airway Management Planned: Mask  Additional Equipment:   Intra-op Plan:   Post-operative Plan:   Informed Consent: I have reviewed the patients History and Physical, chart, labs and discussed the procedure including the risks, benefits and alternatives for the proposed anesthesia with the patient or authorized representative who has indicated his/her understanding and acceptance.   Dental Advisory Given  Plan Discussed with: CRNA and Surgeon  Anesthesia Plan Comments: (  May switch to [place]; Dr Lemmie Evens fine with MAC plan to start)       Anesthesia Quick Evaluation

## 2017-04-04 NOTE — Progress Notes (Signed)
PROGRESS NOTE    Victor Hayden  TOI:[id number] DOB: 01/31/1971 DOA: 03/30/2017 PCP: Mackie Pai, PA-C   Brief Narrative: 46 year old male with history of rapidly worsening widespread classic Kaposi's sarcoma presenting with worsening chest pain, shortness of breath, anasarca.  CT angiogram negative for PE.  Assessment & Plan:  #Dyspnea associated with chest pain: CT angiogram, VQ scan low probability for PE.  Echocardiogram with EF within normal limit.  PE ruled out. -Concern for Kaposi's sarcoma involving the long, pulmonary consult appreciated.  Status post left thoracocentesis on 12/4 with removal of 1100 cc of fluid, follow-up serial studies.  #Bilateral lower extremities edema/scrotal edema: Patient with very low albumin.  Reportedly IV Lasix was discontinued because of worsening renal failure.  On physical examination patient is grossly fluid overload therefore I am restarting IV Lasix in addition to IV albumin.  Unable to order IV torsemide or IV Bumex because of shortage.  #Acute kidney injury likely due to obstruction,? contrast associated : Status post stent placement.  Ordered IV Lasix for edema.  Monitor BMP.  Avoid nephrotoxins.  Patient has hyponatremia check urine electrolytes.  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  Oncology follow-up ongoing.  May consider chemo after discharge.  Palliative care evaluation ongoing.  Continue supportive care and pain management.  #Cancer pain: Continue current pain management.  Patient looks comfortable. -Patient is on MS Contin 15 mg twice a day and Dilaudid as needed with stool softener.  #Left hydroureteronephrosis: Status post a stent placement by urology today.  #Thrombocytopenia in the setting of chemotherapy and malignancy: Monitor platelet count.  No sign of bleeding.  #Anemia in the setting of chemo and malignancy: Is status post blood transfusion.  Monitor CBC.  #Failure to thrive and generalized weakness with  overall poor prognosis.  Palliative care evaluation ongoing.  Patient is DNR.  DVT prophylaxis:  SCD Code Status: DNR Family Communication: Discussed with the patient's mother Disposition Plan: Inpatient    Consultants:   Oncology  Palliative care  Procedures: Ureteral stent placement, thoracocentesis Antimicrobials: None  Subjective: Seen and examined at bedside.  Patient reported fatigue and leg edema.  Denies chest pain, shortness of breath, nausea vomiting.  Objective: Vitals:   04/04/17 0830 04/04/17 0841 04/04/17 0859 04/04/17 1000  BP: 95/61 95/61 106/65 (!) 110/58  Pulse: (!) 106 (!) 103 (!) 106 (!) 112  Resp: 18 (!) 24 20   Temp:  98.2 F (36.8 C) 97.9 F (36.6 C) 98.4 F (36.9 C)  TempSrc:    Oral  SpO2: 96% 97% 98% 94%  Weight:      Height:        Intake/Output Summary (Last 24 hours) at 04/04/2017 1505 Last data filed at 04/04/2017 0841 Gross per 24 hour  Intake 400 ml  Output 250 ml  Net 150 ml   Filed Weights   03/30/17 1925  Weight: 93 kg (205 lb)    Examination:  General exam: Appears calm and comfortable  Respiratory system: Clear to auscultation. Respiratory effort normal. No wheezing or crackle Cardiovascular system: S1 & S2 heard, RRR.  Bilateral lower extremities pitting edema. Gastrointestinal system: Abdomen is nondistended, soft and nontender. Normal bowel sounds heard. Central nervous system: Alert and oriented. No focal neurological deficits. Extremities: Symmetric 5 x 5 power. Skin: No rashes, lesions or ulcers Psychiatry: Judgement and insight appear normal. Mood & affect appropriate.     Data Reviewed: I have personally reviewed following labs and imaging studies  CBC: Recent Labs  Lab 03/30/17 2014  03/31/17 0824 04/01/17 0153 04/02/17 0352 04/03/17 0356 04/04/17 0405  WBC 4.9 4.7 5.6 4.9 3.7* 5.0  NEUTROABS 3.3  --   --   --   --  3.0  HGB 9.2* 8.8* 8.5* 7.2* 7.3* 7.5*  HCT 28.8* 26.9* 25.8* 21.6* 22.1* 22.6*    MCV 84.0 84.1 83.5 83.4 83.4 83.7  PLT 107* 91* 94* 79* 65* 71*   Basic Metabolic Panel: Recent Labs  Lab 03/30/17 2014 04/01/17 0153 04/02/17 0352 04/03/17 0356 04/04/17 0405  NA 131* 129* 128* 128* 127*  K 3.5 3.9 3.5 3.8 4.1  CL 100* 98* 98* 98* 97*  CO2 24 23 23 25 24   GLUCOSE 138* 131* 120* 106* 126*  BUN 17 22* 28* 26* 27*  CREATININE 1.02 1.09 1.43* 1.49* 1.51*  CALCIUM 7.6* 7.5* 7.3* 7.4* 7.5*  MG  --  1.8  --   --   --    GFR: Estimated Creatinine Clearance: 68.5 mL/min (A) (by C-G formula based on SCr of 1.51 mg/dL (H)). Liver Function Tests: Recent Labs  Lab 03/30/17 2014 04/03/17 1143  AST 34  --   ALT 12*  --   ALKPHOS 56  --   BILITOT 0.9  --   PROT 5.5* 4.9*  ALBUMIN 2.1*  --    No results for input(s): LIPASE, AMYLASE in the last 168 hours. No results for input(s): AMMONIA in the last 168 hours. Coagulation Profile: Recent Labs  Lab 03/30/17 2014  INR 1.08   Cardiac Enzymes: Recent Labs  Lab 03/31/17 0049  TROPONINI <0.03   BNP (last 3 results) Recent Labs    03/01/17 1036  PROBNP 10.0   HbA1C: No results for input(s): HGBA1C in the last 72 hours. CBG: No results for input(s): GLUCAP in the last 168 hours. Lipid Profile: Recent Labs    04/03/17 1143  CHOL 60   Thyroid Function Tests: No results for input(s): TSH, T4TOTAL, FREET4, T3FREE, THYROIDAB in the last 72 hours. Anemia Panel: No results for input(s): VITAMINB12, FOLATE, FERRITIN, TIBC, IRON, RETICCTPCT in the last 72 hours. Sepsis Labs: No results for input(s): PROCALCITON, LATICACIDVEN in the last 168 hours.  Recent Results (from the past 240 hour(s))  Urine Culture     Status: None   Collection Time: 03/30/17 11:33 PM  Result Value Ref Range Status   Specimen Description URINE, CLEAN CATCH  Final   Special Requests Normal  Final   Culture   Final    NO GROWTH Performed at [facility], [address] [place], [place] [address]    Report Status  04/01/2017 FINAL  Final  Body fluid culture     Status: None (Preliminary result)   Collection Time: 04/03/17 12:06 PM  Result Value Ref Range Status   Specimen Description PLEURAL  Final   Special Requests NONE  Final   Gram Stain   Final    ABUNDANT WBC PRESENT, PREDOMINANTLY MONONUCLEAR NO ORGANISMS SEEN    Culture   Final    NO GROWTH < 24 HOURS Performed at [facility], [place] [address] [place], [place] [address]    Report Status PENDING  Incomplete         Radiology Studies: US Scrotum  Result Date: 04/02/2017 CLINICAL DATA:  Scrotal edema.  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 EXAM: SCROTAL ULTRASOUND DOPPLER ULTRASOUND OF THE TESTICLES TECHNIQUE: Complete ultrasound examination of the testicles, epididymis, and other scrotal structures was performed. Color and spectral Doppler ultrasound were also utilized  to evaluate blood flow to the testicles. COMPARISON:  None. FINDINGS: Right testicle Measurements: 4.3 x 2.1 x 2.5 cm. 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 Left testicle Measurements: 3.9 x 2 x 2.3 cm. 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 Right epididymis: Normal in size and appearance to the extent visualized. The right epididymal head was not optimally visualized. Left epididymis:  Normal in size and appearance. Hydrocele:  None visualized. Varicocele:  None visualized. Pulsed Doppler interrogation of both testes demonstrates normal low resistance arterial and venous waveforms bilaterally. Diffuse scrotal edema, nonspecific in etiology. IMPRESSION: 1. 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 Current literature suggests that testicular microlithiasis is not a significant independent risk factor for development of testicular carcinoma, and that follow up imaging is not warranted in the absence of other risk factors. Monthly testicular self-examination and annual physical exams are considered appropriate surveillance. If  patient has other risk factors for testicular carcinoma, then referral to Urology should be considered. (Reference: DeCastro, et al.: A 5-Year Follow up Study of Asymptomatic Men with Testicular Microlithiasis. J Urol 2008; 086:5784-6962.) 2. No torsion of either testicle. 3. Nonspecific scrotal edema. Electronically Signed   By: Ashley Royalty M.D.   On: 04/02/2017 19:51   Ct Abdomen Pelvis W Contrast  Result Date: 04/02/2017 CLINICAL DATA:  Scrotal swelling with generalized body aches and swelling. History of Kaposi's sarcoma. EXAM: CT ABDOMEN AND PELVIS WITH CONTRAST TECHNIQUE: Multidetector CT imaging of the abdomen and pelvis was performed using the standard protocol following bolus administration of intravenous contrast. CONTRAST:  137mL ISOVUE-300 IOPAMIDOL (ISOVUE-300) INJECTION 61% COMPARISON:  CT chest 03/30/2017.  Abdomen and pelvis CT 09/27/2016. FINDINGS: Lower chest: Heart size normal. Small to moderate left and small right pleural effusions. Central peribronchovascular airspace disease with bilateral pleural nodularity. Hepatobiliary: No focal abnormality within the liver parenchyma. Gallbladder is distended but otherwise unremarkable. No intrahepatic or extrahepatic biliary dilation. Pancreas: No focal mass lesion. No dilatation of the main duct. No intraparenchymal cyst. No peripancreatic edema. Spleen: No splenomegaly. No focal mass lesion. Adrenals/Urinary Tract: No adrenal nodule or mass. Right kidney and ureter unremarkable. Mild to moderate left hydroureteronephrosis identified with decreased perfusion to the left kidney compatible with obstructive uropathy. Left ureter is distended to the level of the aortic bifurcation with no hydroureter distal to that location. Bladder is decompressed. Stomach/Bowel: Stomach is nondistended. No gastric wall thickening. No evidence of outlet obstruction. Duodenum is normally positioned as is the ligament of Treitz. No small bowel wall thickening. No small  bowel dilatation. The terminal ileum is normal. The appendix is normal. No gross colonic mass. No colonic wall thickening. No substantial diverticular change. Vascular/Lymphatic: No abdominal aortic aneurysm. No abdominal aortic atherosclerotic calcification. Portal vein and superior mesenteric vein are patent. Celiac axis and SMA are patent. There is no gastrohepatic or hepatoduodenal ligament lymphadenopathy. No intraperitoneal or retroperitoneal lymphadenopathy. Edema/ inflammation is seen in the retroperitoneal soft tissues extending from the celiac axis down into the extraperitoneal space along both pelvic sidewalls. The mild left para-aortic lymphadenopathy seen on the 09/27/2016 exam has been incorporated into this change today and is less evident. Reproductive: The prostate gland and seminal vesicles have normal imaging features. Other: No substantial intraperitoneal free fluid. Musculoskeletal: Fairly marked diffuse body wall edema is seen in the lower chest, abdomen, and pelvis. Skin thickening is associated over the pelvic regions. The edema tracks into the intramuscular spaces of the pelvis and proximal thighs. IMPRESSION: 1. Fairly marked diffuse body wall edema with fluid/edema tracking into  the musculature of the upper thighs and pelvis. Associated skin thickening is noted over the pelvis. This is associated with retroperitoneal edema involving the abdomen and extraperitoneal pelvic sidewalls. 2. Mild to moderate left hydroureteronephrosis with decreased perfusion of the left kidney compatible with obstructive uropathy. The left ureter appears obstructed in its mid segment as it courses through the edematous retroperitoneal tissues, but no obstructing lesion is evident by CT. 3. Small to moderate bilateral pleural effusions with bilateral dependent lower lobe collapse/consolidation. Electronically Signed   By: Misty Stanley M.D.   On: 04/02/2017 19:23   Dg Chest Port 1 View  Result Date:  04/03/2017 CLINICAL DATA:  Status post left-sided thoracentesis with removal of 1 L fluid. History of cap Celsius sarcoma. EXAM: PORTABLE CHEST 1 VIEW COMPARISON:  PA and lateral chest x-ray of April 01, 2017 FINDINGS: No postprocedure pneumothorax is observed on the left. There is atelectatic change at the left base. The volume of pleural fluid is radiographically less today. On the right basilar atelectasis or infiltrate persists. The heart is mildly enlarged. The pulmonary vascularity is not engorged. IMPRESSION: No postprocedure pneumothorax following left-sided thoracentesis. Electronically Signed   By: David  Martinique M.D.   On: 04/03/2017 12:04   Dg C-arm 1-60 Min-no Report  Result Date: 04/04/2017 Fluoroscopy was utilized by the requesting physician.  No radiographic interpretation.   [place] Scrotom Doppler  Result Date: 04/02/2017 CLINICAL DATA:  Scrotal edema.  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 EXAM: SCROTAL ULTRASOUND DOPPLER ULTRASOUND OF THE TESTICLES TECHNIQUE: Complete ultrasound examination of the testicles, epididymis, and other scrotal structures was performed. Color and spectral Doppler ultrasound were also utilized to evaluate blood flow to the testicles. COMPARISON:  None. FINDINGS: Right testicle Measurements: 4.3 x 2.1 x 2.5 cm. 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 Left testicle Measurements: 3.9 x 2 x 2.3 cm. 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 Right epididymis: Normal in size and appearance to the extent visualized. The right epididymal head was not optimally visualized. Left epididymis:  Normal in size and appearance. Hydrocele:  None visualized. Varicocele:  None visualized. Pulsed Doppler interrogation of both testes demonstrates normal low resistance arterial and venous waveforms bilaterally. Diffuse scrotal edema, nonspecific in etiology. IMPRESSION: 1. 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 Current literature suggests that  testicular microlithiasis is not a significant independent risk factor for development of testicular carcinoma, and that follow up imaging is not warranted in the absence of other risk factors. Monthly testicular self-examination and annual physical exams are considered appropriate surveillance. If patient has other risk factors for testicular carcinoma, then referral to Urology should be considered. (Reference: DeCastro, et al.: A 5-Year Follow up Study of Asymptomatic Men with Testicular Microlithiasis. J Urol 2008; 161:0960-4540.) 2. No torsion of either testicle. 3. Nonspecific scrotal edema. Electronically Signed   By: Ashley Royalty M.D.   On: 04/02/2017 19:51        Scheduled Meds: . budesonide (PULMICORT) nebulizer solution  0.25 mg Nebulization BID  . docusate sodium  100 mg Oral BID  . fentaNYL      . morphine  15 mg Oral Q12H  . pantoprazole  40 mg Oral Daily  . polyethylene glycol  17 g Oral Daily  . senna-docusate  1 tablet Oral BID  . sucralfate  1 g Oral TID WC & HS   Continuous Infusions:   LOS: 5 days    Dron Tanna Furry, MD [facility] Pager [phone]  If 7PM-7AM, please contact night-coverage [email or web address] Password [facility] 04/04/2017,  3:05 PM

## 2017-04-04 NOTE — Progress Notes (Signed)
IP PROGRESS NOTE  Subjective:   Events noted in the last 24 hours.  He underwent a cystoscopy and a stent placement.  He tolerated procedure well without any complications.  Continues to have scrotal edema although his lower extremity edema may be slightly improved.  Objective:  Vital signs in last 24 hours: Temp:  [97.8 F (36.6 C)-100 F (37.8 C)] 98.4 F (36.9 C) (12/05 1000) Pulse Rate:  [103-120] 112 (12/05 1000) Resp:  [12-24] 20 (12/05 0859) BP: (95-116)/(56-65) 110/58 (12/05 1000) SpO2:  [92 %-100 %] 94 % (12/05 1000) Weight change:  Last BM Date: 04/03/17  Intake/Output from previous day: 12/04 0701 - 12/05 0700 In: 0  Out: 200 [Urine:200] Alert, awake without acute distress. Mouth: mucous membranes moist, pharynx normal without lesions Resp: clear to auscultation bilaterally Cardio: regular rate and rhythm, S1, S2 normal, no murmur, click, rub or gallop GI: soft, non-tender; bowel sounds normal; no masses,  no organomegaly Extremities: Edema noted bilaterally to the level of the thigh.   Lab Results: Recent Labs    04/03/17 0356 04/04/17 0405  WBC 3.7* 5.0  HGB 7.3* 7.5*  HCT 22.1* 22.6*  PLT 65* 71*    BMET Recent Labs    04/03/17 0356 04/04/17 0405  NA 128* 127*  K 3.8 4.1  CL 98* 97*  CO2 25 24  GLUCOSE 106* 126*  BUN 26* 27*  CREATININE 1.49* 1.51*  CALCIUM 7.4* 7.5*        Medications: I have reviewed the patient's current medications.  Assessment/Plan:  46 year old gentleman with the following issues:  1.  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  He presented with diffuse cutaneous manifestation as well as abdominal adenopathy.  He received Doxil between May 2018 and November 2018.  He had a reasonable response initially but now complaining of symptoms of failure to thrive progressive weakness and worsening edema.  CT scan of the abdomen and pelvis showed no specific findings to suggest disease progression although  showed generalized anasarca and body wall edema which could be related to his disease.  The plan is to continue with supportive management as you are doing and consider using Taxol chemotherapy as a salvage regimen upon discharge.   2.  Possible pulmonary embolism: VQ scan showed normal perfusion.  3.  Generalized anasarca: Suspect Kaposi's sarcoma progression although the CT scan showed decrease in his lymphadenopathy.  He is currently on Lasix with very little improvement.  I recommend continue with current management at this time.  Treating his Kaposi's sarcoma with Taxol may be an option in the future to treat his disseminated disease which might result in improvement.  4.  Hydronephrosis: Status post stent placement.  This will improve his renal function and hopefully better diuresis.  5.  Thrombocytopenia: No active bleeding noted.  This could be related to his malignancy and acute illness.  His platelet count is improving.  6.  Anemia: His iron is low at 20 with saturation of 11%.  I recommend intravenous iron treatments before discharge.  7.  We will continue to follow with you moving forward.   LOS: 5 days   Zola Button 04/04/2017, 12:33 PM

## 2017-04-05 DIAGNOSIS — N5089 Other specified disorders of the male genital organs: Secondary | ICD-10-CM

## 2017-04-05 LAB — CBC
HCT: 20.1 % — ABNORMAL LOW (ref 39.0–52.0)
Hemoglobin: 6.6 g/dL — CL (ref 13.0–17.0)
MCH: 27.5 pg (ref 26.0–34.0)
MCHC: 32.8 g/dL (ref 30.0–36.0)
MCV: 83.8 fL (ref 78.0–100.0)
PLATELETS: 68 10*3/uL — AB (ref 150–400)
RBC: 2.4 MIL/uL — AB (ref 4.22–5.81)
RDW: 17.9 % — AB (ref 11.5–15.5)
WBC: 4.7 10*3/uL (ref 4.0–10.5)

## 2017-04-05 LAB — RENAL FUNCTION PANEL
Albumin: 2.1 g/dL — ABNORMAL LOW (ref 3.5–5.0)
Anion gap: 4 — ABNORMAL LOW (ref 5–15)
BUN: 27 mg/dL — AB (ref 6–20)
CALCIUM: 7.7 mg/dL — AB (ref 8.9–10.3)
CHLORIDE: 96 mmol/L — AB (ref 101–111)
CO2: 27 mmol/L (ref 22–32)
CREATININE: 1.36 mg/dL — AB (ref 0.61–1.24)
GFR calc non Af Amer: >60 mL/min (ref >60)
GLUCOSE: 128 mg/dL — AB (ref 65–99)
Phosphorus: 4.1 mg/dL (ref 2.5–4.6)
Potassium: 4.1 mmol/L (ref 3.5–5.1)
SODIUM: 127 mmol/L — AB (ref 135–145)

## 2017-04-05 LAB — CHOLESTEROL, BODY FLUID: Cholesterol, Fluid: 27 mg/dL

## 2017-04-05 LAB — HEPARIN INDUCED PLATELET AB (HIT ANTIBODY): HEPARIN INDUCED PLT AB: 0.178 {OD_unit} (ref 0.000–0.400)

## 2017-04-05 LAB — PREPARE RBC (CROSSMATCH)

## 2017-04-05 MED ORDER — SODIUM CHLORIDE 0.9 % IV SOLN
510.0000 mg | Freq: Once | INTRAVENOUS | Status: AC
  Administered 2017-04-05: 510 mg via INTRAVENOUS
  Filled 2017-04-05: qty 17

## 2017-04-05 MED ORDER — ACETAMINOPHEN 325 MG PO TABS
650.0000 mg | ORAL_TABLET | Freq: Once | ORAL | Status: AC
  Administered 2017-04-05: 650 mg via ORAL
  Filled 2017-04-05: qty 2

## 2017-04-05 MED ORDER — SODIUM CHLORIDE 0.9 % IV SOLN
Freq: Once | INTRAVENOUS | Status: AC
  Administered 2017-04-05: 12:00:00 via INTRAVENOUS

## 2017-04-05 MED ORDER — DIPHENHYDRAMINE HCL 25 MG PO CAPS
25.0000 mg | ORAL_CAPSULE | Freq: Once | ORAL | Status: AC
  Administered 2017-04-05: 25 mg via ORAL
  Filled 2017-04-05: qty 1

## 2017-04-05 MED ORDER — DIPHENHYDRAMINE HCL 25 MG PO CAPS
25.0000 mg | ORAL_CAPSULE | Freq: Four times a day (QID) | ORAL | Status: DC | PRN
  Administered 2017-04-05: 25 mg via ORAL
  Filled 2017-04-05: qty 1

## 2017-04-05 NOTE — Progress Notes (Signed)
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  1 Day Post-Op   Intv/Subj: No acute events overnight. The patient relates no significant pain.  He is thankful for the Foley catheter as it reduces the urinary frequency he was struggling with prior tube placement. Again, the patient has recently received pain medication, and is fairly sleepy.   Active Problems:   Kaposi's sarcoma ([place])   Dyspnea   Pleuritic chest pain   Dysuria   Anemia   Thrombocytopenia (HCC)   Pulmonary embolism (HCC)   Kaposi's sarcoma of multiple organs (HCC)   Pleural effusion on left  Current Facility-Administered Medications  Medication Dose Route Frequency Provider Last Rate Last Dose  . 0.9 %  sodium chloride infusion   Intravenous Once Kirby-Graham, Karsten Fells, NP      . albumin human 25 % solution 25 g  25 g Intravenous Q6H Rosita Fire, MD   25 g at 04/05/17 0422  . albuterol (PROVENTIL) (2.5 MG/3ML) 0.083% nebulizer solution 3 mL  3 mL Inhalation Q6H PRN Wouk, Ailene Rud, MD      . benzonatate (TESSALON) capsule 100 mg  100 mg Oral TID PRN Gwynne Edinger, MD   100 mg at 04/02/17 1058  . budesonide (PULMICORT) nebulizer solution 0.25 mg  0.25 mg Nebulization BID Florencia Reasons, MD   0.25 mg at 04/04/17 1934  . diphenhydrAMINE (BENADRYL) capsule 25 mg  25 mg Oral Q6H PRN Gardiner Barefoot, NP   25 mg at 04/05/17 0036  . docusate sodium (COLACE) capsule 100 mg  100 mg Oral BID Sampson Goon, MD   100 mg at 04/04/17 2137  . furosemide (LASIX) injection 40 mg  40 mg Intravenous Q12H Rosita Fire, MD   40 mg at 04/05/17 0422  . HYDROmorphone (DILAUDID) injection 1 mg  1 mg Intravenous Q3H PRN Wouk, Ailene Rud, MD   1 mg at 04/04/17 1529  . iopamidol  (ISOVUE-300) 61 % injection 15 mL  15 mL Oral Once PRN Florencia Reasons, MD      . magic mouthwash w/lidocaine  5 mL Oral QID PRN Wouk, Ailene Rud, MD      . morphine (MS CONTIN) 12 hr tablet 15 mg  15 mg Oral Q12H Sampson Goon, MD   15 mg at 04/04/17 2137  . pantoprazole (PROTONIX) EC tablet 40 mg  40 mg Oral Daily Gwynne Edinger, MD   40 mg at 04/04/17 1005  . polyethylene glycol (MIRALAX / GLYCOLAX) packet 17 g  17 g Oral Daily Florencia Reasons, MD   17 g at 04/02/17 1044  . prochlorperazine (COMPAZINE) tablet 10 mg  10 mg Oral Q6H PRN Wouk, Ailene Rud, MD      . senna-docusate (Senokot-S) tablet 1 tablet  1 tablet Oral BID Florencia Reasons, MD   1 tablet at 04/04/17 2137  . sucralfate (CARAFATE) 1 GM/10ML suspension 1 g  1 g Oral TID WC & HS Gwynne Edinger, MD   1 g at 04/04/17 2136     Objective: Vital: Vitals:   04/04/17 1600 04/04/17 1934 04/04/17 1957 04/05/17 0502  BP: (!) 99/42  (!) 98/50 (!) 97/56  Pulse: 99  (!) 111 (!) 106  Resp:   18 18  Temp: 99.5 F (37.5 C)  99.1  F (37.3 C) 99 F (37.2 C)  TempSrc: Oral  Oral Oral  SpO2: 98% 97% 92% 95%  Weight:      Height:       I/Os: I/O last 3 completed shifts: In: 1310 [P.O.:710; I.V.:400; IV Piggyback:200] Out: 1750 [Urine:1750]  Physical Exam:  General: Patient is in no apparent distress Lungs: Normal respiratory effort, chest expands symmetrically. GI: Incisions are c/d/i. The abdomen is soft and nontender without mass. Foley: Clear yellow urine Ext: lower extremities symmetric  Lab Results: Recent Labs    04/03/17 0356 04/04/17 0405 04/05/17 0331  WBC 3.7* 5.0 4.7  HGB 7.3* 7.5* 6.6*  HCT 22.1* 22.6* 20.1*   Recent Labs    04/03/17 0356 04/04/17 0405 04/05/17 0331  NA 128* 127* 127*  K 3.8 4.1 4.1  CL 98* 97* 96*  CO2 25 24 27   GLUCOSE 106* 126* 128*  BUN 26* 27* 27*  CREATININE 1.49* 1.51* 1.36*  CALCIUM 7.4* 7.5* 7.7*   No results for input(s): LABPT, INR in the last 72 hours. No results for input(s):  LABURIN in the last 72 hours. Results for orders placed or performed during the hospital encounter of 03/30/17  Urine Culture     Status: None   Collection Time: 03/30/17 11:33 PM  Result Value Ref Range Status   Specimen Description URINE, CLEAN CATCH  Final   Special Requests Normal  Final   Culture   Final    NO GROWTH Performed at [facility], [place] [address], [place], [place] [address]    Report Status 04/01/2017 FINAL  Final  Body fluid culture     Status: None (Preliminary result)   Collection Time: 04/03/17 12:06 PM  Result Value Ref Range Status   Specimen Description PLEURAL  Final   Special Requests NONE  Final   Gram Stain   Final    ABUNDANT WBC PRESENT, PREDOMINANTLY MONONUCLEAR NO ORGANISMS SEEN    Culture   Final    NO GROWTH < 24 HOURS Performed at [facility], [place] [address], [place], [place] [address]    Report Status PENDING  Incomplete    Studies/Results: Dg Chest Port 1 View  Result Date: 04/03/2017 CLINICAL DATA:  Status post left-sided thoracentesis with removal of 1 L fluid. History of cap Celsius sarcoma. EXAM: PORTABLE CHEST 1 VIEW COMPARISON:  PA and lateral chest x-ray of April 01, 2017 FINDINGS: No postprocedure pneumothorax is observed on the left. There is atelectatic change at the left base. The volume of pleural fluid is radiographically less today. On the right basilar atelectasis or infiltrate persists. The heart is mildly enlarged. The pulmonary vascularity is not engorged. IMPRESSION: No postprocedure pneumothorax following left-sided thoracentesis. Electronically Signed   By: David  Martinique M.D.   On: 04/03/2017 12:04   Dg C-arm 1-60 Min-no Report  Result Date: 04/04/2017 Fluoroscopy was utilized by the requesting physician.  No radiographic interpretation.    Assessment: Procedure(s): CYSTOSCOPY WITH RETROGRADE PYELOGRAM/URETERAL STENT PLACEMENT, 1 Day Post-Op  doing well.  The patient did have some prostatic urethral  trauma, and for this I have placed a catheter.  The catheter should remain in place for at least 3 days.  Currently, the patient appreciates the catheter as it minimizes his urinary frequency.  Fortunately, he is not having any associated side effects or pain from the catheter or the ureteral stent that was placed.  His renal function is slightly improved today. He continues to have severe scrotal edema,  and it seems to be more comfortable although I do not know that it is helps to have it propped up on a rolled towel.  Plan: Would recommend keeping the Foley catheter at least for 3 days.  However, if the catheter is helpful in management of his symptoms and the patient is more comfortable with the catheter, it can be changed within 4 weeks.  The stent will need to be changed in 3 or 4 months.  I will have the patient follow-up with me in clinic if he is able to, to discuss stent exchange.  Making a scrotal sling with a sheet that can then be tied around his neck can be helpful for the patient when he is ambulating.  Otherwise, he should keep a large rolled towel between his legs to keep his scrotum tipped up to allow it to more effectively drain.   Louis Meckel, MD Urology 04/05/2017, 8:41 AM

## 2017-04-05 NOTE — Progress Notes (Signed)
Critical Value Alert  Critical Value: Hemoglobin 6.6  Date & Time Notified: 04/05/2017 4010  Provider Notified: Tylene Fantasia  Orders Received: Physician to assess and address

## 2017-04-05 NOTE — Progress Notes (Signed)
Palliative care progress note  Reason for consult: Goals of care in light of metastatic Kaposi's sarcoma and pain management  I met today with [name].  He was much more awake and alert and had just finished working with physical therapy.  We reviewed conversation from yesterday (he remembers meeting with me but does not remember details of conversation).  He confirms conversation that I had with his mother that his goal continues to be to pursue further disease modifying therapy that Dr. Alen Blew had talked with him about (Taxol).  We discussed that the goal of continued chemotherapy is to add time in quality to his life.  I encouraged him to continue to work in conjunction with Dr. Alen Blew to determine if there may come a point where continuing to pursue disease modifying therapy is not likely to add quality time to his life.  He reports understanding this.  We discussed his pain regimen.  He continues to report having some pain in his lower sternum and right side of his ribs.  He states that it gets better when he uses the Dilaudid but he "does not want to get hooked on anything."  We discussed that the goal of utilizing pain medications is to allow him to improve his functional status which is very important in light of the fact that he has metastatic disease that cannot be cured.  He reports understanding this and states that he will begin to use pain medication when necessary in order to allow [place] to better titrate his pain medication to determine his overall need in order to be more comfortable and functional.  We also discussed several social stressors in his life including both monetary and legal issues largely centered around continued bills that he is unable to pay.  He reports that he is very concerned that his mother is worried about these things now and does not want to burden her with this.  We made a plan to meet again tomorrow to continue to discuss pain regimen after he utilizes  rescue medications as needed to determine how much medication will take for him to be more comfortable/functional.  Total time: 30 minutes Greater than 50%  of this time was spent counseling and coordinating care related to the above assessment and plan.  Micheline Rough, MD [facility] [phone]

## 2017-04-05 NOTE — Progress Notes (Addendum)
PROGRESS NOTE    Victor Hayden  UKG:[id number] DOB: 02/26/1971 DOA: 03/30/2017 PCP: Mackie Pai, PA-C   Brief Narrative: 46 year old male with history of rapidly worsening widespread classic Kaposi's sarcoma presenting with worsening chest pain, shortness of breath, anasarca.  CT angiogram negative for PE.  Assessment & Plan:  #Dyspnea associated with chest pain: CT angiogram, VQ scan low probability for PE.  Echocardiogram with EF within normal limit.  PE ruled out. -Concern for Kaposi's sarcoma involving the long, pulmonary consult appreciated.  Status post left thoracocentesis on 12/4 with removal of 1100 cc of fluid, follow-up pending test results, cultures. -no SOB today  #Bilateral lower extremities edema/scrotal edema: Patient with very low albumin.   -Responding with IV Lasix in addition to IV albumin.  Serum creatinine level improving.  Continue to monitor.   Unable to order IV torsemide or IV Bumex because of shortage.  #Acute kidney injury likely due to obstruction,? contrast associated : Status post stent placement.  Continue IV Lasix, creatinine improving.  Avoid nephrotoxins.    #Hyponatremia: Serum sodium level stable.  On diuretics for the management of edema.  Minimize fluid intake.  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  Oncology follow-up ongoing.  May consider chemo after discharge.  Palliative care evaluation ongoing.  Continue supportive care and pain management.  #Cancer pain: Continue current pain management.  Patient looks comfortable. -Patient is on MS Contin 15 mg twice a day and Dilaudid as needed with stool softener.  #Left hydroureteronephrosis: Status post a stent placement by urology today.  Urologist recommended to continue Foley catheter for a month and outpatient follow-up.  #Thrombocytopenia in the setting of chemotherapy and malignancy: Monitor platelet count.  No sign of bleeding.  Platelet count continue to drop.  No sign of  bleeding.  #Anemia in the setting of chemo and malignancy/iron deficiency anemia: Plan for 2 units of red blood cell transfusion today.  Discussed with the patient. -Ordered a dose of Fereheme for iron deficiency.  #Failure to thrive and generalized weakness with overall poor prognosis.  Palliative care evaluation ongoing.  Patient is DNR.  DVT prophylaxis:  SCD Code Status: DNR Family Communication: Discussed with the patient's mother yesterday Disposition Plan: Inpatient    Consultants:   Oncology  Palliative care  Urologist  Procedures: Ureteral stent placement, thoracocentesis Antimicrobials: None  Subjective: Seen and examined at bedside.  Shortness of breath is better.  Denies chest pain.  No nausea vomiting.  Objective: Vitals:   04/04/17 1600 04/04/17 1934 04/04/17 1957 04/05/17 0502  BP: (!) 99/42  (!) 98/50 (!) 97/56  Pulse: 99  (!) 111 (!) 106  Resp:   18 18  Temp: 99.5 F (37.5 C)  99.1 F (37.3 C) 99 F (37.2 C)  TempSrc: Oral  Oral Oral  SpO2: 98% 97% 92% 95%  Weight:      Height:        Intake/Output Summary (Last 24 hours) at 04/05/2017 1122 Last data filed at 04/05/2017 1111 Gross per 24 hour  Intake 1150 ml  Output 1500 ml  Net -350 ml   Filed Weights   03/30/17 1925  Weight: 93 kg (205 lb)    Examination:  General exam:  ill looking male lying in bed comfortable, not in distress Respiratory system: Clear bilateral, respiratory effort normal Cardiovascular system: Regular rate rhythm, S1-S2 normal.  Bilateral lower extremities pitting edema. Gastrointestinal system: Abdomen is nondistended, soft and nontender. Normal bowel sounds heard. Central nervous system: Alert awake and following commands  Extremities: Consistent with Kaposi's sarcoma and edema Psychiatry: Judgement and insight appear normal. Mood & affect appropriate.  GU: Scrotal edema ++.   Data Reviewed: I have personally reviewed following labs and imaging  studies  CBC: Recent Labs  Lab 03/30/17 2014  04/01/17 0153 04/02/17 0352 04/03/17 0356 04/04/17 0405 04/05/17 0331  WBC 4.9   < > 5.6 4.9 3.7* 5.0 4.7  NEUTROABS 3.3  --   --   --   --  3.0  --   HGB 9.2*   < > 8.5* 7.2* 7.3* 7.5* 6.6*  HCT 28.8*   < > 25.8* 21.6* 22.1* 22.6* 20.1*  MCV 84.0   < > 83.5 83.4 83.4 83.7 83.8  PLT 107*   < > 94* 79* 65* 71* 68*   < > = values in this interval not displayed.   Basic Metabolic Panel: Recent Labs  Lab 04/01/17 0153 04/02/17 0352 04/03/17 0356 04/04/17 0405 04/05/17 0331  NA 129* 128* 128* 127* 127*  K 3.9 3.5 3.8 4.1 4.1  CL 98* 98* 98* 97* 96*  CO2 23 23 25 24 27   GLUCOSE 131* 120* 106* 126* 128*  BUN 22* 28* 26* 27* 27*  CREATININE 1.09 1.43* 1.49* 1.51* 1.36*  CALCIUM 7.5* 7.3* 7.4* 7.5* 7.7*  MG 1.8  --   --   --   --   PHOS  --   --   --   --  4.1   GFR: Estimated Creatinine Clearance: 76.1 mL/min (A) (by C-G formula based on SCr of 1.36 mg/dL (H)). Liver Function Tests: Recent Labs  Lab 03/30/17 2014 04/03/17 1143 04/05/17 0331  AST 34  --   --   ALT 12*  --   --   ALKPHOS 56  --   --   BILITOT 0.9  --   --   PROT 5.5* 4.9*  --   ALBUMIN 2.1*  --  2.1*   No results for input(s): LIPASE, AMYLASE in the last 168 hours. No results for input(s): AMMONIA in the last 168 hours. Coagulation Profile: Recent Labs  Lab 03/30/17 2014  INR 1.08   Cardiac Enzymes: Recent Labs  Lab 03/31/17 0049  TROPONINI <0.03   BNP (last 3 results) Recent Labs    03/01/17 1036  PROBNP 10.0   HbA1C: No results for input(s): HGBA1C in the last 72 hours. CBG: No results for input(s): GLUCAP in the last 168 hours. Lipid Profile: Recent Labs    04/03/17 1143  CHOL 60   Thyroid Function Tests: No results for input(s): TSH, T4TOTAL, FREET4, T3FREE, THYROIDAB in the last 72 hours. Anemia Panel: No results for input(s): VITAMINB12, FOLATE, FERRITIN, TIBC, IRON, RETICCTPCT in the last 72 hours. Sepsis Labs: No  results for input(s): PROCALCITON, LATICACIDVEN in the last 168 hours.  Recent Results (from the past 240 hour(s))  Urine Culture     Status: None   Collection Time: 03/30/17 11:33 PM  Result Value Ref Range Status   Specimen Description URINE, CLEAN CATCH  Final   Special Requests Normal  Final   Culture   Final    NO GROWTH Performed at [facility], [address] [place], [place] [address]    Report Status 04/01/2017 FINAL  Final  Body fluid culture     Status: None (Preliminary result)   Collection Time: 04/03/17 12:06 PM  Result Value Ref Range Status   Specimen Description PLEURAL  Final   Special Requests NONE  Final  Gram Stain   Final    ABUNDANT WBC PRESENT, PREDOMINANTLY MONONUCLEAR NO ORGANISMS SEEN    Culture   Final    NO GROWTH 2 DAYS Performed at [facility] [address] [place],  [address]    Report Status PENDING  Incomplete         Radiology Studies: Dg Chest Port 1 View  Result Date: 04/03/2017 CLINICAL DATA:  Status post left-sided thoracentesis with removal of 1 L fluid. History of cap Celsius sarcoma. EXAM: PORTABLE CHEST 1 VIEW COMPARISON:  PA and lateral chest x-ray of April 01, 2017 FINDINGS: No postprocedure pneumothorax is observed on the left. There is atelectatic change at the left base. The volume of pleural fluid is radiographically less today. On the right basilar atelectasis or infiltrate persists. The heart is mildly enlarged. The pulmonary vascularity is not engorged. IMPRESSION: No postprocedure pneumothorax following left-sided thoracentesis. Electronically Signed   By: David  Martinique M.D.   On: 04/03/2017 12:04   Dg C-arm 1-60 Min-no Report  Result Date: 04/04/2017 Fluoroscopy was utilized by the requesting physician.  No radiographic interpretation.        Scheduled Meds: . budesonide (PULMICORT) nebulizer solution  0.25 mg Nebulization BID  . docusate sodium  100 mg Oral BID  . furosemide  40 mg  Intravenous Q12H  . morphine  15 mg Oral Q12H  . pantoprazole  40 mg Oral Daily  . polyethylene glycol  17 g Oral Daily  . senna-docusate  1 tablet Oral BID  . sucralfate  1 g Oral TID WC & HS   Continuous Infusions: . sodium chloride    . albumin human       LOS: 6 days    Dron Tanna Furry, MD [facility] Pager [phone]  If 7PM-7AM, please contact night-coverage [email or web address] Password [email or web address] 04/05/2017, 11:22 AM

## 2017-04-05 NOTE — Evaluation (Addendum)
Physical Therapy Evaluation Patient Details Name: Victor Hayden MRN: [id number] DOB: Jan 06, 1971 Today's Date: 04/05/2017   History of Present Illness  46 year old male with history of rapidly worsening widespread classic Kaposi's sarcoma presenting 03/30/17 with worsening chest pain, shortness of breath, anasarca. S/P ureter stent 04/04/17  Clinical Impression  The patient ambulated x 10' with much effort and  Pain. Unsteady due to significant lower body edema. Pt admitted with above diagnosis. Pt currently with functional limitations due to the deficits listed below (see PT Problem List).  Pt will benefit from skilled PT to increase their independence and safety with mobility to allow discharge to the venue listed below.       Follow Up Recommendations SNF    Equipment Recommendations  None recommended by PT    Recommendations for Other Services   OT consult PLEASE    Precautions / Restrictions Precautions Precautions: Fall Precaution Comments: severe scrotal and  LE edema      Mobility  Bed Mobility Overal bed mobility: Needs Assistance Bed Mobility: Supine to Sit     Supine to sit: +2 for safety/equipment;+2 for physical assistance;Total assist     General bed mobility comments: assist with both legs, trunk, use of bed pad to slide forward. Patient expresses severe pain in groin area  Transfers Overall transfer level: Needs assistance Equipment used: Rolling walker (2 wheeled) Transfers: Sit to/from Stand Sit to Stand: Mod assist;+2 physical assistance;+2 safety/equipment         General transfer comment: assist to rise from bed to sit to recliner. wide base of support  Ambulation/Gait Ambulation/Gait assistance: Mod assist;+2 physical assistance;+2 safety/equipment Ambulation Distance (Feet): 10 Feet Assistive device: Rolling walker (2 wheeled) Gait Pattern/deviations: Step-to pattern;Wide base of support;Decreased step length - right;Decreased step  length - left     General Gait Details: Unable to have legs  near each othe, therefore, does not fit inside the RW. Assist to prevent RW from sliding out. each step requires much effort. patient complains of severe pain in legs.  Stairs            Wheelchair Mobility    Modified Rankin (Stroke Patients Only)       Balance Overall balance assessment: Needs assistance Sitting-balance support: Feet supported;Bilateral upper extremity supported Sitting balance-Leahy Scale: Poor   Postural control: Posterior lean Standing balance support: Bilateral upper extremity supported;During functional activity Standing balance-Leahy Scale: Poor                               Pertinent Vitals/Pain Pain Assessment: 0-10 Pain Score: 10-Worst pain ever Pain Location: scrotum, chest bilateral with UE use, Pain Descriptors / Indicators: Contraction;Cramping;Crushing;Discomfort;Grimacing;Guarding;Crying;Moaning Pain Intervention(s): Limited activity within patient's tolerance;Monitored during session;Premedicated before session;Repositioned    Home Living Family/patient expects to be discharged to:: Unsure Living Arrangements: Alone               Additional Comments: patient was too distracted about discomfort so did not obtain history of home situation    Prior Function                 Hand Dominance        Extremity/Trunk Assessment   Upper Extremity Assessment Upper Extremity Assessment: Generalized weakness    Lower Extremity Assessment Lower Extremity Assessment: RLE deficits/detail;LLE deficits/detail RLE Deficits / Details: severe edema with skin color changes, very tight and hard edema. decreased knee extension. LLE Deficits / Details: similar to  right    Cervical / Trunk Assessment Cervical / Trunk Assessment: Other exceptions Cervical / Trunk Exceptions: head forward, shoulders hunched  Communication   Communication: No difficulties   Cognition Arousal/Alertness: Awake/alert Behavior During Therapy: Anxious;WFL for tasks assessed/performed Overall Cognitive Status: Within Functional Limits for tasks assessed                                        General Comments      Exercises  Active assistive stretches of bilateral legs.  provided patient with a leg lifter a  And instructed in self stretching of the legs  and use it for self repositoning of the legs..  Patient said that he would try.   Assessment/Plan    PT Assessment Patient needs continued PT services  PT Problem List Decreased strength;Decreased range of motion;Decreased activity tolerance;Decreased balance;Decreased mobility;Decreased knowledge of precautions;Decreased safety awareness;Decreased knowledge of use of DME;Pain;Decreased skin integrity       PT Treatment Interventions DME instruction;Gait training;Functional mobility training;Therapeutic activities;Therapeutic exercise;Patient/family education    PT Goals (Current goals can be found in the Care Plan section)  Acute Rehab PT Goals Patient Stated Goal: to walk to the nuses desk. PT Goal Formulation: With patient Time For Goal Achievement: 04/19/17 Potential to Achieve Goals: Fair    Frequency Min 3X/week   Barriers to discharge Decreased caregiver support      Co-evaluation               AM-PAC PT "6 Clicks" Daily Activity  Outcome Measure Difficulty turning over in bed (including adjusting bedclothes, sheets and blankets)?: Unable Difficulty moving from lying on back to sitting on the side of the bed? : Unable Difficulty sitting down on and standing up from a chair with arms (e.g., wheelchair, bedside commode, etc,.)?: Unable Help needed moving to and from a bed to chair (including a wheelchair)?: Total Help needed walking in hospital room?: Total Help needed climbing 3-5 steps with a railing? : Total 6 Click Score: 6    End of Session Equipment Utilized  During Treatment: Gait belt Activity Tolerance: Patient limited by fatigue;Patient limited by pain Patient left: in chair;with call bell/phone within reach;with chair alarm set Nurse Communication: Mobility status PT Visit Diagnosis: Difficulty in walking, not elsewhere classified (R26.2);Pain Pain - Right/Left: Right Pain - part of body: Arm    Time: 8527-7824 PT Time Calculation (min) (ACUTE ONLY): 33 min   Charges:   PT Evaluation $PT Eval Moderate Complexity: 1 Mod PT Treatments $Gait Training: 8-22 mins   PT G CodesTresa Endo PT [phone]   [name] 04/05/2017, 4:32 PM

## 2017-04-06 DIAGNOSIS — C8380 Other non-follicular lymphoma, unspecified site: Secondary | ICD-10-CM

## 2017-04-06 DIAGNOSIS — R6510 Systemic inflammatory response syndrome (SIRS) of non-infectious origin without acute organ dysfunction: Secondary | ICD-10-CM

## 2017-04-06 DIAGNOSIS — R0600 Dyspnea, unspecified: Secondary | ICD-10-CM

## 2017-04-06 LAB — CBC
HCT: 24.9 % — ABNORMAL LOW (ref 39.0–52.0)
HEMOGLOBIN: 8.4 g/dL — AB (ref 13.0–17.0)
MCH: 28 pg (ref 26.0–34.0)
MCHC: 33.7 g/dL (ref 30.0–36.0)
MCV: 83 fL (ref 78.0–100.0)
Platelets: 58 10*3/uL — ABNORMAL LOW (ref 150–400)
RBC: 3 MIL/uL — AB (ref 4.22–5.81)
RDW: 16.8 % — ABNORMAL HIGH (ref 11.5–15.5)
WBC: 4.6 10*3/uL (ref 4.0–10.5)

## 2017-04-06 LAB — TYPE AND SCREEN
ABO/RH(D): O POS
ANTIBODY SCREEN: NEGATIVE
UNIT DIVISION: 0
UNIT DIVISION: 0

## 2017-04-06 LAB — BPAM RBC
BLOOD PRODUCT EXPIRATION DATE: 201901012359
BLOOD PRODUCT EXPIRATION DATE: 201901012359
ISSUE DATE / TIME: 201812061155
ISSUE DATE / TIME: 201812062312
UNIT TYPE AND RH: 5100
Unit Type and Rh: 5100

## 2017-04-06 LAB — RENAL FUNCTION PANEL
ANION GAP: 6 (ref 5–15)
Albumin: 2.3 g/dL — ABNORMAL LOW (ref 3.5–5.0)
BUN: 29 mg/dL — ABNORMAL HIGH (ref 6–20)
CALCIUM: 7.7 mg/dL — AB (ref 8.9–10.3)
CHLORIDE: 96 mmol/L — AB (ref 101–111)
CO2: 26 mmol/L (ref 22–32)
Creatinine, Ser: 1.35 mg/dL — ABNORMAL HIGH (ref 0.61–1.24)
GFR calc Af Amer: >60 mL/min (ref >60)
GFR calc non Af Amer: >60 mL/min (ref >60)
GLUCOSE: 139 mg/dL — AB (ref 65–99)
Phosphorus: 4.3 mg/dL (ref 2.5–4.6)
Potassium: 3.3 mmol/L — ABNORMAL LOW (ref 3.5–5.1)
SODIUM: 128 mmol/L — AB (ref 135–145)

## 2017-04-06 LAB — BODY FLUID CULTURE: CULTURE: NO GROWTH

## 2017-04-06 MED ORDER — OXYCODONE HCL 5 MG PO TABS
5.0000 mg | ORAL_TABLET | ORAL | Status: DC | PRN
  Administered 2017-04-06: 10 mg via ORAL
  Filled 2017-04-06: qty 2

## 2017-04-06 MED ORDER — HYDROMORPHONE HCL 1 MG/ML IJ SOLN
1.0000 mg | INTRAMUSCULAR | Status: DC | PRN
  Administered 2017-04-06 – 2017-04-07 (×5): 1 mg via INTRAVENOUS
  Filled 2017-04-06 (×5): qty 1

## 2017-04-06 MED ORDER — POTASSIUM CHLORIDE CRYS ER 20 MEQ PO TBCR
40.0000 meq | EXTENDED_RELEASE_TABLET | Freq: Every day | ORAL | Status: DC
  Administered 2017-04-06 – 2017-04-11 (×6): 40 meq via ORAL
  Filled 2017-04-06 (×7): qty 2

## 2017-04-06 NOTE — Progress Notes (Signed)
PROGRESS NOTE    Victor Hayden  IRJ:[id number] DOB: 07-10-1970 DOA: 03/30/2017 PCP: Mackie Pai, PA-C   Brief Narrative: 46 year old male with history of rapidly worsening widespread classic Kaposi's sarcoma presenting with worsening chest pain, shortness of breath, anasarca.  CT angiogram negative for PE.  Assessment & Plan:  #Dyspnea associated with chest pain: CT angiogram, VQ scan low probability for PE.  Echocardiogram with EF within normal limit.  PE ruled out. -Concern for Kaposi's sarcoma involving the long, pulmonary consult appreciated.  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  Pulmonologist recommended Pleurx drain placement if patient has recurrent accumulation of pleural effusion for symptom management of his dyspnea.  Repeat x-ray in the morning. -no SOB today  #Bilateral lower extremities edema/scrotal edema:  -Responding with IV Lasix.  Unable to order IV torsemide or IV Bumex because of shortage.  Continue  #Acute kidney injury likely due to obstruction,? contrast associated : Status post stent placement.  Continue IV Lasix, creatinine  level stable.  Avoid nephrotoxins.  Patient is nonoliguric.  #Hyponatremia: Serum sodium level 128.  On diuretics for the management of edema.  Minimize fluid intake.  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  Oncology follow-up ongoing.  May consider chemo after discharge.  Palliative care evaluation ongoing.  Continue supportive care and pain management.  #Cancer pain: Continue current pain management.  Patient looks comfortable. -Patient is on MS Contin 15 mg twice a day and Dilaudid as needed with stool softener.  #Left hydroureteronephrosis: Status post a stent placement by urology today.  Urologist recommended to continue Foley catheter for a month and  outpatient follow-up.  #Thrombocytopenia in the setting of chemotherapy and malignancy: Monitor platelet count.  No sign of bleeding.  Platelet count continue to drop.  Blood rate 58 today.  #Anemia in the setting of chemo and malignancy/iron deficiency anemia: Received  2 units of red blood cell transfusion on 12/6.   -received  a dose of Fereheme for iron deficiency.  #Failure to thrive and generalized weakness with overall poor prognosis.  Palliative care evaluation ongoing.  Patient is DNR.  No significant clinical improvement.  Social worker consulted for safe discharge planning.  Patient like to go home with family support at the end of this hospitalization.  DVT prophylaxis:  SCD Code Status: DNR Family Communication: No family at bedside. Disposition Plan: Inpatient    Consultants:   Oncology  Palliative care  Urologist  Procedures: Ureteral stent placement, thoracocentesis Antimicrobials: None  Subjective: Seen and examined at bedside.  Patient was somnolent because he received pain medication just prior to my visit.  Denied chest pain, nausea, vomiting. Objective: Vitals:   04/05/17 2355 04/06/17 0209 04/06/17 0418 04/06/17 0747  BP: 90/78 (!) 102/55 107/62   Pulse: (!) 110 (!) 103 (!) 102   Resp: 18 20 19    Temp: 98.5 F (36.9 C) 97.7 F (36.5 C) 97.9 F (36.6 C)   TempSrc: Oral Oral Oral   SpO2: 98% 97% 97% 95%  Weight:      Height:        Intake/Output Summary (Last 24 hours) at 04/06/2017 1241 Last data filed at 04/06/2017 0419 Gross per 24 hour  Intake 777 ml  Output 1300 ml  Net -523 ml   Filed Weights   03/30/17 1925  Weight: 93 kg (205 lb)    Examination:  General exam: Ill looking male mildly somnolent, not in distress Respiratory system: Bibasilar decreased breath sound, respiratory effort normal Cardiovascular system: Regular rate rhythm, S1-S2 normal.  Bilateral lower extremities pitting edema no significant  improvement. Gastrointestinal system: Abdomen is nondistended, soft and nontender. Normal bowel sounds heard. Central nervous system: Alert awake and following commands Extremities: Consistent with Kaposi's sarcoma and edema Psychiatry: Judgement and insight appear normal. Mood & affect appropriate.  GU: Scrotal edema ++.  Unchanged and has a Foley catheter.   Data Reviewed: I have personally reviewed following labs and imaging studies  CBC: Recent Labs  Lab 03/30/17 2014  04/02/17 0352 04/03/17 0356 04/04/17 0405 04/05/17 0331 04/06/17 0359  WBC 4.9   < > 4.9 3.7* 5.0 4.7 4.6  NEUTROABS 3.3  --   --   --  3.0  --   --   HGB 9.2*   < > 7.2* 7.3* 7.5* 6.6* 8.4*  HCT 28.8*   < > 21.6* 22.1* 22.6* 20.1* 24.9*  MCV 84.0   < > 83.4 83.4 83.7 83.8 83.0  PLT 107*   < > 79* 65* 71* 68* 58*   < > = values in this interval not displayed.   Basic Metabolic Panel: Recent Labs  Lab 04/01/17 0153 04/02/17 0352 04/03/17 0356 04/04/17 0405 04/05/17 0331 04/06/17 0359  NA 129* 128* 128* 127* 127* 128*  K 3.9 3.5 3.8 4.1 4.1 3.3*  CL 98* 98* 98* 97* 96* 96*  CO2 23 23 25 24 27 26   GLUCOSE 131* 120* 106* 126* 128* 139*  BUN 22* 28* 26* 27* 27* 29*  CREATININE 1.09 1.43* 1.49* 1.51* 1.36* 1.35*  CALCIUM 7.5* 7.3* 7.4* 7.5* 7.7* 7.7*  MG 1.8  --   --   --   --   --   PHOS  --   --   --   --  4.1 4.3   GFR: Estimated Creatinine Clearance: 76.7 mL/min (A) (by C-G formula based on SCr of 1.35 mg/dL (H)). Liver Function Tests: Recent Labs  Lab 03/30/17 2014 04/03/17 1143 04/05/17 0331 04/06/17 0359  AST 34  --   --   --   ALT 12*  --   --   --   ALKPHOS 56  --   --   --   BILITOT 0.9  --   --   --   PROT 5.5* 4.9*  --   --   ALBUMIN 2.1*  --  2.1* 2.3*   No results for input(s): LIPASE, AMYLASE in the last 168 hours. No results for input(s): AMMONIA in the last 168 hours. Coagulation Profile: Recent Labs  Lab 03/30/17 2014  INR 1.08   Cardiac Enzymes: Recent Labs  Lab  03/31/17 0049  TROPONINI <0.03   BNP (last 3 results) Recent Labs    03/01/17 1036  PROBNP 10.0   HbA1C: No results for input(s): HGBA1C in the last 72 hours. CBG: No results for input(s): GLUCAP in the last 168 hours. Lipid Profile: No results for input(s): CHOL, HDL, LDLCALC, TRIG, CHOLHDL, LDLDIRECT in the last 72 hours. Thyroid Function Tests: No results for input(s): TSH, T4TOTAL, FREET4, T3FREE, THYROIDAB in the last 72 hours. Anemia Panel: No results for input(s): VITAMINB12, FOLATE, FERRITIN, TIBC, IRON, RETICCTPCT in the last 72 hours. Sepsis Labs: No results for input(s): PROCALCITON, LATICACIDVEN in the last 168 hours.  Recent Results (from the past 240 hour(s))  Urine Culture     Status: None   Collection Time: 03/30/17  11:33 PM  Result Value Ref Range Status   Specimen Description URINE, CLEAN CATCH  Final   Special Requests Normal  Final   Culture   Final    NO GROWTH Performed at [facility], [address], [place], [place] [address]    Report Status 04/01/2017 FINAL  Final  Body fluid culture     Status: None (Preliminary result)   Collection Time: 04/03/17 12:06 PM  Result Value Ref Range Status   Specimen Description PLEURAL  Final   Special Requests NONE  Final   Gram Stain   Final    ABUNDANT WBC PRESENT, PREDOMINANTLY MONONUCLEAR NO ORGANISMS SEEN    Culture   Final    NO GROWTH 3 DAYS Performed at [facility], [place] [address], [place], [place] [address]    Report Status PENDING  Incomplete         Radiology Studies: No results found.      Scheduled Meds: . budesonide (PULMICORT) nebulizer solution  0.25 mg Nebulization BID  . docusate sodium  100 mg Oral BID  . furosemide  40 mg Intravenous Q12H  . morphine  15 mg Oral Q12H  . pantoprazole  40 mg Oral Daily  . polyethylene glycol  17 g Oral Daily  . potassium chloride  40 mEq Oral Daily  . senna-docusate  1 tablet Oral BID  . sucralfate  1 g Oral TID WC & HS    Continuous Infusions:    LOS: 7 days    Nehemias Sauceda Tanna Furry, MD [facility] Pager [phone]  If 7PM-7AM, please contact night-coverage [email or web address] Password [email or web address] 04/06/2017, 12:41 PM

## 2017-04-06 NOTE — Progress Notes (Signed)
I sent a message to Dr Domingo Cocking that Mr Janet Berlin stated that he was seeing people after the administration of Dilaudid.He Stated " I don't like to feel like I am hallucinating

## 2017-04-06 NOTE — Progress Notes (Signed)
PULMONARY / CRITICAL CARE MEDICINE   Name: Victor Hayden MRN: [id number] DOB: 1971-01-19    ADMISSION DATE:  03/30/2017 CONSULTATION DATE: 03/31/2017  REFERRING PROVIDER: Dr. 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  CT angio chest concerning for pulmonary embolism.  SUBJECTIVE:  No acute distress VITAL SIGNS: BP 107/62 (BP Location: Right Arm)   Pulse (!) 102   Temp 97.9 F (36.6 C) (Oral)   Resp 19   Ht 5\' 10"  (1.778 m)   Wt 205 lb (93 kg)   SpO2 95%   BMI 29.41 kg/m   INTAKE / OUTPUT: I/O last 3 completed shifts: In: 62 [P.O.:720; Blood:657; IV Piggyback:200] Out: 2000 [Urine:2000]  PHYSICAL EXAMINATION:  General: Chronically ill-appearing 46 year old male currently resting in bed no acute distress. HEENT: Mucous membranes are moist, normocephalic.  He does have some temporal wasting no jugular venous distention. Pulmonary: Decreased on the left, no accessory muscle use. Cardiac: Regular rate and rhythm without murmur rub gallop. Extremity: Chronic lower extremity edema with venous insufficiency changes Abdomen: Soft nontender: Awake alert oriented no focal deficits  LABS:  BMET Recent Labs  Lab 04/04/17 0405 04/05/17 0331 04/06/17 0359  NA 127* 127* 128*  K 4.1 4.1 3.3*  CL 97* 96* 96*  CO2 24 27 26   BUN 27* 27* 29*  CREATININE 1.51* 1.36* 1.35*  GLUCOSE 126* 128* 139*    Electrolytes Recent Labs  Lab 04/01/17 0153  04/04/17 0405 04/05/17 0331 04/06/17 0359  CALCIUM 7.5*   < > 7.5* 7.7* 7.7*  MG 1.8  --   --   --   --   PHOS  --   --   --  4.1 4.3   < > = values in this interval not displayed.    CBC Recent Labs  Lab 04/04/17 0405 04/05/17 0331 04/06/17 0359  WBC 5.0 4.7 4.6  HGB 7.5* 6.6* 8.4*  HCT 22.6* 20.1* 24.9*  PLT 71* 68* 58*    Coag's Recent Labs  Lab 03/30/17 2014   APTT 28  INR 1.08    Sepsis Markers No results for input(s): LATICACIDVEN, PROCALCITON, O2SATVEN in the last 168 hours.  ABG No results for input(s): PHART, PCO2ART, PO2ART in the last 168 hours.  Liver Enzymes Recent Labs  Lab 03/30/17 2014 04/05/17 0331 04/06/17 0359  AST 34  --   --   ALT 12*  --   --   ALKPHOS 56  --   --   BILITOT 0.9  --   --   ALBUMIN 2.1* 2.1* 2.3*    Cardiac Enzymes Recent Labs  Lab 03/31/17 0049  TROPONINI <0.03    Glucose No results for input(s): GLUCAP in the last 168 hours.  Imaging No results found.   STUDIES:  CT angio chest 12/01 >> poor study, possible PE b/l, b/l nodular infiltrates, Lt > Rt pleural effusion Doppler legs b/l 12/02 >> no DVT Echo 12/02 >> EF 55 to 60%, normal RV function and PA pressure VQ scan 12/3: normal perfusion/ decreased Lower lobe ventilation Site: left Date: 12/4  Pleural fluid Serum   LDH: 1137 LDH: 164  Protein: Less than 3 Protein: 4.9  Cholesterol: 27 Cholesterol: 60  Afb:   Gm stain:    Culture: Abundant WBCs>>>   Fungal:    Hct:    Color: Yellow   Wbc: 2660  Neutrophils: 1   Lymph: 2   Ph:    Rheumatoid factor:   Adenosine Deaminase:   Glucose:    Cytology: Primary effusion lymphoma      Special notes:      DISCUSSION: 46 yo male with pleuritic type chest pain.  CT angio chest was suggestive of PE, but was poor study.  Echo unremarkable and doppler legs b/l negative.  Still has increased risk of thromboembolic disease in setting of Kaposi's sarcoma.  Underwent thoracentesis on 12/4 drained 1100 cc of concentrated orange fluid.  Preliminary findings consistent with exudate concerning for malignancy   ASSESSMENT / PLAN:  Pleuritic chest pain w/ normal perfusion on v/q scan and Left exudative Pleural effusion. PCXR personally reviewed: Bibasilar atelectasis, decreased right effusion -I am worried this could represent further metastasis -He feels better following  thoracentesis -cultures negative to date  -called cytology--> pleural fluid evaluation was consistent with primary effusion lymphoma I discussed these findings with the patient plan We will repeat chest x-ray in a.m. If/when pleural fluid re-accumulates would recommend interventional radiology consultation for Pleurx drain to provide palliative treatment in regards to dyspnea from pleural effusion  Kaposi's sarcoma. Per onc   Mild AK I with evidence of left hydroureteral nephrosis Hyponatremia Obstructive uropathy Per urology   Anemia without evidence of bleeding.  I do not think that this is pulmonary source Per IM   Erick Colace ACNP-BC [place] Pager # [phone] OR # [phone] if no answer

## 2017-04-06 NOTE — Progress Notes (Signed)
The results of the cytology from his pleural effusion was reviewed and discussed with the patient today.  These findings support the finding of primary effusion lymphoma.  The natural course of this disease as well as treatment option was reviewed today with the patient.  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  The prognosis associated with this lymphoma is very poor despite aggressive treatment.  His lymphoma is CD20 negative and rituximab cannot be an option for treatment.  To treat this lymphoma he will require multiagent systemic chemotherapy in the setting of multiorgan dysfunction and poor performance status.  Overall survival associated with this lymphoma even with aggressive treatment rarely exceeds 6 months.  His life expectancy is likely less than 3 months.  Given these findings and his overall debilitation I recommended comfort care moving forward.  He understands his diagnosis fully and is not willing to proceed with aggressive therapy given the poor odds of success long-term.  I appreciate the input from pulmonary medicine and I agree with the Pleurx catheter if pleural effusion re-accumulates.  Any therapy would be directed towards comfort moving forward.

## 2017-04-06 NOTE — Progress Notes (Signed)
Comfort approach noted  pccm will sign off   Dr. Brand Males, M.D., [facility].C.P Pulmonary and Critical Care Medicine Staff Physician, [place] Director - Interstitial Lung Disease  Program  Pulmonary [place] [facility] [place], [place], [address]  Pager: [phone], If no answer or between  15:00h - 7:00h: call [phone] Telephone: [phone]

## 2017-04-06 NOTE — Progress Notes (Signed)
Palliative care progress note  Reason for consult: Goals of care in light of metastatic Kaposi's sarcoma and pain management  I met today with [name] and his mother.  We discussed his pain regimen.  He continues to report having some pain in his lower sternum and right side of his ribs.  He states that it gets better when he uses the Dilaudid.  I discussed with his mother the conversation that he and I had had from yesterday that goal of utilizing pain medications is to allow him to improve his functional status which is very important in light of the fact that he has metastatic disease that cannot be cured.    He has not used much in rescue pain medication and plan for addition of oral rescue medication to see if this can effectively control his pain.  I placed an order for oxycodone 5-10 mg every 3 hours as needed.  If this is insufficient to relieve his pain, he knows that he can request backup IV Dilaudid approximately 40 minutes after taking oral medication.  I asked his understanding of his conversation with Dr. Alen Blew, and at that point his mother asked about plans moving forward for treatment of his cancer.  At that point, [name] stated that "there is no plan for further treatment.  More chemo is not going to help."    I talked with them about the new diagnosis of primary effusion lymphoma.  We discussed that although he is not a candidate for further chemotherapy, this does not mean that there is not care left to give.  Rather, it means that we need to focus on him living each day as well as possible through aggressive symptom management.  He states that this is also what Dr. Alen Blew had talked with him about when he visited with him earlier today.  He and his mother both report needing some time to process everything that they have learned today.  He requested to defer further conversations about next steps until tomorrow.  We made a plan to meet again tomorrow after lunch to  continue to discuss pain regimen and discuss next steps moving forward and developing a care plan with a focus on remaining as comfortable as possible with the understanding that he has a significantly life limiting illness.  Plan to bring up the benefits of hospice care and his situation at that time.  Total time: 30 minutes Greater than 50%  of this time was spent counseling and coordinating care related to the above assessment and plan.  Micheline Rough, MD [place] Team [phone]

## 2017-04-06 NOTE — Clinical Social Work Note (Signed)
Clinical Social Work Assessment  Patient Details  Name: Victor Hayden MRN: [id number] Date of Birth: [birth year]  Date of referral:  04/06/17               Reason for consult:  Financial Concerns, Facility Placement                Permission sought to share information with:  Family Supports Permission granted to share information::  Yes, Verbal Permission Granted  Name::     mother Chartered certified accountant::     Relationship::     Contact Information:     Housing/Transportation Living arrangements for the past 2 months:  Single Family Home Source of Information:  Patient, Parent, Medical Team Patient Interpreter Needed:  None Criminal Activity/Legal Involvement Pertinent to Current Situation/Hospitalization:  No - Comment as needed Significant Relationships:  Parents, Friend, Neighbor Lives with:  Self Do you feel safe going back to the place where you live?  Yes Need for family participation in patient care:  Yes (Comment)(mother and neighbors/friends involved)  Care giving concerns:  Pt from home where he resides alone. Is patient of [facility]. States he is declining and prior to this admission was managing at home with help from his mom at times. States he understands that he will need additional help going forward.   Social Worker assessment / plan:   CSW consulted to assess potential SNF palcement. Met with pt at bedside and also discussed with mother via phone. Pt alert and oriented, pleasant and engaged although appropriately sad due to nature of this hospitalization and conversation. States he understands per discussions with oncology and palliative that there is unlikely to be any further successful treatment for his cancer and he is approaching beginning to plan for end of life. States, "I know I may have a little while longer to live, I want to have things worked out as best I can." Reports he is not interested in SNF at this time and is planning to return home at [place], is working  with family/neighbors/friends to organize supervision and care. States, "I've spent my life taking care of others. It is hard for me to ask for help. I am working on it because I know they want to be there for me." Pt states he has had issues with finances- lives in a family-owned home but has trouble paying utility bills. Has applied for disability and Medicaid but states complications keep happening and at this time he has no insurance or income. States he has been working with [employer] re: further help with applications and CSW will attempt to find out status. Mother states she is inquiring as well.   Plan: TBD. Pt hoping to return home with additional support from family. States due to poor prognosis he is beginning to think about what other services he may need. CSW will follow.    Employment status:  Unemployed Forensic scientist:  Self Pay (Medicaid Pending) PT Recommendations:  [place] / Referral to community resources:     Patient/Family's Response to care:  Engaged and appreciative  Patient/Family's Understanding of and Emotional Response to Diagnosis, Current Treatment, and Prognosis:  Pt and mother demonstrate adequate understanding of current treatment, prognosis, are clear in their wishes and also with articulating that they are in process of accepting pt's prognosis and adapting their wishes due to this. Emotional response- sad but appropriate to situation  Emotional Assessment Appearance:  Appears stated age Attitude/Demeanor/Rapport:  (engaged) Affect (typically observed):  Accepting, Calm Orientation:  Oriented to Self, Oriented to Place, Oriented to  Time, Oriented to Situation Alcohol / Substance use:  Not Applicable Psych involvement (Current and /or in the community):  No (Comment)  Discharge Needs  Concerns to be addressed:  Discharge Planning Concerns Readmission within the last 30 days:  No Current discharge risk:  Dependent with  Mobility Barriers to Discharge:  Continued Medical Work up   [employer], LCSW 04/06/2017, 10:04 AM  [phone]

## 2017-04-07 ENCOUNTER — Inpatient Hospital Stay (HOSPITAL_COMMUNITY): Payer: Medicaid Other

## 2017-04-07 LAB — RENAL FUNCTION PANEL
ALBUMIN: 2.2 g/dL — AB (ref 3.5–5.0)
ANION GAP: 9 (ref 5–15)
BUN: 34 mg/dL — AB (ref 6–20)
CALCIUM: 7.7 mg/dL — AB (ref 8.9–10.3)
CO2: 26 mmol/L (ref 22–32)
Chloride: 91 mmol/L — ABNORMAL LOW (ref 101–111)
Creatinine, Ser: 1.31 mg/dL — ABNORMAL HIGH (ref 0.61–1.24)
GFR calc Af Amer: >60 mL/min (ref >60)
GLUCOSE: 123 mg/dL — AB (ref 65–99)
PHOSPHORUS: 4.9 mg/dL — AB (ref 2.5–4.6)
Potassium: 3.9 mmol/L (ref 3.5–5.1)
SODIUM: 126 mmol/L — AB (ref 135–145)

## 2017-04-07 LAB — SEROTONIN RELEASE ASSAY (SRA)
SRA 100IU/mL UFH Ser-aCnc: <1 % (ref 0–20)
SRA, LOW DOSE HEPARIN: 1 % (ref 0–20)

## 2017-04-07 LAB — CBC
HCT: 26.3 % — ABNORMAL LOW (ref 39.0–52.0)
HEMOGLOBIN: 8.8 g/dL — AB (ref 13.0–17.0)
MCH: 27.9 pg (ref 26.0–34.0)
MCHC: 33.5 g/dL (ref 30.0–36.0)
MCV: 83.5 fL (ref 78.0–100.0)
Platelets: 52 10*3/uL — ABNORMAL LOW (ref 150–400)
RBC: 3.15 MIL/uL — ABNORMAL LOW (ref 4.22–5.81)
RDW: 17 % — AB (ref 11.5–15.5)
WBC: 6.2 10*3/uL (ref 4.0–10.5)

## 2017-04-07 MED ORDER — HYDROMORPHONE HCL 1 MG/ML IJ SOLN
0.5000 mg | INTRAMUSCULAR | Status: DC | PRN
  Administered 2017-04-09 – 2017-04-12 (×8): 0.5 mg via INTRAVENOUS
  Filled 2017-04-07 (×6): qty 1

## 2017-04-07 MED ORDER — HYDROMORPHONE BOLUS VIA INFUSION: 0.5000 mg | INTRAVENOUS | Status: DC | PRN

## 2017-04-07 MED ORDER — HYDROMORPHONE 1 MG/ML IV SOLN
INTRAVENOUS | Status: DC
  Administered 2017-04-07: 12:00:00 via INTRAVENOUS
  Administered 2017-04-07: 1.7 mg via INTRAVENOUS
  Administered 2017-04-07: 1.5 mg via INTRAVENOUS
  Administered 2017-04-08 (×2): 0.9 mg via INTRAVENOUS
  Administered 2017-04-08: 0.3 mg via INTRAVENOUS
  Administered 2017-04-08: 1.2 mg via INTRAVENOUS
  Administered 2017-04-08: 2.1 mg via INTRAVENOUS
  Administered 2017-04-09: 0.9 mg via INTRAVENOUS
  Administered 2017-04-09: 0.3 mg via INTRAVENOUS
  Administered 2017-04-09: 0.6 mg via INTRAVENOUS
  Administered 2017-04-09: 0.9 mg via INTRAVENOUS
  Administered 2017-04-09: 1.2 mg via INTRAVENOUS
  Administered 2017-04-09 (×2): 0.9 mg via INTRAVENOUS
  Administered 2017-04-10: 13:00:00 via INTRAVENOUS
  Administered 2017-04-10: 2.7 mg via INTRAVENOUS
  Administered 2017-04-10: 0.6 mg via INTRAVENOUS
  Administered 2017-04-10: 0.2 mg via INTRAVENOUS
  Administered 2017-04-10: 1.2 mg via INTRAVENOUS
  Administered 2017-04-10: 0.9 mg via INTRAVENOUS
  Administered 2017-04-11: 1.7 mg via INTRAVENOUS
  Administered 2017-04-11: 1.8 mg via INTRAVENOUS
  Administered 2017-04-11: 0.6 mg via INTRAVENOUS
  Administered 2017-04-11: 0.9 mg via INTRAVENOUS
  Administered 2017-04-11: 2.1 mg via INTRAVENOUS
  Administered 2017-04-12: 1.5 mg via INTRAVENOUS
  Administered 2017-04-12: 1.8 mg via INTRAVENOUS
  Administered 2017-04-12: 1.5 mg via INTRAVENOUS
  Filled 2017-04-07 (×2): qty 25

## 2017-04-07 MED ORDER — ONDANSETRON HCL 4 MG/2ML IJ SOLN
4.0000 mg | Freq: Four times a day (QID) | INTRAMUSCULAR | Status: DC | PRN
  Administered 2017-04-10: 4 mg via INTRAVENOUS
  Filled 2017-04-07: qty 2

## 2017-04-07 NOTE — Progress Notes (Signed)
Palliative care progress note  Reason for consult: Goals of care in light of metastatic Kaposi's sarcoma and primary effusion lymphoma and pain management  I met today with [name] and his mother.  We discussed his pain regimen.  He continues to report having some pain in his lower sternum and right side of his ribs.  He states that it gets better when he uses the Dilaudid.  He has also been having worsening SOB.  We discussed plan to focus on comfort.  His pain/SOB seems to be getting more prevalent, and I talked with him about plan for dilaudid PCA to better determine his needs moving forward for control of these symptoms.  He reports that his ultimate goals is to get back to his own home.  We discussed how hospice would be the best support for him with this in mind.   His mother is concerned about plan to transition home as she reports that his symptoms seem to be worsening rather quickly.  He reports that Marni Griffon from [facility] has been talking with him about plan for possible pleurex placement.  I relayed that I believe that this will be a benefit to him moving forward.  -He would like to focus on comfort moving forward.  Plan for PCA to determine pain management/dyspnea needs.  This is for comfort and therefore he does not need ETCO2 or pulse ox monitoring. -Appreciate PCCM assistance in possible pleurex placement - Will continue to discuss discharge options based on clinical course over the next 48 hours.  Likely to home with hospice vs residential hospice facility. - D/W Dr Carolin Sicks.  Total time: 40 minutes Greater than 50%  of this time was spent counseling and coordinating care related to the above assessment and plan.  Micheline Rough, MD [place] Team [phone]

## 2017-04-07 NOTE — Evaluation (Signed)
Occupational Therapy Evaluation Patient Details Name: Victor Hayden MRN: [id number] DOB: 26-Jul-1970 Today's Date: 04/07/2017    History of Present Illness 46 year old male with history of rapidly worsening widespread classic Kaposi's sarcoma presenting 03/30/17 with worsening chest pain, shortness of breath, anasarca. S/P ureter stent 04/04/17   Clinical Impression   Pt required max assist for bed mobility and +2 mod assist for functional transfer into the chair today. Pt reporting L hand cramping up badly when trying to use leg lifter to move LEs to EOB so required therapist assist to bring LEs over to EOB today. Will benefit from OT to increase independence and safety with ADL and functional transfers. Recommend SNF at d/c however pt and mother state their goal is to return home. Currently pt is requiring +2 assist however.     Follow Up Recommendations  SNF;Supervision/Assistance - 24 hour    Equipment Recommendations  3 in 1 bedside commode    Recommendations for Other Services       Precautions / Restrictions Precautions Precautions: Fall Precaution Comments: severe scrotal and  LE edema Restrictions Weight Bearing Restrictions: No      Mobility Bed Mobility Overal bed mobility: Needs Assistance Bed Mobility: Supine to Sit     Supine to sit: Max assist     General bed mobility comments: assist for LEs over to EOB and trunk to upright. Used bed pad to scoot patient around and toward EOB.   Transfers Overall transfer level: Needs assistance Equipment used: Rolling walker (2 wheeled) Transfers: Sit to/from Stand Sit to Stand: +2 safety/equipment;+2 physical assistance;Mod assist         General transfer comment: assist to rise and steady. Wide base of support. Cues for correct hand placement.     Balance                                           ADL either performed or assessed with clinical judgement   ADL Overall ADL's : Needs  assistance/impaired     Grooming: Wash/dry hands;Set up;Bed level   Upper Body Bathing: Sitting;Set up;Supervision/ safety   Lower Body Bathing: +2 for safety/equipment;+2 for physical assistance;Total assistance;Sit to/from stand   Upper Body Dressing : Minimal assistance;Sitting   Lower Body Dressing: +2 for physical assistance;+2 for safety/equipment;Total assistance;Sit to/from stand   Toilet Transfer: +2 for physical assistance;+2 for safety/equipment;Moderate assistance;RW   Toileting- Clothing Manipulation and Hygiene: +2 for physical assistance;+2 for safety/equipment;Sit to/from stand;Total assistance         General ADL Comments: Pt having to stand with LEs wide apart due to scrotal issues so unable to stay inside of walker with mobility. Needed balance support with all upright activity as well as OT providing stability for walker to not tilt. Pt desiring to sit up in chair this pm.      Vision Patient Visual Report: No change from baseline       Perception     Praxis      Pertinent Vitals/Pain Pain Assessment: Faces Pain Score: 9  Pain Location: L hand cramping during session with pulling on leg lifter. Scrotum.  Pain Descriptors / Indicators: Cramping;Sharp Pain Intervention(s): Monitored during session     Hand Dominance     Extremity/Trunk Assessment Upper Extremity Assessment Upper Extremity Assessment: Generalized weakness           Communication Communication Communication: No difficulties  Cognition Arousal/Alertness: Awake/alert Behavior During Therapy: WFL for tasks assessed/performed Overall Cognitive Status: Within Functional Limits for tasks assessed                                     General Comments       Exercises     Shoulder Instructions      Home Living Family/patient expects to be discharged to:: Unsure Living Arrangements: Alone                               Additional Comments: Pt's  mother states the plan is for pt to return home with assist. Pt states he was able to do more for himself at home PTA but did not elaborate on details. Will need to obtain more information regarding d/c environment.       Prior Functioning/Environment                   OT Problem List: Decreased strength;Decreased knowledge of use of DME or AE      OT Treatment/Interventions: Self-care/ADL training;DME and/or AE instruction;Therapeutic activities;Patient/family education    OT Goals(Current goals can be found in the care plan section) Acute Rehab OT Goals Patient Stated Goal: wants to get up in chair.  OT Goal Formulation: With patient/family Time For Goal Achievement: 04/21/17 Potential to Achieve Goals: Good  OT Frequency: Min 2X/week   Barriers to D/C:            Co-evaluation              AM-PAC PT "6 Clicks" Daily Activity     Outcome Measure Help from another person eating meals?: A Little Help from another person taking care of personal grooming?: A Little Help from another person toileting, which includes using toliet, bedpan, or urinal?: A Lot Help from another person bathing (including washing, rinsing, drying)?: A Lot Help from another person to put on and taking off regular upper body clothing?: A Little Help from another person to put on and taking off regular lower body clothing?: A Lot 6 Click Score: 15   End of Session Equipment Utilized During Treatment: Rolling walker Nurse Communication: Mobility status  Activity Tolerance: Patient limited by pain Patient left: in chair;with call bell/phone within reach;with nursing/sitter in room  OT Visit Diagnosis: Unsteadiness on feet (R26.81);Muscle weakness (generalized) (M62.81)                Time: 8295-6213 OT Time Calculation (min): 27 min Charges:  OT General Charges $OT Visit: 1 Visit OT Evaluation $OT Eval Moderate Complexity: 1 Mod OT Treatments $Therapeutic Activity: 8-22 mins G-Codes:        Jae Dire Shamariah Shewmake 04/07/2017, 1:29 PM

## 2017-04-07 NOTE — Progress Notes (Addendum)
PROGRESS NOTE    Victor Hayden  KYH:[id number] DOB: 05-14-1970 DOA: 03/30/2017 PCP: Mackie Pai, PA-C   Brief Narrative: 46 year old male with history of rapidly worsening widespread classic Kaposi's sarcoma presenting with worsening chest pain, shortness of breath, anasarca.  CT angiogram negative for PE.  Assessment & Plan:  #Dyspnea associated with chest pain: CT angiogram, VQ scan low probability for PE.  Echocardiogram with EF within normal limit.  PE ruled out. -Pleural effusion cytology consistent with lymphoma.  Patient has persistent pleural effusion.  Pulmonary consult appreciated.  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  Pulmonologist recommended Pleurx drain placement if patient has recurrent accumulation of pleural effusion for symptom management of his dyspnea.  Now palliative care following for possible comfort care. -No shortness of breath today. -Today's x-ray result reviewed, clinically do not think patient has pneumonia.  Patient will likely go for comfort care.  #Bilateral lower extremities edema/scrotal edema:  -Continue IV Lasix.  No improvement in the edema.  #Acute kidney injury likely due to obstruction,? contrast associated : Status post stent placement.  Continue IV Lasix.  Avoid nephrotoxins.  Patient is nonoliguric.  Serum creatinine level is stable.  #Hyponatremia: Serum sodium level 126.  On diuretics for the management of edema.  Minimize fluid intake.  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  Oncology follow-up ongoing.  May consider chemo after discharge.  Palliative care evaluation ongoing.  Continue supportive care and pain management.  #Cancer pain: Continue current pain management.   -Patient is on MS Contin 15 mg twice a day and Dilaudid as needed with stool softener.   Palliative care following for the pain management.  #Left hydroureteronephrosis: Status post a stent placement by urology today.  Urologist recommended to continue Foley catheter for a month and outpatient follow-up.  #Thrombocytopenia in the setting of chemotherapy and malignancy: Monitor platelet count.  No sign of bleeding.  Platelet count continue to drop.  Blood rate 52 today.  #Anemia in the setting of chemo and malignancy/iron deficiency anemia: Received  2 units of red blood cell transfusion on 12/6.   -received  a dose of Fereheme for iron deficiency. -Hemoglobin 8.8 today.  #Failure to thrive and generalized weakness with overall poor prognosis.   -Patient with poor prognosis with no improvement in clinical condition.  Palliative care is following and planning for meeting today.  I believe patient will benefit from hospice care and comfort measures.  I have discussed his illness and poor prognosis with the patient.  He was appropriately sad and understand.  He is willing to talk to palliative care and agrees for comfort care only.   DVT prophylaxis:  SCD Code Status: DNR Family Communication: No family at bedside. Disposition Plan: Inpatient    Consultants:   Oncology  Palliative care  Urologist  Procedures: Ureteral stent placement, thoracocentesis Antimicrobials: None  Subjective: Seen and examined at bedside.  Denies shortness of breath.  Reports generalized weakness and body ache.  No nausea vomiting. Objective: Vitals:   04/06/17 1529 04/06/17 2013 04/06/17 2035 04/07/17 0546  BP: 100/68  (!) 99/55 97/60  Pulse: (!) 107  (!) 118 (!) 112  Resp: 18  16 16   Temp: 99.6 F (37.6 C)  99.5 F (37.5 C) 98 F (36.7 C)  TempSrc: Oral  Oral Oral  SpO2: 96% 93% 95% 96%  Weight:  Height:        Intake/Output Summary (Last 24 hours) at 04/07/2017 1052 Last data filed at 04/07/2017 4259 Gross per 24 hour  Intake 1235 ml  Output 1950 ml  Net -715 ml   Filed  Weights   03/30/17 1925  Weight: 93 kg (205 lb)    Examination:  General exam: Ill looking male, lying in bed Respiratory system: Bibasilar decreased breath sound, respiratory effort normal l Cardiovascular system: Regular rate rhythm, S1-S2 normal.  Bilateral lower extremities pitting edema no significant improvement. Gastrointestinal system: Abdomen is soft and nontender.  Bowel sounds positive Central nervous system: Alert awake and following commands Extremities: Consistent with Kaposi's sarcoma and edema Psychiatry: Judgement and insight appear normal. Mood & affect appropriate.  GU: Scrotal edema ++.  Unchanged and has a Foley catheter.   Data Reviewed: I have personally reviewed following labs and imaging studies  CBC: Recent Labs  Lab 04/03/17 0356 04/04/17 0405 04/05/17 0331 04/06/17 0359 04/07/17 0404  WBC 3.7* 5.0 4.7 4.6 6.2  NEUTROABS  --  3.0  --   --   --   HGB 7.3* 7.5* 6.6* 8.4* 8.8*  HCT 22.1* 22.6* 20.1* 24.9* 26.3*  MCV 83.4 83.7 83.8 83.0 83.5  PLT 65* 71* 68* 58* 52*   Basic Metabolic Panel: Recent Labs  Lab 04/01/17 0153  04/03/17 0356 04/04/17 0405 04/05/17 0331 04/06/17 0359 04/07/17 0404  NA 129*   < > 128* 127* 127* 128* 126*  K 3.9   < > 3.8 4.1 4.1 3.3* 3.9  CL 98*   < > 98* 97* 96* 96* 91*  CO2 23   < > 25 24 27 26 26   GLUCOSE 131*   < > 106* 126* 128* 139* 123*  BUN 22*   < > 26* 27* 27* 29* 34*  CREATININE 1.09   < > 1.49* 1.51* 1.36* 1.35* 1.31*  CALCIUM 7.5*   < > 7.4* 7.5* 7.7* 7.7* 7.7*  MG 1.8  --   --   --   --   --   --   PHOS  --   --   --   --  4.1 4.3 4.9*   < > = values in this interval not displayed.   GFR: Estimated Creatinine Clearance: 79 mL/min (A) (by C-G formula based on SCr of 1.31 mg/dL (H)). Liver Function Tests: Recent Labs  Lab 04/03/17 1143 04/05/17 0331 04/06/17 0359 04/07/17 0404  PROT 4.9*  --   --   --   ALBUMIN  --  2.1* 2.3* 2.2*   No results for input(s): LIPASE, AMYLASE in the last 168  hours. No results for input(s): AMMONIA in the last 168 hours. Coagulation Profile: No results for input(s): INR, PROTIME in the last 168 hours. Cardiac Enzymes: No results for input(s): CKTOTAL, CKMB, CKMBINDEX, TROPONINI in the last 168 hours. BNP (last 3 results) Recent Labs    03/01/17 1036  PROBNP 10.0   HbA1C: No results for input(s): HGBA1C in the last 72 hours. CBG: No results for input(s): GLUCAP in the last 168 hours. Lipid Profile: No results for input(s): CHOL, HDL, LDLCALC, TRIG, CHOLHDL, LDLDIRECT in the last 72 hours. Thyroid Function Tests: No results for input(s): TSH, T4TOTAL, FREET4, T3FREE, THYROIDAB in the last 72 hours. Anemia Panel: No results for input(s): VITAMINB12, FOLATE, FERRITIN, TIBC, IRON, RETICCTPCT in the last 72 hours. Sepsis Labs: No results for input(s): PROCALCITON, LATICACIDVEN in the last 168 hours.  Recent Results (from the past  240 hour(s))  Urine Culture     Status: None   Collection Time: 03/30/17 11:33 PM  Result Value Ref Range Status   Specimen Description URINE, CLEAN CATCH  Final   Special Requests Normal  Final   Culture   Final    NO GROWTH Performed at [facility], [address] [place], [place] [address]    Report Status 04/01/2017 FINAL  Final  Body fluid culture     Status: None   Collection Time: 04/03/17 12:06 PM  Result Value Ref Range Status   Specimen Description PLEURAL  Final   Special Requests NONE  Final   Gram Stain   Final    ABUNDANT WBC PRESENT, PREDOMINANTLY MONONUCLEAR NO ORGANISMS SEEN    Culture   Final    NO GROWTH 3 DAYS Performed at [facility], [place] [address] [place], [place] [address]    Report Status 04/06/2017 FINAL  Final         Radiology Studies: Dg Chest Port 1 View  Result Date: 04/07/2017 CLINICAL DATA:  Followup pleural effusion. EXAM: PORTABLE CHEST 1 VIEW COMPARISON:  04/03/2017 FINDINGS: Heart size remains normal. Bilateral lower lobe pneumonia and  volume loss persists and may be slightly worsened. Probable small amount of subpulmonic fluid. Upper lungs remain largely clear. No acute bone finding. IMPRESSION: Persistent and possibly slightly worsened bilateral lower lobe pneumonia and volume loss. Electronically Signed   By: Nelson Chimes M.D.   On: 04/07/2017 07:48        Scheduled Meds: . budesonide (PULMICORT) nebulizer solution  0.25 mg Nebulization BID  . docusate sodium  100 mg Oral BID  . furosemide  40 mg Intravenous Q12H  . morphine  15 mg Oral Q12H  . pantoprazole  40 mg Oral Daily  . polyethylene glycol  17 g Oral Daily  . potassium chloride  40 mEq Oral Daily  . senna-docusate  1 tablet Oral BID  . sucralfate  1 g Oral TID WC & HS   Continuous Infusions:    LOS: 8 days    Nasra Counce Tanna Furry, MD [facility] Pager [phone]  If 7PM-7AM, please contact night-coverage [email or web address] Password [email or web address] 04/07/2017, 10:52 AM

## 2017-04-08 NOTE — Progress Notes (Signed)
PROGRESS NOTE    Victor Hayden  DHR:[id number] DOB: [birth year] DOA: 03/30/2017 PCP: Mackie Pai, PA-C   Brief Narrative: 46 year old male with history of rapidly worsening widespread classic Kaposi's sarcoma presenting with worsening chest pain, shortness of breath, anasarca.  CT angiogram negative for PE.  Assessment & Plan:  #Dyspnea associated with chest pain: CT angiogram, VQ scan low probability for PE.  Echocardiogram with EF within normal limit.  PE ruled out. -Pleural effusion cytology consistent with lymphoma.  Patient has persistent pleural effusion.  Pulmonary consult appreciated.  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  Pulmonologist recommended Pleurx drain placement if patient has recurrent accumulation of pleural effusion for symptom management of his dyspnea.  Now palliative care following for  comfort care.  No clinical improvement.  Patient is now on PCA Dilaudid pump but is still complaining of generalized body pain.  He denies shortness of breath.  I have discussed with the patient regarding gradual decline in his clinical condition.  He understands.  Continue current management with more focus on pain management and comfort care.  #Bilateral lower extremities edema/scrotal edema:  -Continue IV Lasix.  No improvement in the edema.  #Acute kidney injury likely due to obstruction,? contrast associated : Status post stent placement.  Continue IV Lasix.  Avoid nephrotoxins.  Serum creatinine level is stable.  Patient is nonoliguric.  #Hyponatremia:  On diuretics for the management of edema.  Minimize fluid intake.  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  Oncology follow-up ongoing.  May consider chemo after discharge.  Palliative care evaluation ongoing.  Continue supportive care and pain  management.  #Cancer pain: Continue current pain management.   -Currently on Dilaudid PCA pump.  Palliative care consult appreciated.  #Left hydroureteronephrosis: Status post a stent placement by urology today.  Urologist recommended to continue Foley catheter for a month and outpatient follow-up. -Patient has large scrotum with edema.  On diuretics.  #Thrombocytopenia in the setting of chemotherapy and malignancy: Monitor platelet count.  No sign of bleeding.  Platelet count continue to drop.  Blood rate 52 today.  #Anemia in the setting of chemo and malignancy/iron deficiency anemia: Received  2 units of red blood cell transfusion on 12/6.   -received  a dose of Fereheme for iron deficiency. -Monitor hemoglobin.  #Failure to thrive and generalized weakness with overall poor prognosis.   -Patient with very poor prognosis and clearly not improving.  He understands his deteriorating clinical condition.  Continue pain management.  He is hospice candidate.  DVT prophylaxis:  SCD Code Status: DNR Family Communication: No family at bedside. Disposition Plan: Inpatient    Consultants:   Oncology  Palliative care  Urologist  Procedures: Ureteral stent placement, thoracocentesis Antimicrobials: None  Subjective: Seen and examined at bedside.  On PCA pump but is still complaining of generalized body pain.  No shortness of breath. Objective: Vitals:   04/07/17 2118 04/08/17 0452 04/08/17 0734 04/08/17 0755  BP:  (!) 90/53    Pulse:  (!) 116    Resp:  16  14  Temp:  98.5 F (36.9 C)    TempSrc:  Oral    SpO2: 96% 96% 99% 99%  Weight:      Height:        Intake/Output Summary (Last 24 hours) at 04/08/2017 1150 Last data filed at 04/08/2017 0453 Gross per 24  hour  Intake 800 ml  Output 700 ml  Net 100 ml   Filed Weights   03/30/17 1925  Weight: 93 kg (205 lb)    Examination:  General exam: Very ill looking male lying in bed. Respiratory system: Bibasilar decreased  breath sound, respiratory effort normal Cardiovascular system: Regular rate rhythm, S1-S2 normal.  Bilateral lower extremities pitting edema which is likely worsen Gastrointestinal system: abdomen is firm, nontender.  Bowel sounds positive  Central nervous system: Alert awake and following commands Extremities: Consistent with Kaposi's sarcoma and edema Psychiatry: Judgement and insight appear normal. Mood & affect appropriate.  GU: Scrotal edema ++.  Probably worsening, has Foley catheter.   Data Reviewed: I have personally reviewed following labs and imaging studies  CBC: Recent Labs  Lab 04/03/17 0356 04/04/17 0405 04/05/17 0331 04/06/17 0359 04/07/17 0404  WBC 3.7* 5.0 4.7 4.6 6.2  NEUTROABS  --  3.0  --   --   --   HGB 7.3* 7.5* 6.6* 8.4* 8.8*  HCT 22.1* 22.6* 20.1* 24.9* 26.3*  MCV 83.4 83.7 83.8 83.0 83.5  PLT 65* 71* 68* 58* 52*   Basic Metabolic Panel: Recent Labs  Lab 04/03/17 0356 04/04/17 0405 04/05/17 0331 04/06/17 0359 04/07/17 0404  NA 128* 127* 127* 128* 126*  K 3.8 4.1 4.1 3.3* 3.9  CL 98* 97* 96* 96* 91*  CO2 25 24 27 26 26   GLUCOSE 106* 126* 128* 139* 123*  BUN 26* 27* 27* 29* 34*  CREATININE 1.49* 1.51* 1.36* 1.35* 1.31*  CALCIUM 7.4* 7.5* 7.7* 7.7* 7.7*  PHOS  --   --  4.1 4.3 4.9*   GFR: Estimated Creatinine Clearance: 79 mL/min (A) (by C-G formula based on SCr of 1.31 mg/dL (H)). Liver Function Tests: Recent Labs  Lab 04/03/17 1143 04/05/17 0331 04/06/17 0359 04/07/17 0404  PROT 4.9*  --   --   --   ALBUMIN  --  2.1* 2.3* 2.2*   No results for input(s): LIPASE, AMYLASE in the last 168 hours. No results for input(s): AMMONIA in the last 168 hours. Coagulation Profile: No results for input(s): INR, PROTIME in the last 168 hours. Cardiac Enzymes: No results for input(s): CKTOTAL, CKMB, CKMBINDEX, TROPONINI in the last 168 hours. BNP (last 3 results) Recent Labs    03/01/17 1036  PROBNP 10.0   HbA1C: No results for input(s):  HGBA1C in the last 72 hours. CBG: No results for input(s): GLUCAP in the last 168 hours. Lipid Profile: No results for input(s): CHOL, HDL, LDLCALC, TRIG, CHOLHDL, LDLDIRECT in the last 72 hours. Thyroid Function Tests: No results for input(s): TSH, T4TOTAL, FREET4, T3FREE, THYROIDAB in the last 72 hours. Anemia Panel: No results for input(s): VITAMINB12, FOLATE, FERRITIN, TIBC, IRON, RETICCTPCT in the last 72 hours. Sepsis Labs: No results for input(s): PROCALCITON, LATICACIDVEN in the last 168 hours.  Recent Results (from the past 240 hour(s))  Urine Culture     Status: None   Collection Time: 03/30/17 11:33 PM  Result Value Ref Range Status   Specimen Description URINE, CLEAN CATCH  Final   Special Requests Normal  Final   Culture   Final    NO GROWTH Performed at [facility], [address] [place], [place] [address]    Report Status 04/01/2017 FINAL  Final  Body fluid culture     Status: None   Collection Time: 04/03/17 12:06 PM  Result Value Ref Range Status   Specimen Description PLEURAL  Final   Special  Requests NONE  Final   Gram Stain   Final    ABUNDANT WBC PRESENT, PREDOMINANTLY MONONUCLEAR NO ORGANISMS SEEN    Culture   Final    NO GROWTH 3 DAYS Performed at [facility], [address], [place], [place] [address]    Report Status 04/06/2017 FINAL  Final         Radiology Studies: Dg Chest Port 1 View  Result Date: 04/07/2017 CLINICAL DATA:  Followup pleural effusion. EXAM: PORTABLE CHEST 1 VIEW COMPARISON:  04/03/2017 FINDINGS: Heart size remains normal. Bilateral lower lobe pneumonia and volume loss persists and may be slightly worsened. Probable small amount of subpulmonic fluid. Upper lungs remain largely clear. No acute bone finding. IMPRESSION: Persistent and possibly slightly worsened bilateral lower lobe pneumonia and volume loss. Electronically Signed   By: Nelson Chimes M.D.   On: 04/07/2017 07:48        Scheduled Meds: .  budesonide (PULMICORT) nebulizer solution  0.25 mg Nebulization BID  . docusate sodium  100 mg Oral BID  . furosemide  40 mg Intravenous Q12H  . HYDROmorphone   Intravenous Q4H  . morphine  15 mg Oral Q12H  . pantoprazole  40 mg Oral Daily  . polyethylene glycol  17 g Oral Daily  . potassium chloride  40 mEq Oral Daily  . senna-docusate  1 tablet Oral BID  . sucralfate  1 g Oral TID WC & HS   Continuous Infusions:    LOS: 9 days    Aariel Ems Tanna Furry, MD [facility] Pager [phone]  If 7PM-7AM, please contact night-coverage [email or web address] Password [email or web address] 04/08/2017, 11:50 AM

## 2017-04-09 DIAGNOSIS — N5089 Other specified disorders of the male genital organs: Secondary | ICD-10-CM

## 2017-04-09 LAB — BASIC METABOLIC PANEL
Anion gap: 8 (ref 5–15)
BUN: 39 mg/dL — AB (ref 6–20)
CALCIUM: 7.5 mg/dL — AB (ref 8.9–10.3)
CO2: 25 mmol/L (ref 22–32)
CREATININE: 1.43 mg/dL — AB (ref 0.61–1.24)
Chloride: 89 mmol/L — ABNORMAL LOW (ref 101–111)
GFR calc Af Amer: >60 mL/min (ref >60)
GFR, EST NON AFRICAN AMERICAN: 57 mL/min — AB (ref >60)
Glucose, Bld: 128 mg/dL — ABNORMAL HIGH (ref 65–99)
Potassium: 4.4 mmol/L (ref 3.5–5.1)
SODIUM: 122 mmol/L — AB (ref 135–145)

## 2017-04-09 LAB — CBC
HCT: 24 % — ABNORMAL LOW (ref 39.0–52.0)
Hemoglobin: 8 g/dL — ABNORMAL LOW (ref 13.0–17.0)
MCH: 28.1 pg (ref 26.0–34.0)
MCHC: 33.3 g/dL (ref 30.0–36.0)
MCV: 84.2 fL (ref 78.0–100.0)
PLATELETS: 50 10*3/uL — AB (ref 150–400)
RBC: 2.85 MIL/uL — ABNORMAL LOW (ref 4.22–5.81)
RDW: 17.4 % — ABNORMAL HIGH (ref 11.5–15.5)
WBC: 7.6 10*3/uL (ref 4.0–10.5)

## 2017-04-09 MED ORDER — FENTANYL 25 MCG/HR TD PT72
25.0000 ug | MEDICATED_PATCH | TRANSDERMAL | Status: DC
  Administered 2017-04-09: 25 ug via TRANSDERMAL
  Filled 2017-04-09: qty 1

## 2017-04-09 MED ORDER — FLUCONAZOLE 100 MG PO TABS
200.0000 mg | ORAL_TABLET | Freq: Once | ORAL | Status: AC
  Administered 2017-04-09: 200 mg via ORAL
  Filled 2017-04-09: qty 2

## 2017-04-09 MED ORDER — FLUCONAZOLE 100 MG PO TABS
100.0000 mg | ORAL_TABLET | Freq: Every day | ORAL | Status: DC
  Administered 2017-04-10 – 2017-04-11 (×2): 100 mg via ORAL
  Filled 2017-04-09 (×3): qty 1

## 2017-04-09 MED ORDER — MAGIC MOUTHWASH
10.0000 mL | Freq: Three times a day (TID) | ORAL | Status: DC
  Administered 2017-04-09 – 2017-04-11 (×7): 10 mL via ORAL
  Filled 2017-04-09 (×11): qty 10

## 2017-04-09 NOTE — Progress Notes (Signed)
Events noted in the last few days.  He appears to be more comfortable and pain is controlled.  He is eating better without any distress this morning.  I answered any questions he has about his prognosis and he is okay with his decision to proceed with comfort care.    I appreciate the input from palliative medicine service as well as pulmonary medicine.  I appreciate the care of the hospitalist team.

## 2017-04-09 NOTE — Progress Notes (Signed)
[facility] Liaison: RN visit  Notified by Marney Doctor, [facility] of patient/family request for [facility] services at home after discharge. Chart and patient information under review by [facility] physician. Hospice eligibility pending at this time.  Writer spoke with patient's mother, Victor Hayden,  by phone to initiate education related to hospice philosophy, services and team approach to care. Victor Hayden  verbalized understanding of information given. Per discussion, plan is for discharge to home by [facility]  Wednesday 04/11/2017.  Please send signed and completed DNR form home with patient/family. Patient will need prescriptions for discharge comfort medications.  DME needs have been discussed, patient currently has the following equipment in the home: none.  Patient/family requests the following DME for delivery to the home:  Hospital Bed, OBT, 3N1, walker, Oxygen PRN. [facility] equipment manager has been notified and will contact [place] to arrange delivery to the home. Home address has been verified and is correct in the chart. Victor Hayden is the family member to contact to arrange time of delivery.  [facility]erral [facility] aware of the above. Please notify [facility] when patient is ready to leave the unit at discharge. (Call [phone] or [phone] after 5pm.) [facility] information and contact numbers given to Victor Hayden at time of visit. Above information shared with Marney Doctor CMRN.  Please call with any hospice related questions.  Thank you for this referral.  Farrel Gordon, RN, [facility] Liaison [phone] ? Hospital liaisons are now on [place].

## 2017-04-09 NOTE — Care Management Note (Signed)
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 CM consult for home with hospice. This CM spoke with pt mother for choice on hospice agencies and [facility] chosen. [facility] rep alerted of referral. Request made for [facility] and hospital bed. Unsure if pt to go home on CADD pump for pain or to be switched to orals at this time. CM will continue to follow and assist with DC as needed.  Expected Discharge Date:  (unknown)               Expected Discharge Plan:  Home w Hospice Care  In-House Referral:     Discharge planning Services  CM Consult  Post Acute Care Choice:  Hospice Choice offered to:  Parent  DME Arranged:    DME Agency:     HH Arranged:  Disease Management [place] Agency:  [facility] [place]  Status of Service:  In process, will continue to follow  If discussed at Long Length of Stay Meetings, dates discussed:    Additional Comments:  Lynnell Catalan, RN 04/09/2017, 1:29 PM

## 2017-04-09 NOTE — Progress Notes (Signed)
Pharmacy Antibiotic Note  Victor Hayden is a 46 y.o. male admitted on 03/30/2017 with oral and esophageal thrush.  Pharmacy has been consulted for fluconazole dosing.  Plan: Fluconazole 200 mg x 1 then 100 mg PO daily.  Height: 5\' 10"  (177.8 cm) Weight: 205 lb (93 kg) IBW/kg (Calculated) : 73  Temp (24hrs), Avg:98.7 F (37.1 C), Min:98.1 F (36.7 C), Max:99.3 F (37.4 C)  Recent Labs  Lab 04/04/17 0405 04/05/17 0331 04/06/17 0359 04/07/17 0404 04/09/17 0348  WBC 5.0 4.7 4.6 6.2 7.6  CREATININE 1.51* 1.36* 1.35* 1.31* 1.43*    Estimated Creatinine Clearance: 72.4 mL/min (A) (by C-G formula based on SCr of 1.43 mg/dL (H)).    Allergies  Allergen Reactions  . Bee Venom Anaphylaxis    Thank you for allowing pharmacy to be a part of this patient's care.  Hershal Coria 04/09/2017 3:26 PM

## 2017-04-09 NOTE — Progress Notes (Signed)
Parents in to visit, they spoke with Dr. Hilma Favors

## 2017-04-09 NOTE — Progress Notes (Signed)
Called to patient's room by his mother- multiple questions answered and he has significant issues that need to be addressed prior to discharge home with hospice.  1. Pleurx catheter- patient feels his effusion is back- wants this done prior to leaving.  2. Used 6.6 mg of dilaudid total 24 hours- has peripheral IV- will transition this to basal fentanyl transdermal and leave PRN dilaudid via pump until tomorrow- can probably get him off IV.  3. He has bad thrush in his mouth. I will treat with diflucan and MMW.  Lane Hacker, DO Palliative Medicine

## 2017-04-09 NOTE — Progress Notes (Signed)
PROGRESS NOTE    Victor Hayden  PQZ:[id number] DOB: 26-Jun-1970 DOA: 03/30/2017 PCP: Mackie Pai, PA-C   Brief Narrative: 46 year old male with history of rapidly worsening widespread classic Kaposi's sarcoma presenting with worsening chest pain, shortness of breath, anasarca.  CT angiogram negative for PE.  Assessment & Plan:  #Dyspnea associated with chest pain: CT angiogram, VQ scan low probability for PE.  Echocardiogram with EF within normal limit.  PE ruled out. -Pleural effusion cytology consistent with lymphoma.  Patient has persistent pleural effusion.  Pulmonary consult appreciated.  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  Pulmonologist recommended Pleurx drain placement if patient has recurrent accumulation of pleural effusion for symptom management of his dyspnea.  Now palliative care following for  comfort care.  No clinical improvement.  Patient is now on PCA Dilaudid pump for the pain management.  Focus on comfort measures.  No further invasive test or lab test.  I discussed with the patient and he verbalized understanding.  Palliative care following.  #Bilateral lower extremities edema/scrotal edema:  -Continue IV Lasix.  No improvement in the edema.  #Acute kidney injury likely due to obstruction,? contrast associated : Status post stent placement.  Continue IV Lasix.  Avoid nephrotoxins.  Serum creatinine level is stable.  Patient is nonoliguric.  #Hyponatremia/ hypokalemia.: Hyponatremia worsen, patient has poor nutrition.  On diuretics for the management of edema.  No further lab monitoring.  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  Oncology follow-up ongoing.  Comfort measures and pain management.  #Cancer pain: Continue current pain management.   -Currently on Dilaudid PCA pump.  Palliative care consult appreciated.  #Left hydroureteronephrosis:  Status post a stent placement by urology today.  Urologist recommended to continue Foley catheter for a month and outpatient follow-up. -Patient has large scrotum with edema.  On diuretics.  #Thrombocytopenia in the setting of chemotherapy and malignancy: Monitor platelet count.  No sign of bleeding.    #Anemia in the setting of chemo and malignancy/iron deficiency anemia: Received  2 units of red blood cell transfusion on 12/6.   -received  a dose of Fereheme for iron deficiency. -Monitor hemoglobin.  #Failure to thrive and generalized weakness with overall poor prognosis.    Patient with no clinical improvement.  Poor prognosis with the metastatic disease.  I believe he will benefit from hospice care.  DVT prophylaxis:  SCD Code Status: DNR Family Communication: No family at bedside. Disposition Plan: Inpatient    Consultants:   Oncology  Palliative care  Urologist  Procedures: Ureteral stent placement, thoracocentesis Antimicrobials: None  Subjective: Seen and examined at bedside.  On PCA for pain management.  He was alert awake and following commands.  Understand his illness and poor prognosis.  He is okay with pain management and comfort care only.  Objective: Vitals:   04/08/17 2103 04/08/17 2205 04/09/17 0455 04/09/17 0750  BP:  (!) 99/51 (!) 93/56   Pulse:  (!) 122 (!) 113   Resp:  16 16   Temp:  99.3 F (37.4 C) 98.1 F (36.7 C)   TempSrc:  Oral Oral   SpO2: 97% 99% 96% 99%  Weight:      Height:        Intake/Output Summary (Last 24 hours) at 04/09/2017 1218 Last data filed at 04/09/2017 0500 Gross per 24 hour  Intake 129.8 ml  Output 800 ml  Net -670.2 ml   [employer]  03/30/17 1925  Weight: 93 kg (205 lb)    Examination:  General exam: Ill looking male, looks comfortable Respiratory system: Bibasilar decreased breath sound, respiratory effort normal Cardiovascular system: Regular rate rhythm S1-S2 normal.  Bilateral lower extremity  pitting edema worsening Gastrointestinal system: abdomen is firm, nontender.  Bowel sounds positive  Central nervous system: Alert awake and following commands Extremities: Consistent with Kaposi's sarcoma and edema Psychiatry: judgment and insight appear normal.  Mood and affect appropriate to his current illness. GU: Scrotal and penile edema worsening, has Foley catheter.  Data Reviewed: I have personally reviewed following labs and imaging studies  CBC: Recent Labs  Lab 04/04/17 0405 04/05/17 0331 04/06/17 0359 04/07/17 0404 04/09/17 0348  WBC 5.0 4.7 4.6 6.2 7.6  NEUTROABS 3.0  --   --   --   --   HGB 7.5* 6.6* 8.4* 8.8* 8.0*  HCT 22.6* 20.1* 24.9* 26.3* 24.0*  MCV 83.7 83.8 83.0 83.5 84.2  PLT 71* 68* 58* 52* 50*   Basic Metabolic Panel: Recent Labs  Lab 04/04/17 0405 04/05/17 0331 04/06/17 0359 04/07/17 0404 04/09/17 0348  NA 127* 127* 128* 126* 122*  K 4.1 4.1 3.3* 3.9 4.4  CL 97* 96* 96* 91* 89*  CO2 24 27 26 26 25   GLUCOSE 126* 128* 139* 123* 128*  BUN 27* 27* 29* 34* 39*  CREATININE 1.51* 1.36* 1.35* 1.31* 1.43*  CALCIUM 7.5* 7.7* 7.7* 7.7* 7.5*  PHOS  --  4.1 4.3 4.9*  --    GFR: Estimated Creatinine Clearance: 72.4 mL/min (A) (by C-G formula based on SCr of 1.43 mg/dL (H)). Liver Function Tests: Recent Labs  Lab 04/03/17 1143 04/05/17 0331 04/06/17 0359 04/07/17 0404  PROT 4.9*  --   --   --   ALBUMIN  --  2.1* 2.3* 2.2*   No results for input(s): LIPASE, AMYLASE in the last 168 hours. No results for input(s): AMMONIA in the last 168 hours. Coagulation Profile: No results for input(s): INR, PROTIME in the last 168 hours. Cardiac Enzymes: No results for input(s): CKTOTAL, CKMB, CKMBINDEX, TROPONINI in the last 168 hours. BNP (last 3 results) Recent Labs    03/01/17 1036  PROBNP 10.0   HbA1C: No results for input(s): HGBA1C in the last 72 hours. CBG: No results for input(s): GLUCAP in the last 168 hours. Lipid Profile: No results for  input(s): CHOL, HDL, LDLCALC, TRIG, CHOLHDL, LDLDIRECT in the last 72 hours. Thyroid Function Tests: No results for input(s): TSH, T4TOTAL, FREET4, T3FREE, THYROIDAB in the last 72 hours. Anemia Panel: No results for input(s): VITAMINB12, FOLATE, FERRITIN, TIBC, IRON, RETICCTPCT in the last 72 hours. Sepsis Labs: No results for input(s): PROCALCITON, LATICACIDVEN in the last 168 hours.  Recent Results (from the past 240 hour(s))  Urine Culture     Status: None   Collection Time: 03/30/17 11:33 PM  Result Value Ref Range Status   Specimen Description URINE, CLEAN CATCH  Final   Special Requests Normal  Final   Culture   Final    NO GROWTH Performed at [facility], [address], [place], [place] [address]    Report Status 04/01/2017 FINAL  Final  Body fluid culture     Status: None   Collection Time: 04/03/17 12:06 PM  Result Value Ref Range Status   Specimen Description PLEURAL  Final   Special Requests NONE  Final   Gram Stain   Final    ABUNDANT WBC PRESENT, PREDOMINANTLY MONONUCLEAR NO ORGANISMS SEEN  Culture   Final    NO GROWTH 3 DAYS Performed at [facility], [address], [place],  [address]    Report Status 04/06/2017 FINAL  Final         Radiology Studies: No results found.      Scheduled Meds: . budesonide (PULMICORT) nebulizer solution  0.25 mg Nebulization BID  . docusate sodium  100 mg Oral BID  . furosemide  40 mg Intravenous Q12H  . HYDROmorphone   Intravenous Q4H  . morphine  15 mg Oral Q12H  . pantoprazole  40 mg Oral Daily  . polyethylene glycol  17 g Oral Daily  . potassium chloride  40 mEq Oral Daily  . senna-docusate  1 tablet Oral BID  . sucralfate  1 g Oral TID WC & HS   Continuous Infusions:    LOS: 10 days    Chamika Cunanan Tanna Furry, MD [facility] Pager [phone]  If 7PM-7AM, please contact night-coverage [email or web address] Password [email or web address] 04/09/2017, 12:18 PM

## 2017-04-09 NOTE — Progress Notes (Signed)
PT Cancellation Note  Patient Details Name: Angeldejesus Callaham Deal MRN: [id number] DOB: 09/29/1970   Cancelled Treatment:     Pt unable to participate today per RN.  Palliative meeting today.   Rica Koyanagi  PTA [facility]  Acute  Rehab Pager      [phone]

## 2017-04-10 ENCOUNTER — Telehealth: Payer: Self-pay | Admitting: Medical Oncology

## 2017-04-10 ENCOUNTER — Inpatient Hospital Stay (HOSPITAL_COMMUNITY): Payer: Medicaid Other

## 2017-04-10 ENCOUNTER — Ambulatory Visit: Payer: Medicaid Other | Admitting: Adult Health

## 2017-04-10 DIAGNOSIS — J910 Malignant pleural effusion: Secondary | ICD-10-CM

## 2017-04-10 MED ORDER — ALBUMIN HUMAN 25 % IV SOLN
12.5000 g | Freq: Four times a day (QID) | INTRAVENOUS | Status: DC
  Administered 2017-04-10 – 2017-04-12 (×8): 12.5 g via INTRAVENOUS
  Filled 2017-04-10 (×11): qty 50

## 2017-04-10 MED ORDER — ALPRAZOLAM 0.5 MG PO TABS
0.5000 mg | ORAL_TABLET | Freq: Two times a day (BID) | ORAL | Status: DC | PRN
  Administered 2017-04-10 – 2017-04-11 (×2): 0.5 mg via ORAL
  Filled 2017-04-10 (×2): qty 1

## 2017-04-10 MED ORDER — METOCLOPRAMIDE HCL 5 MG/ML IJ SOLN
10.0000 mg | Freq: Once | INTRAMUSCULAR | Status: AC
  Administered 2017-04-10: 10 mg via INTRAVENOUS
  Filled 2017-04-10: qty 2

## 2017-04-10 NOTE — Progress Notes (Signed)
[facility] Liaison: RN visit  Spoke with patient and mother, Victor Hayden at bedside to discuss hospice service and answer questions.  Per discussion, plan is for discharge to home by [facility] when medically appropriate.  Patient scheduled to have pleurx drain placed today.  Please send signed and completed DNR form home with patient/family. Patient will need prescriptions for discharge comfort medications.  DME needs have been discussed, patient currently has the following equipment in the home: none.  Patient/family requests the following DME for delivery to the home:  Hospital Bed, OBT, 3N1, walker, Oxygen PRN. [facility] equipment manager has been notified and will contact [place] to arrange delivery to the home. Home address has been verified and is correct in the chart. Victor Hayden is the family member to contact to arrange time of delivery.  [facility] Referral Center aware of the above. Please notify [facility] when patient is ready to leave the unit at discharge. (Call [phone] or [phone] after 5pm.) [facility] information and contact numbers given to Victor Hayden at time of visit.  Please call with any hospice related questions.  Thank you,  Victor Starr RN, [facility] Liaison [phone] ? Hospital liaisons are now on [place].

## 2017-04-10 NOTE — Progress Notes (Signed)
OT Cancellation Note  Patient Details Name: Victor Hayden MRN: [id number] DOB: 1970-07-01   Cancelled Treatment:    Reason Eval/Treat Not Completed: Other (comment) - Notes indicate pt going home with hospice care and that focus is on comfort care. OT will sign off.  Estanislado Surgeon A Jazon Jipson 04/10/2017, 1:45 PM

## 2017-04-10 NOTE — Progress Notes (Signed)
Request received for left Pleurx catheter placement in patient.  Chest x-ray today shows no significant pleural effusions.  Moderate amount of pleural fluid necessary in order to place a tunneled Pleurx catheter in patient.  Above discussed with Dr. Hilma Favors.

## 2017-04-10 NOTE — Progress Notes (Signed)
PROGRESS NOTE    Victor Hayden  PYP:[id number] DOB: 28-Nov-1970 DOA: 03/30/2017 PCP: Mackie Pai, PA-C   Brief Narrative: 46 year old male with history of rapidly worsening widespread classic Kaposi's sarcoma presenting with worsening chest pain, shortness of breath, anasarca.  CT angiogram negative for PE.  Assessment & Plan:  #Dyspnea associated with chest pain: CT angiogram, VQ scan low probability for PE.  Echocardiogram with EF within normal limit.  PE ruled out. -Pleural effusion cytology consistent with lymphoma.  Patient has persistent pleural effusion.  Pulmonary consult appreciated.  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  -Plan for Pleurx catheter by IR as a symptomatic management for dyspnea, palliation treatment.  Discussed with Dr. Hilma Favors.  #Bilateral lower extremities edema/scrotal edema:  -Patient was treated with IV diuretics and albumin with no clinical improvement.  Discussed about scrotal elevation and support to elevate the pressure.  #Acute kidney injury likely due to obstruction,? contrast associated : Status post stent placement.  Currently on IV Lasix.  Serum creatinine level is stable.   #Hyponatremia/ hypokalemia.:  Serum sodium level dropped since patient has low albumin.  On IV Lasix.  Patient is mainly for comfort care therefore we will not further check lab.  Palliative care following, benefit from hospice care.  Patient with poor prognosis.  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  Oncology follow-up ongoing.  Comfort measures and pain management.  #Cancer pain: Continue current pain management.   -Currently on Dilaudid PCA pump.  Palliative care consult appreciated.  #Left hydroureteronephrosis: Status post a stent placement by urology today.  Urologist recommended to continue Foley catheter for a month and outpatient follow-up. -Patient  has large scrotum with edema.  Scrotal support and IV Lasix, no improvement.  #Thrombocytopenia in the setting of chemotherapy and malignancy: Monitor platelet count.  No sign of bleeding.    #Anemia in the setting of chemo and malignancy/iron deficiency anemia: Received  2 units of red blood cell transfusion on 12/6.   -received  a dose of Fereheme for iron deficiency.  #Failure to thrive and generalized weakness with overall poor prognosis.    Patient with no clinical improvement.  Currently on Dilaudid PCA pump for the pain management in addition to fentanyl patch.  He still has some discomfort.  Added Xanax as needed for the management of his anxiety.  I believe patient will benefit from comfort measures only and hospice care.  He is DNR/DNI.  He has very poor prognosis.   DVT prophylaxis:  SCD Code Status: DNR Family Communication: No family at bedside. Disposition Plan: Inpatient    Consultants:   Oncology  Palliative care  Urologist  Procedures: Ureteral stent placement, thoracocentesis Antimicrobials: None  Subjective: Seen and examined at bedside.  Pain is not completely controlled.  Denied nausea vomiting.  Has some right-sided chest pain.  No dyspnea while lying on bed.  Objective: Vitals:   04/09/17 2006 04/10/17 0408 04/10/17 0449 04/10/17 0928  BP:   (!) 96/54   Pulse:   (!) 112 (!) 110  Resp:  18 18 18   Temp:   98.7 F (37.1 C)   TempSrc:   Oral   SpO2: 93%  96% 95%  Weight:      Height:        Intake/Output Summary (Last 24 hours) at 04/10/2017 1411 Last data filed at 04/10/2017 1000 Gross per 24 hour  Intake 520 ml  Output 1300  ml  Net -780 ml   Filed Weights   03/30/17 1925  Weight: 93 kg (205 lb)    Examination:  General exam: Ill looking male, lying in bed Respiratory system: Bibasilar decreased breath sound, respiratory for normal Cardiovascular system: Regular rate rhythm S1-S2 normal.  Bilateral lower extremities pitting edema  worsening. Gastrointestinal system: abdomen is firm, nontender.  Bowel sounds positive  Central nervous system: Alert awake and following commands Extremities: Consistent with Kaposi's sarcoma and edema Psychiatry: judgment and insight appear normal.  Mood and affect appropriate to his current illness. GU: Scrotal and penile edema worsening, has Foley catheter.  Unchanged  Data Reviewed: I have personally reviewed following labs and imaging studies  CBC: Recent Labs  Lab 04/04/17 0405 04/05/17 0331 04/06/17 0359 04/07/17 0404 04/09/17 0348  WBC 5.0 4.7 4.6 6.2 7.6  NEUTROABS 3.0  --   --   --   --   HGB 7.5* 6.6* 8.4* 8.8* 8.0*  HCT 22.6* 20.1* 24.9* 26.3* 24.0*  MCV 83.7 83.8 83.0 83.5 84.2  PLT 71* 68* 58* 52* 50*   Basic Metabolic Panel: Recent Labs  Lab 04/04/17 0405 04/05/17 0331 04/06/17 0359 04/07/17 0404 04/09/17 0348  NA 127* 127* 128* 126* 122*  K 4.1 4.1 3.3* 3.9 4.4  CL 97* 96* 96* 91* 89*  CO2 24 27 26 26 25   GLUCOSE 126* 128* 139* 123* 128*  BUN 27* 27* 29* 34* 39*  CREATININE 1.51* 1.36* 1.35* 1.31* 1.43*  CALCIUM 7.5* 7.7* 7.7* 7.7* 7.5*  PHOS  --  4.1 4.3 4.9*  --    GFR: Estimated Creatinine Clearance: 72.4 mL/min (A) (by C-G formula based on SCr of 1.43 mg/dL (H)). Liver Function Tests: Recent Labs  Lab 04/05/17 0331 04/06/17 0359 04/07/17 0404  ALBUMIN 2.1* 2.3* 2.2*   No results for input(s): LIPASE, AMYLASE in the last 168 hours. No results for input(s): AMMONIA in the last 168 hours. Coagulation Profile: No results for input(s): INR, PROTIME in the last 168 hours. Cardiac Enzymes: No results for input(s): CKTOTAL, CKMB, CKMBINDEX, TROPONINI in the last 168 hours. BNP (last 3 results) Recent Labs    03/01/17 1036  PROBNP 10.0   HbA1C: No results for input(s): HGBA1C in the last 72 hours. CBG: No results for input(s): GLUCAP in the last 168 hours. Lipid Profile: No results for input(s): CHOL, HDL, LDLCALC, TRIG, CHOLHDL,  LDLDIRECT in the last 72 hours. Thyroid Function Tests: No results for input(s): TSH, T4TOTAL, FREET4, T3FREE, THYROIDAB in the last 72 hours. Anemia Panel: No results for input(s): VITAMINB12, FOLATE, FERRITIN, TIBC, IRON, RETICCTPCT in the last 72 hours. Sepsis Labs: No results for input(s): PROCALCITON, LATICACIDVEN in the last 168 hours.  Recent Results (from the past 240 hour(s))  Body fluid culture     Status: None   Collection Time: 04/03/17 12:06 PM  Result Value Ref Range Status   Specimen Description PLEURAL  Final   Special Requests NONE  Final   Gram Stain   Final    ABUNDANT WBC PRESENT, PREDOMINANTLY MONONUCLEAR NO ORGANISMS SEEN    Culture   Final    NO GROWTH 3 DAYS Performed at [facility], [address], [place], [place] [address]    Report Status 04/06/2017 FINAL  Final         Radiology Studies: Dg Chest Port 1 View  Result Date: 04/10/2017 CLINICAL DATA:  Short of breath, history of recurrent pleural effusion EXAM: PORTABLE CHEST 1 VIEW COMPARISON:  Portable  chest x-ray of 04/07/2017, and CT chest of 03/30/2017 FINDINGS: There has been interval improvement in the lower lobe, right middle lobe, and lingular pneumonia described on prior CT of the chest. There may be very small pleural effusions present but no significant pleural effusion is evident. Slightly more prominent interstitial markings are present in the perihilar regions left-greater-than-right and mild fluid overload/mild edema cannot be excluded. Correlate clinically. Mediastinal and hilar contours are unremarkable and mild cardiomegaly is stable. IMPRESSION: 1. Improvement in pneumonia at the lung bases as described above. 2. Cannot exclude small pleural effusions but no significant pleural effusion is evident. 3. Somewhat more prominent interstitial markings primarily perihilar left-greater-than-right. Question mild fluid overload/edema. Electronically Signed   By: Ivar Drape M.D.   On:  04/10/2017 13:48        Scheduled Meds: . budesonide (PULMICORT) nebulizer solution  0.25 mg Nebulization BID  . docusate sodium  100 mg Oral BID  . fentaNYL  25 mcg Transdermal Q72H  . fluconazole  100 mg Oral Daily  . furosemide  40 mg Intravenous Q12H  . HYDROmorphone   Intravenous Q4H  . magic mouthwash  10 mL Oral TID  . pantoprazole  40 mg Oral Daily  . polyethylene glycol  17 g Oral Daily  . potassium chloride  40 mEq Oral Daily  . senna-docusate  1 tablet Oral BID  . sucralfate  1 g Oral TID WC & HS   Continuous Infusions: . albumin human       LOS: 11 days    Shadara Lopez Tanna Furry, MD [facility] Pager [phone]  If 7PM-7AM, please contact night-coverage [email or web address] Password [email or web address] 04/10/2017, 2:11 PM

## 2017-04-10 NOTE — Telephone Encounter (Signed)
Received call from [place] asking for referral. Order from Dr. Alen Blew to admit to Hospice.

## 2017-04-11 DIAGNOSIS — Z515 Encounter for palliative care: Secondary | ICD-10-CM

## 2017-04-11 NOTE — Progress Notes (Signed)
[facility] PROGRESS NOTE  Victor Hayden KGM:[id number] DOB: Jul 28, 1970 DOA: 03/30/2017  PCP: Mackie Pai, PA-C  Brief History/Interval Summary: 46 year old Caucasian male with past medical history of rapidly worsening widespread Kaposi sarcoma presented with shortness of breath, chest pain, anasarca.  CT angiogram negative for PE.  Pleural effusion was noted.  Patient underwent thoracentesis.  Palliative medicine was consulted.  Pain control.  Reason for Visit: Cancer associated pain  Consultants: Oncology.  Palliative medicine.  Urology.  Interventional radiology.  Procedures:  Thoracentesis  Ureteral stent placement  Antibiotics: None  Subjective/Interval History: Patient complains of pain in his scrotal area due to swelling.  ROS: Denies any nausea or vomiting  Objective:  Vital Signs  Vitals:   04/10/17 1351 04/10/17 2015 04/11/17 0450 04/11/17 0837  BP: (!) 83/44 (!) 98/56 (!) 94/54   Pulse: (!) 107 (!) 111 82   Resp: 16 16 20    Temp: 98.7 F (37.1 C) 98.4 F (36.9 C) 98.9 F (37.2 C)   TempSrc: Oral Oral Oral   SpO2: 98% 99% 94% 91%  Weight:      Height:        Intake/Output Summary (Last 24 hours) at 04/11/2017 1509 Last data filed at 04/11/2017 1126 Gross per 24 hour  Intake 720 ml  Output 1250 ml  Net -530 ml   Filed Weights   03/30/17 1925  Weight: 93 kg (205 lb)    General appearance: Frail-appearing white male in no distress Head: Normocephalic, without obvious abnormality, atraumatic Resp: Diminished air entry at the bases.  No crackles wheezing or rhonchi. Cardio: regular rate and rhythm, S1, S2 normal, no murmur, click, rub or gallop GI: soft, non-tender; bowel sounds normal; no masses,  no organomegaly Male genitalia: Swollen scrotum noted.  Tender to palpation Extremities: Edema noted bilateral lower extremities Neurologic: No focal deficits  Lab Results:  Data Reviewed: I have personally reviewed following  labs and imaging studies  CBC: Recent Labs  Lab 04/05/17 0331 04/06/17 0359 04/07/17 0404 04/09/17 0348  WBC 4.7 4.6 6.2 7.6  HGB 6.6* 8.4* 8.8* 8.0*  HCT 20.1* 24.9* 26.3* 24.0*  MCV 83.8 83.0 83.5 84.2  PLT 68* 58* 52* 50*    Basic Metabolic Panel: Recent Labs  Lab 04/05/17 0331 04/06/17 0359 04/07/17 0404 04/09/17 0348  NA 127* 128* 126* 122*  K 4.1 3.3* 3.9 4.4  CL 96* 96* 91* 89*  CO2 27 26 26 25   GLUCOSE 128* 139* 123* 128*  BUN 27* 29* 34* 39*  CREATININE 1.36* 1.35* 1.31* 1.43*  CALCIUM 7.7* 7.7* 7.7* 7.5*  PHOS 4.1 4.3 4.9*  --     GFR: Estimated Creatinine Clearance: 72.4 mL/min (A) (by C-G formula based on SCr of 1.43 mg/dL (H)).  Liver Function Tests: Recent Labs  Lab 04/05/17 0331 04/06/17 0359 04/07/17 0404  ALBUMIN 2.1* 2.3* 2.2*     Recent Results (from the past 240 hour(s))  Body fluid culture     Status: None   Collection Time: 04/03/17 12:06 PM  Result Value Ref Range Status   Specimen Description PLEURAL  Final   Special Requests NONE  Final   Gram Stain   Final    ABUNDANT WBC PRESENT, PREDOMINANTLY MONONUCLEAR NO ORGANISMS SEEN    Culture   Final    NO GROWTH 3 DAYS Performed at [facility], [place] [address] [place], [place] [address]    Report Status 04/06/2017 FINAL  Final      Radiology Studies: Dg  Chest Port 1 View  Result Date: 04/10/2017 CLINICAL DATA:  Short of breath, history of recurrent pleural effusion EXAM: PORTABLE CHEST 1 VIEW COMPARISON:  Portable chest x-ray of 04/07/2017, and CT chest of 03/30/2017 FINDINGS: There has been interval improvement in the lower lobe, right middle lobe, and lingular pneumonia described on prior CT of the chest. There may be very small pleural effusions present but no significant pleural effusion is evident. Slightly more prominent interstitial markings are present in the perihilar regions left-greater-than-right and mild fluid overload/mild edema cannot be excluded. Correlate  clinically. Mediastinal and hilar contours are unremarkable and mild cardiomegaly is stable. IMPRESSION: 1. Improvement in pneumonia at the lung bases as described above. 2. Cannot exclude small pleural effusions but no significant pleural effusion is evident. 3. Somewhat more prominent interstitial markings primarily perihilar left-greater-than-right. Question mild fluid overload/edema. Electronically Signed   By: Ivar Drape M.D.   On: 04/10/2017 13:48     Medications:  Scheduled: . budesonide (PULMICORT) nebulizer solution  0.25 mg Nebulization BID  . docusate sodium  100 mg Oral BID  . fentaNYL  25 mcg Transdermal Q72H  . fluconazole  100 mg Oral Daily  . furosemide  40 mg Intravenous Q12H  . HYDROmorphone   Intravenous Q4H  . magic mouthwash  10 mL Oral TID  . pantoprazole  40 mg Oral Daily  . polyethylene glycol  17 g Oral Daily  . potassium chloride  40 mEq Oral Daily  . senna-docusate  1 tablet Oral BID  . sucralfate  1 g Oral TID WC & HS   Continuous: . albumin human     UUV:OZDGUYQIH, ALPRAZolam, benzonatate, diphenhydrAMINE, HYDROmorphone (DILAUDID) injection, iopamidol, magic mouthwash w/lidocaine, ondansetron (ZOFRAN) IV, prochlorperazine  Assessment/Plan:  Active Problems:   Kaposi's sarcoma (HCC)   Dyspnea   Pleuritic chest pain   Dysuria   Anemia   Thrombocytopenia (HCC)   Pulmonary embolism (HCC)   Kaposi's sarcoma of multiple organs (HCC)   Pleural effusion, malignant   Scrotal edema   Palliative care by specialist    Dyspnea associated with chest pain CT angiogram, VQ scan low probability for PE.  Echocardiogram with EF within normal limit.  PE ruled out. Pleural effusion cytology consistent with lymphoma. Pulmonary consult appreciated. Status post left thoracocentesis on 12/4 with removal of 1100 cc of fluid. Cultures negative and cytology with primary effusion lymphoma. Plan for Pleurx catheter by IR as a symptomatic management for dyspnea, palliation  treatment.  Due to lack of pleural fluid pleurx cannot be placed.  Bilateral lower extremities edema/scrotal edema Patient was treated with IV diuretics and albumin with no clinical improvement.  Discussed about scrotal elevation and support to elevate the pressure.  Palliative medicine is following and considering aspiration.  Acute kidney injury likely due to obstruction,? contrast associated Status post stent placement.  Currently on IV Lasix.  Serum creatinine level is stable.  Hyponatremia/ hypokalemia Hyponatremia most likely due to hypervolemia.  Currently on diuretics without much improvement.  Currently main goal is comfort care therefore we will not further check lab.  Palliative care following. Patient with poor prognosis.  Kaposi's sarcoma HIV negative.  Oncology follow-up ongoing.  Comfort measures and pain management.  Cancer pain Continue current pain management. Currently on Dilaudid PCA pump.  Palliative care consult appreciated.  Left hydroureteronephrosis Status post a stent placement by urology. Urologist recommended to continue Foley catheter for a month and outpatient follow-up. Patient has large scrotum with edema.  Scrotal support and IV  Lasix, no improvement.  Thrombocytopenia in the setting of chemotherapy and malignancy Monitor platelet count.  No sign of bleeding.    Anemia in the setting of chemo and malignancy/iron deficiency anemia Received  2 units of red blood cell transfusion on 12/6.  Received  a dose of Fereheme for iron deficiency.  Failure to thrive and generalized weakness Overall poor prognosis.    DVT Prophylaxis: SCD's    Code Status: DNR  Family Communication: No family at bedside.  Discussed with the patient. Disposition Plan: Management as outlined above.    LOS: 12 days   [facility] Pager [phone] 04/11/2017, 3:09 PM  If 7PM-7AM, please contact night-coverage at [email or web address], password [facility]

## 2017-04-11 NOTE — Progress Notes (Signed)
Rt leg is weeping a yellowish fluid with  scant amount of blood  There is the appearance of blisters forming under the back rt calf. He declines  To have his legs elevated

## 2017-04-11 NOTE — Care Management Note (Signed)
Case Management Note  Patient Details  Name: Loyd Marhefka MRN: [id number] Date of Birth: [birth year]  Subjective/Objective:                  resp distress  Action/Plan: Date: April 11, 2017 Velva Harman, BSN, [place], [place] Chart and notes review for patient progress and needs. Will follow for case management and discharge needs. Next review date: [id number]  Expected Discharge Date:  (unknown)               Expected Discharge Plan:  Home w Hospice Care  In-House Referral:     Discharge planning Services  CM Consult  Post Acute Care Choice:  Hospice Choice offered to:  Parent  DME Arranged:    DME Agency:     HH Arranged:  Disease Management [place] Agency:  [facility]  Status of Service:  In process, will continue to follow  If discussed at Long Length of Stay Meetings, dates discussed:    Additional Comments:  Leeroy Cha, RN 04/11/2017, 9:05 AM

## 2017-04-11 NOTE — Progress Notes (Signed)
Dr Alinda Money inserted 2 21g butterfly  needles in the scrotum .One in each testicle -.  At 1900 we have emptied 460 ml of straw colored fluid from the right testicle,.the area over the penis has softened  And edema has lessened. Small amount of drainage from the left testicle

## 2017-04-11 NOTE — Progress Notes (Signed)
Physical Therapy Discharge Patient Details Name: Victor Hayden MRN: [id number] DOB: July 29, 1970 Today's Date: 04/11/2017 Time:  -     Patient discharged from PT services secondary to medical decline - will need to re-order PT to resume therapy services.  Please see latest therapy progress note for current level of functioning and progress toward goals.    Plan is  Home with Hospice. GP     Marcelino Freestone PT [phone]  04/11/2017, 7:23 AM

## 2017-04-11 NOTE — Progress Notes (Signed)
[facility] Liaison: RN visit  Followed-up with patient re: discharging home with hospice.   Per discussion, plan remains for discharge to home by PTAR when medically appropriate.    Please send signed and completed DNR form home with patient/family. Patient will need prescriptions for discharge comfort medications.  Family waiting for DME to be delivered to the home.    [facility] aware of the above. Please notify [facility] when patient is ready to leave the unit at discharge. (Call [phone] or [phone] after 5pm.)   Please call with any hospice related questions.  Thank you,  Freddi Starr RN, [facility] Liaison [phone] ? Hospital liaisons are now on [place].

## 2017-04-11 NOTE — Progress Notes (Signed)
Victor Hayden ID: Victor Hayden, male   DOB: 1970/05/19, 46 y.o.   MRN: [id number]  Victor Hayden is a 46 year old Victor Hayden who has been seen by Dr. Louis Meckel during this admission for left hydronephrosis.  Victor Hayden underwent left ureteral stent placement approximately 1 week ago.  Victor Hayden has advanced Kaposi's sarcoma and the decision has now been made to proceed with palliative hospice care.  Victor Hayden apparently has been developing worsening scrotal and penile edema related to Victor Hayden generalized anasarca.  Despite scrotal elevation, this has been problematic and is causing Victor Hayden significant discomfort.  Most of Victor Hayden pain is on the left side raising the question of whether Victor Hayden pain may be related to Victor Hayden ureteral stent with pain radiating to the testicle.   On exam, Victor Hayden scrotum is extremely tense and distended as well as edematous.  Even Victor Hayden penile skin is quite tense in addition to the edema noted.  This is somewhat tender to touch, worse on the left compared to the right. There is no sign of any active infection or drainage from the skin.  Considering the palliative course Victor Hayden has now chosen, efforts have been made simply to provide Victor Hayden pain relief.  I did speak with Dr. Hilma Favors earlier today Victor Hayden brought to my attention a single case report regarding scrotalcentesis.  Although the data is limited to a single case report in the literature, I did discuss this with the Victor Hayden.  After discussion regarding the potential risks including infection, worsening pain, and the likelihood that this would be a very temporary measure, Victor Hayden did give consent and felt willing to try this procedure if it would provide Victor Hayden any relief.  Considering the very low risk involved, I felt that this was a reasonable option considering Victor Hayden overall situation.  The scrotum was prepped with Betadine both on the right and left hemiscrotum.  I placed a 21-gauge butterfly needle into the subcutaneous tissues on the left and right scrotal subcutaneous tissues.  These were  then left to drain by gravity.  It appeared to be draining serous, clear fluid.  These drains were left in place and monitored.  The output was recorded by the nursing staff and after 3 hours of drainage, there was close to 500 cc of serous fluid drained.  The Victor Hayden did have relief in the scrotum and penile skin remained quite edematous although the skin was no longer tense.  Considering the relief gained by the Victor Hayden, the needles were then removed without complications.  Victor Hayden was instructed that this edema is likely to recur and to continue to try to keep the scrotum elevated.  Victor Hayden expressed Victor Hayden gratitude at getting temporary relief.

## 2017-04-11 NOTE — Progress Notes (Signed)
Followed up with Audelia Acton and plan today is possible Pluerx placement. His biggest complaint is scrotal pain and swelling, he has severe lower body lymphedema- probably related to his cancer and to low albumin state.Hypotension today.  His appetite has improved and he is drinking fluids, Plan is to go home with hospice care.  I asked IR to consider Scrotal-centesis, there are case reports in palliative literature about this being and effective way to help with the pain associated with this condition. May need to consult urology for this consideration- this is a hospice situation and at this point it is causing him extreme discomfort and this may be a potential way to help.  Tolerating Duragesic. Will transition to oral dilaudid tomorrow since he is currently NPO for possible procedure.  Time: 35 min Greater than 50%  of this time was spent counseling and coordinating care related to the above assessment and plan.  Lane Hacker, DO Palliative Medicine [phone]

## 2017-04-12 MED ORDER — POLYETHYLENE GLYCOL 3350 17 G PO PACK: 17.0000 g | PACK | Freq: Every day | ORAL | 0 refills | Status: AC

## 2017-04-12 MED ORDER — DOCUSATE SODIUM 100 MG PO CAPS: 100.0000 mg | ORAL_CAPSULE | Freq: Two times a day (BID) | ORAL | 0 refills | Status: AC

## 2017-04-12 MED ORDER — HYDROMORPHONE HCL 4 MG PO TABS: 4.0000 mg | ORAL_TABLET | ORAL | Status: DC | PRN

## 2017-04-12 MED ORDER — FENTANYL 50 MCG/HR TD PT72: 50.0000 ug | MEDICATED_PATCH | TRANSDERMAL | 0 refills | Status: AC

## 2017-04-12 MED ORDER — SENNOSIDES-DOCUSATE SODIUM 8.6-50 MG PO TABS: 1.0000 | ORAL_TABLET | Freq: Two times a day (BID) | ORAL | 0 refills | Status: AC

## 2017-04-12 MED ORDER — HYDROMORPHONE HCL 4 MG PO TABS: 4.0000 mg | ORAL_TABLET | ORAL | 0 refills | Status: AC | PRN

## 2017-04-12 MED ORDER — FENTANYL 50 MCG/HR TD PT72
50.0000 ug | MEDICATED_PATCH | TRANSDERMAL | Status: DC
  Administered 2017-04-12: 50 ug via TRANSDERMAL
  Filled 2017-04-12: qty 1

## 2017-04-12 MED ORDER — HYDROMORPHONE HCL 1 MG/ML IJ SOLN: 1.0000 mg | INTRAMUSCULAR | Status: DC | PRN

## 2017-04-12 MED FILL — HYDROmorphone HCL 4 MG TABS: 4 | 2 days supply | Qty: 30 | Fill #0

## 2017-04-12 MED FILL — fentaNYL 50 MCG/HR PT72: 50 | 10 days supply | Qty: 5 | Fill #0

## 2017-04-12 NOTE — Discharge Summary (Signed)
[facility]  Physician Discharge Summary   Patient ID: Victor Hayden MRN: [id number] DOB/AGE: February 08, 1971 46 y.o.  Admit date: 03/30/2017 Discharge date: 04/12/2017  PCP: Mackie Pai, PA-C  DISCHARGE DIAGNOSES:  Active Problems:   Kaposi's sarcoma ([place])   Dyspnea   Pleuritic chest pain   Dysuria   Anemia   Thrombocytopenia (HCC)   Pulmonary embolism (HCC)   Kaposi's sarcoma of multiple organs (HCC)   Pleural effusion, malignant   Scrotal edema   Palliative care by specialist   RECOMMENDATIONS FOR OUTPATIENT FOLLOW UP: 1. Patient to go home with hospice  DISCHARGE CONDITION: guarded  Diet recommendation: As before  [facility] Weights   03/30/17 1925  Weight: 93 kg (205 lb)    INITIAL HISTORY: 46 year old Caucasian male with past medical history of rapidly worsening widespread Kaposi sarcoma presented with shortness of breath, chest pain, anasarca.  CT angiogram negative for PE.  Pleural effusion was noted.  Patient underwent thoracentesis.  Palliative medicine was consulted.  Pain control.  Consultants: Oncology.  Palliative medicine.  Urology.  Interventional radiology.  Procedures:  Thoracentesis  Ureteral stent placement  Scrotalcentesis for palliation   HOSPITAL COURSE:    Dyspnea associated with chest pain CT angiogram and VQ scan low probability for PE. Echocardiogram with EF within normal limit. PE ruled out. Pleural effusion cytology consistent with lymphoma. Pulmonary consult appreciated. Status post left thoracocentesis on 12/4 with removal of 1100 cc of fluid. Cultures negative and cytology with primary effusion lymphoma.Plan was for Pleurx catheter placement by IR as a symptomatic management for dyspnea, palliation treatment.  Due to lack of pleural fluid pleurx cannot be placed.  Bilateral lower extremities edema/scrotal edema Patient was treated with IV diuretics and albumin with no clinical improvement. Discussed about  scrotal elevation and support to elevate the pressure.  Palliative medicine is following. Urology performed scrotalcentesis mainly for comfort.  He did experience some relief.  However he has reaccumulated some of the fluid.    Acute kidney injury likely due to obstruction,? contrast associated Status post stent placement.  Hyponatremia/ hypokalemia Hyponatremia most likely due to hypervolemia.  Patient with poor prognosis.  Kaposi's sarcoma HIV negative. Oncology follow-up ongoing. Comfort measures and pain management.  Cancer pain This was managed by palliative medicine.    Left hydroureteronephrosis Status post a stent placement by urology. Urologist recommended to continue Foley catheter for a month and outpatient follow-up. Patient has large scrotum with edema. See above.  Thrombocytopenia in the setting of chemotherapy and malignancy No sign of bleeding.   Anemia in the setting of chemo and malignancy/iron deficiency anemia Received 2 units of red blood cell transfusion on 12/6. Received a dose of Fereheme for iron deficiency.  Failure to thrive and generalized weakness Overall poor prognosis.  Prognosis is very poor.  Patient wants to go home to be with his family and friends.  Discharge recommended by palliative medicine.    PERTINENT LABS:  The results of significant diagnostics from this hospitalization (including imaging, microbiology, ancillary and laboratory) are listed below for reference.    Microbiology: Recent Results (from the past 240 hour(s))  Body fluid culture     Status: None   Collection Time: 04/03/17 12:06 PM  Result Value Ref Range Status   Specimen Description PLEURAL  Final   Special Requests NONE  Final   Gram Stain   Final    ABUNDANT WBC PRESENT, PREDOMINANTLY MONONUCLEAR NO ORGANISMS SEEN    Culture   Final    NO  GROWTH 3 DAYS Performed at [facility], [place] [address], [place], [place] [address]    Report  Status 04/06/2017 FINAL  Final     Labs: Basic Metabolic Panel: Recent Labs  Lab 04/06/17 0359 04/07/17 0404 04/09/17 0348  NA 128* 126* 122*  K 3.3* 3.9 4.4  CL 96* 91* 89*  CO2 26 26 25   GLUCOSE 139* 123* 128*  BUN 29* 34* 39*  CREATININE 1.35* 1.31* 1.43*  CALCIUM 7.7* 7.7* 7.5*  PHOS 4.3 4.9*  --    Liver Function Tests: Recent Labs  Lab 04/06/17 0359 04/07/17 0404  ALBUMIN 2.3* 2.2*   CBC: Recent Labs  Lab 04/06/17 0359 04/07/17 0404 04/09/17 0348  WBC 4.6 6.2 7.6  HGB 8.4* 8.8* 8.0*  HCT 24.9* 26.3* 24.0*  MCV 83.0 83.5 84.2  PLT 58* 52* 50*   BNP: BNP (last 3 results) Recent Labs    03/30/17 2014  BNP 30.6     IMAGING STUDIES Dg Chest 2 View  Result Date: 04/01/2017 CLINICAL DATA:  Shortness of breath. Dry cough. Right-sided chest pain. History of Kaposi's sarcoma. EXAM: CHEST  2 VIEW COMPARISON:  Chest CT March 30, 2017 and chest x-ray March 30, 2017 FINDINGS: No pneumothorax. Mild cardiomegaly. The hila and mediastinum are unchanged. The bilateral pulmonary opacities and small effusions are unchanged since March 30, 2017. The findings are nonspecific on this chest x-ray there were better characterized on the recent chest CT. IMPRESSION: No interval change in bilateral pulmonary opacities or small effusions. Electronically Signed   By: Dorise Bullion III M.D   On: 04/01/2017 14:52   Dg Chest 2 View  Result Date: 03/21/2017 CLINICAL DATA:  Shortness of breath.  History of Kaposi's sarcoma EXAM: CHEST  2 VIEW COMPARISON:  Chest radiograph March 01, 2017; chest CT February 07, 2017 FINDINGS: There is a small right pleural effusion. There is diffuse interstitial thickening, likely interstitial edema. There is no airspace consolidation or appreciable volume loss. Heart is upper normal in size with pulmonary vascularity within normal limits. No adenopathy. No evident bone lesions. IMPRESSION: Small right pleural effusion. Fairly diffuse  interstitial thickening and prominence, increased from most recent study. This appearance may be indicative of atypical pneumonia or noncardiogenic edema. An allergic type response may present in this manner. Lymphangitic spread of tumor can present in this manner, although reported via change compared to most recent study makes lymphangitic spread of tumor somewhat less likely. There is no frank airspace consolidation. No appreciable adenopathy. Stable cardiac silhouette. Electronically Signed   By: Lowella Grip III M.D.   On: 03/21/2017 13:19   Ct Angio Chest Pe W And/or Wo Contrast  Result Date: 03/30/2017 CLINICAL DATA:  Chronic lower leg soft-tissue necrosis. Shortness of breath. EXAM: CT ANGIOGRAPHY CHEST WITH CONTRAST TECHNIQUE: Multidetector CT imaging of the chest was performed using the standard protocol during bolus administration of intravenous contrast. Multiplanar CT image reconstructions and MIPs were obtained to evaluate the vascular anatomy. CONTRAST:  80 mL of Isovue 370 COMPARISON:  Chest x-ray from earlier today. Chest CT February 07, 2017. FINDINGS: Cardiovascular: The thoracic aorta is normal in caliber with no dissection. The heart size is normal. The coronary arteries are unremarkable. Evaluation of the pulmonary arteries is severely limited due to respiratory motion, artifact, and timing of the contrast bolus. However, I am suspicious there are emboli such as on coronal image 68 in the left upper lobe. Numerous other regions of decreased attenuation are seen in the pulmonary  arteries. I suspect much of this is artifact but findings are also concerning for additional emboli. Another possible embolus is seen on the right on coronal image 72. Mediastinum/Nodes: Mild nodularity in the thyroid. The esophagus is unremarkable. The increased soft tissue in stranding in the anterior and middle mediastinum is similar in the interval. There is a prominent right paratracheal node on series 4,  image 27 measuring 12 mm. No hilar adenopathy. There is increasing fat stranding in the bilateral axilla, left greater than right with numerous shotty nodes which have increased. Subcutaneous edema seen in the chest, well seen on the right on image 69. A small pericardial effusion is identified. Lungs/Pleura: The central airways are normal. No pneumothorax. Bilateral pulmonary infiltrates are identified presenting primarily as ill defined alveolar and nodular opacities. A nodular component on series 11, image 38 measure up to 14 mm. Mild interlobular septal thickening is seen in some regions. Left greater than right pleural effusions are seen. Opacities associated with the effusions may simply represent compressive atelectasis. Upper Abdomen: There is increased attenuation in the fat in the gastrohepatic ligament and periceliac region. Mild caliectasis in the upper pole the left kidney. No other abnormalities in the upper abdomen. Musculoskeletal: The bones are stable. Lucency in the left T9 vertebral body is stable. Review of the MIP images confirms the above findings. IMPRESSION: 1. Evaluation for pulmonary emboli is severely limited as above. However, findings are suspicious but not definitive for emboli as above. 2. The ill defined alveolar and nodular opacities with bilateral effusions and some interlobular septal thickening is nonspecific. Kaposi's sarcoma involving the lungs could have this appearance. However, infectious processes are also possible. 3. Extensive soft tissue stranding throughout the anterior and middle mediastinum is stable. There is a similar process in the bilateral axilla and upper abdomen which is also new. The underlying etiology of these findings is unclear. Volume overload is a possibility. A process secondary to the patient's Kaposi sarcoma is not excluded but the findings are unusual. 4. Caliectasis in the upper pole left kidney is incompletely evaluated. Findings discussed with Dr.  Roderic Palau Electronically Signed   By: Dorise Bullion III M.D   On: 03/30/2017 22:36   US Scrotum  Result Date: 04/02/2017 CLINICAL DATA:  Scrotal edema.  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 EXAM: SCROTAL ULTRASOUND DOPPLER ULTRASOUND OF THE TESTICLES TECHNIQUE: Complete ultrasound examination of the testicles, epididymis, and other scrotal structures was performed. Color and spectral Doppler ultrasound were also utilized to evaluate blood flow to the testicles. COMPARISON:  None. FINDINGS: Right testicle Measurements: 4.3 x 2.1 x 2.5 cm. 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 Left testicle Measurements: 3.9 x 2 x 2.3 cm. 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 Right epididymis: Normal in size and appearance to the extent visualized. The right epididymal head was not optimally visualized. Left epididymis:  Normal in size and appearance. Hydrocele:  None visualized. Varicocele:  None visualized. Pulsed Doppler interrogation of both testes demonstrates normal low resistance arterial and venous waveforms bilaterally. Diffuse scrotal edema, nonspecific in etiology. IMPRESSION: 1. 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 Current literature suggests that testicular microlithiasis is not a significant independent risk factor for development of testicular carcinoma, and that follow up imaging is not warranted in the absence of other risk factors. Monthly testicular self-examination and annual physical exams are considered appropriate surveillance. If patient has other risk factors for testicular carcinoma, then referral to Urology should be considered. (Reference: Leretha Pol, et al.: A 5-Year Follow up Study of  Asymptomatic Men with Testicular Microlithiasis. J Urol 2008; 578:4696-2952.) 2. No torsion of either testicle. 3. Nonspecific scrotal edema. Electronically Signed   By: Ashley Royalty M.D.   On: 04/02/2017 19:51   Ct Abdomen Pelvis W Contrast  Result Date:  04/02/2017 CLINICAL DATA:  Scrotal swelling with generalized body aches and swelling. History of Kaposi's sarcoma. EXAM: CT ABDOMEN AND PELVIS WITH CONTRAST TECHNIQUE: Multidetector CT imaging of the abdomen and pelvis was performed using the standard protocol following bolus administration of intravenous contrast. CONTRAST:  16mL ISOVUE-300 IOPAMIDOL (ISOVUE-300) INJECTION 61% COMPARISON:  CT chest 03/30/2017.  Abdomen and pelvis CT 09/27/2016. FINDINGS: Lower chest: Heart size normal. Small to moderate left and small right pleural effusions. Central peribronchovascular airspace disease with bilateral pleural nodularity. Hepatobiliary: No focal abnormality within the liver parenchyma. Gallbladder is distended but otherwise unremarkable. No intrahepatic or extrahepatic biliary dilation. Pancreas: No focal mass lesion. No dilatation of the main duct. No intraparenchymal cyst. No peripancreatic edema. Spleen: No splenomegaly. No focal mass lesion. Adrenals/Urinary Tract: No adrenal nodule or mass. Right kidney and ureter unremarkable. Mild to moderate left hydroureteronephrosis identified with decreased perfusion to the left kidney compatible with obstructive uropathy. Left ureter is distended to the level of the aortic bifurcation with no hydroureter distal to that location. Bladder is decompressed. Stomach/Bowel: Stomach is nondistended. No gastric wall thickening. No evidence of outlet obstruction. Duodenum is normally positioned as is the ligament of Treitz. No small bowel wall thickening. No small bowel dilatation. The terminal ileum is normal. The appendix is normal. No gross colonic mass. No colonic wall thickening. No substantial diverticular change. Vascular/Lymphatic: No abdominal aortic aneurysm. No abdominal aortic atherosclerotic calcification. Portal vein and superior mesenteric vein are patent. Celiac axis and SMA are patent. There is no gastrohepatic or hepatoduodenal ligament lymphadenopathy. No  intraperitoneal or retroperitoneal lymphadenopathy. Edema/ inflammation is seen in the retroperitoneal soft tissues extending from the celiac axis down into the extraperitoneal space along both pelvic sidewalls. The mild left para-aortic lymphadenopathy seen on the 09/27/2016 exam has been incorporated into this change today and is less evident. Reproductive: The prostate gland and seminal vesicles have normal imaging features. Other: No substantial intraperitoneal free fluid. Musculoskeletal: Fairly marked diffuse body wall edema is seen in the lower chest, abdomen, and pelvis. Skin thickening is associated over the pelvic regions. The edema tracks into the intramuscular spaces of the pelvis and proximal thighs. IMPRESSION: 1. Fairly marked diffuse body wall edema with fluid/edema tracking into the musculature of the upper thighs and pelvis. Associated skin thickening is noted over the pelvis. This is associated with retroperitoneal edema involving the abdomen and extraperitoneal pelvic sidewalls. 2. Mild to moderate left hydroureteronephrosis with decreased perfusion of the left kidney compatible with obstructive uropathy. The left ureter appears obstructed in its mid segment as it courses through the edematous retroperitoneal tissues, but no obstructing lesion is evident by CT. 3. Small to moderate bilateral pleural effusions with bilateral dependent lower lobe collapse/consolidation. Electronically Signed   By: Misty Stanley M.D.   On: 04/02/2017 19:23   Nm Pulmonary Perf And Vent  Result Date: 04/02/2017 CLINICAL DATA:  Shortness of breath, chest pain, dyspnea, history Kaposi's sarcoma, prior pulmonary embolism EXAM: NUCLEAR MEDICINE VENTILATION - PERFUSION LUNG SCAN TECHNIQUE: Ventilation images were obtained in multiple projections using inhaled aerosol Tc-41m DTPA. Perfusion images were obtained in multiple projections after intravenous injection of Tc-58m MAA. RADIOPHARMACEUTICALS:  30 mCi  Technetium-36m DTPA aerosol inhalation and 4.2 mCi Technetium-38m MAA IV COMPARISON:  None; correlation chest radiograph 04/01/2017 FINDINGS: Ventilation: Central airway deposition of aerosol. Swallowed aerosol within stomach. Minimally inhomogeneous aerosol distribution in RIGHT lung. Diminished ventilation at base of LEFT lower lobe. No additional focal segmental or subsegmental ventilatory defects. Perfusion: Normal perfusion. No segmental or subsegmental perfusion defects. IMPRESSION: Normal perfusion lung scan. Diminished ventilation LEFT lower lobe base. Electronically Signed   By: Lavonia Dana M.D.   On: 04/02/2017 13:53   US Venous Img Lower Bilateral  Result Date: 03/16/2017 CLINICAL DATA:  Being treated for Kaposi's sarcoma. Patient claims that chemotherapy results in leg swelling. Swelling is worsening with discomfort. EXAM: BILATERAL LOWER EXTREMITY VENOUS DOPPLER ULTRASOUND TECHNIQUE: Gray-scale sonography with graded compression, as well as color Doppler and duplex ultrasound were performed to evaluate the lower extremity deep venous systems from the level of the common femoral vein and including the common femoral, femoral, profunda femoral, popliteal and calf veins including the posterior tibial, peroneal and gastrocnemius veins when visible. The superficial great saphenous vein was also interrogated. Spectral Doppler was utilized to evaluate flow at rest and with distal augmentation maneuvers in the common femoral, femoral and popliteal veins. COMPARISON:  None. FINDINGS: Study is limited by diffuse bilateral lower extremity soft tissue edema. Characterization is further limited by skin hardness. RIGHT LOWER EXTREMITY Common Femoral Vein: No evidence of thrombus. Normal compressibility, respiratory phasicity and response to augmentation. Saphenofemoral Junction: No evidence of thrombus. Normal compressibility and flow on color Doppler imaging. Profunda Femoral Vein: No evidence of thrombus.  Limited visualization. Femoral Vein: No evidence of thrombus.  Limited visualization. Popliteal Vein: No evidence of thrombus. Normal compressibility, respiratory phasicity and response to augmentation. Calf Veins: No evidence of thrombus.  Limited visualization. Superficial Great Saphenous Vein: No evidence of thrombus. Normal compressibility. Venous Reflux:  None. Other Findings:  None. LEFT LOWER EXTREMITY Common Femoral Vein: No evidence of thrombus. Normal compressibility, respiratory phasicity and response to augmentation. Saphenofemoral Junction: No evidence of thrombus. Normal compressibility and flow on color Doppler imaging. Profunda Femoral Vein: No evidence of thrombus. Limited visualization. Femoral Vein: No evidence of thrombus.  Limited visualization. Popliteal Vein: No evidence of thrombus. Normal compressibility, respiratory phasicity and response to augmentation. Calf Veins: No evidence of thrombus.  Limited visualization. Superficial Great Saphenous Vein: No evidence of thrombus. Normal compressibility. Venous Reflux:  None. Other Findings:  None. IMPRESSION: 1. No evidence of deep venous thrombosis, bilateral lower extremities, with study limitations detailed above. 2. Extensive soft tissue edema throughout the bilateral lower extremities. No circumscribed fluid collection or abscess like collection identified. Electronically Signed   By: Franki Cabot M.D.   On: 03/16/2017 18:23   Dg Chest Port 1 View  Result Date: 04/10/2017 CLINICAL DATA:  Short of breath, history of recurrent pleural effusion EXAM: PORTABLE CHEST 1 VIEW COMPARISON:  Portable chest x-ray of 04/07/2017, and CT chest of 03/30/2017 FINDINGS: There has been interval improvement in the lower lobe, right middle lobe, and lingular pneumonia described on prior CT of the chest. There may be very small pleural effusions present but no significant pleural effusion is evident. Slightly more prominent interstitial markings are present  in the perihilar regions left-greater-than-right and mild fluid overload/mild edema cannot be excluded. Correlate clinically. Mediastinal and hilar contours are unremarkable and mild cardiomegaly is stable. IMPRESSION: 1. Improvement in pneumonia at the lung bases as described above. 2. Cannot exclude small pleural effusions but no significant pleural effusion is evident. 3. Somewhat more prominent interstitial markings primarily perihilar left-greater-than-right. Question mild fluid overload/edema. Electronically  Signed   By: Ivar Drape M.D.   On: 04/10/2017 13:48   Dg Chest Port 1 View  Result Date: 04/07/2017 CLINICAL DATA:  Followup pleural effusion. EXAM: PORTABLE CHEST 1 VIEW COMPARISON:  04/03/2017 FINDINGS: Heart size remains normal. Bilateral lower lobe pneumonia and volume loss persists and may be slightly worsened. Probable small amount of subpulmonic fluid. Upper lungs remain largely clear. No acute bone finding. IMPRESSION: Persistent and possibly slightly worsened bilateral lower lobe pneumonia and volume loss. Electronically Signed   By: Nelson Chimes M.D.   On: 04/07/2017 07:48   Dg Chest Port 1 View  Result Date: 04/03/2017 CLINICAL DATA:  Status post left-sided thoracentesis with removal of 1 L fluid. History of cap Celsius sarcoma. EXAM: PORTABLE CHEST 1 VIEW COMPARISON:  PA and lateral chest x-ray of April 01, 2017 FINDINGS: No postprocedure pneumothorax is observed on the left. There is atelectatic change at the left base. The volume of pleural fluid is radiographically less today. On the right basilar atelectasis or infiltrate persists. The heart is mildly enlarged. The pulmonary vascularity is not engorged. IMPRESSION: No postprocedure pneumothorax following left-sided thoracentesis. Electronically Signed   By: David  Martinique M.D.   On: 04/03/2017 12:04   Dg Chest Port 1 View  Result Date: 03/30/2017 CLINICAL DATA:  Marked weakness. Shortness of breath. Currently being treated  for Kaposi sarcoma. EXAM: PORTABLE CHEST 1 VIEW COMPARISON:  03/21/2017. FINDINGS: Stable enlarged cardiac silhouette. Mild increase in prominence of the interstitial markings in the lower lung zones with a stable small right pleural effusion and interval minimal left pleural effusion. Interval minimal nodularity at the right lung base laterally. Unremarkable bones. IMPRESSION: Mild worsening of changes of congestive heart failure. There is interval minimal nodularity at the right lung base, possibly representing small foci of infection. Electronically Signed   By: Claudie Revering M.D.   On: 03/30/2017 20:53   Dg C-arm 1-60 Min-no Report  Result Date: 04/04/2017 Fluoroscopy was utilized by the requesting physician.  No radiographic interpretation.   [place] Scrotom Doppler  Result Date: 04/02/2017 CLINICAL DATA:  Scrotal edema.  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 EXAM: SCROTAL ULTRASOUND DOPPLER ULTRASOUND OF THE TESTICLES TECHNIQUE: Complete ultrasound examination of the testicles, epididymis, and other scrotal structures was performed. Color and spectral Doppler ultrasound were also utilized to evaluate blood flow to the testicles. COMPARISON:  None. FINDINGS: Right testicle Measurements: 4.3 x 2.1 x 2.5 cm. 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 Left testicle Measurements: 3.9 x 2 x 2.3 cm. 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 Right epididymis: Normal in size and appearance to the extent visualized. The right epididymal head was not optimally visualized. Left epididymis:  Normal in size and appearance. Hydrocele:  None visualized. Varicocele:  None visualized. Pulsed Doppler interrogation of both testes demonstrates normal low resistance arterial and venous waveforms bilaterally. Diffuse scrotal edema, nonspecific in etiology. IMPRESSION: 1. 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 Current literature suggests that testicular microlithiasis is not a significant  independent risk factor for development of testicular carcinoma, and that follow up imaging is not warranted in the absence of other risk factors. Monthly testicular self-examination and annual physical exams are considered appropriate surveillance. If patient has other risk factors for testicular carcinoma, then referral to Urology should be considered. (Reference: DeCastro, et al.: A 5-Year Follow up Study of Asymptomatic Men with Testicular Microlithiasis. J Urol 2008; 409:8119-1478.) 2. No torsion of either testicle. 3. Nonspecific scrotal edema. Electronically Signed   By: Meredith Leeds.D.  On: 04/02/2017 19:51    DISCHARGE EXAMINATION: Vitals:   04/11/17 2009 04/11/17 2116 04/12/17 0419 04/12/17 0824  BP:  98/62 (!) 85/60   Pulse:  95 (!) 110   Resp:  16 18 16   Temp:  99 F (37.2 C) 99 F (37.2 C)   TempSrc:  Oral Oral   SpO2: 92% 94% 95% 95%  Weight:      Height:       General appearance: Ill-appearing male.  In no distress.  Somewhat lethargic this morning but easily arousable. Resp: clear to auscultation bilaterally Cardio: regular rate and rhythm, S1, S2 normal, no murmur, click, rub or gallop GI: Abdomen is distended.  Firm to palpation.  Nontender.   Significant scrotal edema noted.  DISPOSITION: Home with hospice    Allergies as of 04/12/2017      Reactions   Bee Venom Anaphylaxis      Medication List    STOP taking these medications   furosemide 20 MG tablet Commonly known as:  LASIX     TAKE these medications   albuterol 108 (90 Base) MCG/ACT inhaler Commonly known as:  PROVENTIL HFA;VENTOLIN HFA Inhale 2 puffs every 6 (six) hours as needed into the lungs for wheezing or shortness of breath.   beclomethasone 40 MCG/ACT inhaler Commonly known as:  QVAR Inhale 2 puffs into the lungs 2 (two) times daily.   benzonatate 100 MG capsule Commonly known as:  TESSALON Take 1 capsule (100 mg total) by mouth 3 (three) times daily as needed for cough.   docusate  sodium 100 MG capsule Commonly known as:  COLACE Take 1 capsule (100 mg total) by mouth 2 (two) times daily.   esomeprazole 20 MG capsule Commonly known as:  NEXIUM Take 20 mg daily at 12 noon by mouth.   fentaNYL 50 MCG/HR Commonly known as:  DURAGESIC - dosed mcg/hr Place 1 patch (50 mcg total) onto the skin every other day.   HYDROmorphone 4 MG tablet Commonly known as:  DILAUDID Take 1 tablet (4 mg total) by mouth every 2 (two) hours as needed for severe pain.   magic mouthwash w/lidocaine Soln Take 5 mLs by mouth 4 (four) times daily as needed for mouth pain.   nystatin cream Commonly known as:  MYCOSTATIN Apply 1 application topically 2 (two) times daily.   polyethylene glycol packet Commonly known as:  MIRALAX / GLYCOLAX Take 17 g by mouth daily. Start taking on:  108/10/18   prochlorperazine 10 MG tablet Commonly known as:  COMPAZINE Take 1 tablet (10 mg total) by mouth every 6 (six) hours as needed for nausea or vomiting.   senna-docusate 8.6-50 MG tablet Commonly known as:  Senokot-S Take 1 tablet by mouth 2 (two) times daily.   sucralfate 1 GM/10ML suspension Commonly known as:  CARAFATE Take 10 mLs (1 g total) by mouth 4 (four) times daily -  with meals and at bedtime.       TOTAL DISCHARGE TIME: 35 mins  [facility] Pager [phone]  04/12/2017, 2:41 PM

## 2017-04-12 NOTE — Progress Notes (Signed)
[facility] Liaison: RN visit  Follow up visit today. Patient is sleeping and does not appear in distress. Spoke with his mother, Jeani Hawking  by phone to check status of DME delivery. Due to weather and other factors the DME has not been delivered. Contacted [facility] and they will reschedule delivery for today.   Per discussion, plan is for discharge to home by [facility] when medically appropriate.    Please send signed and completed DNR form home with patient/family. Patient will need prescriptions for discharge comfort medications.  [facility] aware of the above. Please notify [facility] when patient is ready to leave the unit at discharge. (Call [phone] or [phone] after 5pm.) [facility] information and contact numbers given toLynn Cameronat time of visit.  Please call with any hospice related questions.  Thank you,  Farrel Gordon, RN, [facility] Liaison  [phone] liaisons are now on [place].

## 2017-04-12 NOTE — Progress Notes (Signed)
Pt will discharge home with [place] [place], when ready for discharge.

## 2017-04-12 NOTE — Progress Notes (Signed)
Victor Hayden is declining. Main goal is for him to get home. Appreciate urology decompressing Victor scrotal edema. I have expedited bed delivery. Requested hospice RN admit today-scripts for duragesic and hydromorphone sent electronically to [place]. Provided support and reassurance today to Victor Hayden and Victor Hayden.  Time: 25 minutes Greater than 50%  of this time was spent counseling and coordinating care related to the above assessment and plan.   Victor Hacker, DO Palliative Medicine [phone]

## 2017-04-12 NOTE — Progress Notes (Signed)
Spoke with pt's mother Lysbeth [facility] to confirm pt's address. Plan are to discharge home with Hospice, [facility] for transportation, staff RN is aware.

## 2017-04-12 NOTE — Discharge Instructions (Signed)
Hospice Introduction Hospice is a service that is designed to provide people who are terminally ill and their families with medical, spiritual, and psychological support. Its aim is to improve your quality of life by keeping you as alert and comfortable as possible. Who will be my providers when I begin hospice care? Hospice teams often include:  A nurse.  A doctor. The hospice doctor will be available for your care, but you can bring your regular doctor or nurse practitioner.  Social workers.  Religious leaders (such as a Clinical biochemist).  Trained volunteers.  What roles will providers play in my care? Hospice is performed by a team of health care professionals and volunteers who:  Help keep you comfortable: ? Hospice can be provided in your home or in a homelike setting. ? The hospice staff works with your family and friends to help meet your needs. ? You will enjoy the support of loved ones by receiving much of your basic care from family and friends.  Provide pain relief and manage your symptoms. The staff supply all necessary medicines and equipment.  Provide companionship when you are alone.  Allow you and your family to rest. They may do light housekeeping, prepare meals, and run errands.  Provide counseling. They will make sure your emotional, spiritual, and social needs and those of your family are being met.  Provide spiritual care: ? Spiritual care will be individualized to meet your needs and your family's needs. ? Spiritual care may involve:  Helping you look at what death means to you.  Helping you say goodbye to your family and friends.  Performing a specific religious ceremony or ritual.  When should hospice care begin? Most people who use hospice are believed to have fewer than 6 months to live.  Your family and health care providers can help you decide when hospice services should begin.  If your condition improves, you may discontinue the program.  What  should I consider before selecting a program? Most hospice programs are run by nonprofit, independent organizations. Some are affiliated with hospitals, nursing homes, or home health care agencies. Hospice programs can take place in the home or at a hospice center, hospital, or skilled nursing facility. When choosing a hospice program, ask the following questions:  What services are available to me?  What services will be offered to my loved ones?  How involved will my loved ones be?  How involved will my health care provider be?  Who makes up the hospice care team? How are they trained or screened?  How will my pain and symptoms be managed?  If my circumstances change, can the services be provided in a different setting, such as my home or in the hospital?  Is the program reviewed and licensed by the state or certified in some other way?  Where can I learn more about hospice? You can learn about existing hospice programs in your area from your health care providers. You can also read more about hospice online. The websites of the following organizations contain helpful information:  The [facility] and Palliative Care Organization [facility]).  The Hospice Association of America ([place]).  The [place].  The American Cancer Society (ACS).  Hospice Net.  This information is not intended to replace advice given to you by your health care provider. Make sure you discuss any questions you have with your health care provider. Document Released: 08/04/2003 Document Revised: 12/02/2015 Document Reviewed: 02/25/2013 Elsevier Interactive Patient Education  2017 [employer].

## 2017-04-13 ENCOUNTER — Telehealth: Payer: Self-pay | Admitting: *Deleted

## 2017-04-13 NOTE — Telephone Encounter (Signed)
"  Victor Hayden with Hospice calling to update Dr. Alen Blew.  Patient admitted to Hospice last night.  Being transferred to [facility] immediately.  He is actively dying.  Family totally overwhelmed with their responsibility of care in the home."

## 2017-04-17 ENCOUNTER — Telehealth: Payer: Self-pay | Admitting: *Deleted

## 2017-04-17 NOTE — Telephone Encounter (Signed)
Received Notification of patient's Admission to [facility]; forwarded to provider/SLS 12/18

## 2017-04-20 ENCOUNTER — Other Ambulatory Visit: Payer: Self-pay

## 2017-04-20 ENCOUNTER — Other Ambulatory Visit: Payer: Self-pay | Admitting: Nurse Practitioner

## 2017-05-01 DEATH — deceased

## 2018-10-11 IMAGING — CT CT ANGIO CHEST
2 of 6 series · 17 of 36 positions shown · IV contrast (isovue)
Comparison: Chest x-ray from earlier today. Chest CT February 07, 2017.

CLINICAL DATA: Chronic lower leg soft-tissue necrosis. Shortness of
breath.

EXAM:
CT ANGIOGRAPHY CHEST WITH CONTRAST
TECHNIQUE: Multidetector CT imaging of the chest was performed using the
standard protocol during bolus administration of intravenous
contrast. Multiplanar CT image reconstructions and MIPs were
obtained to evaluate the vascular anatomy.
CONTRAST:  80 mL of Isovue 370

[Series 5: coronal mpr · coronal · 0.57mm/px · 1 of 147 slices shown]
[im 74/147  mediastinal]
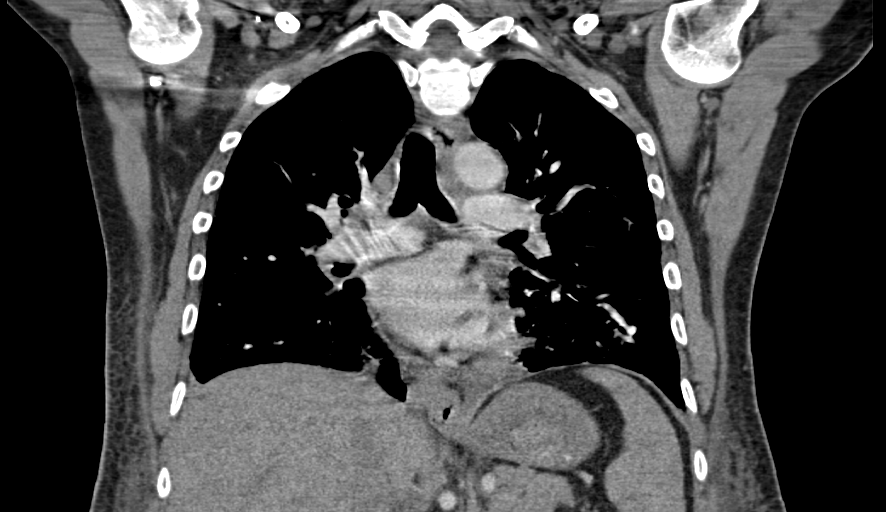

[Series 10: thins for pacs · axial · 0.74mm/px · z∈[-312,-47]mm · 16 of 295 slices shown]
[im 15/295  lung]
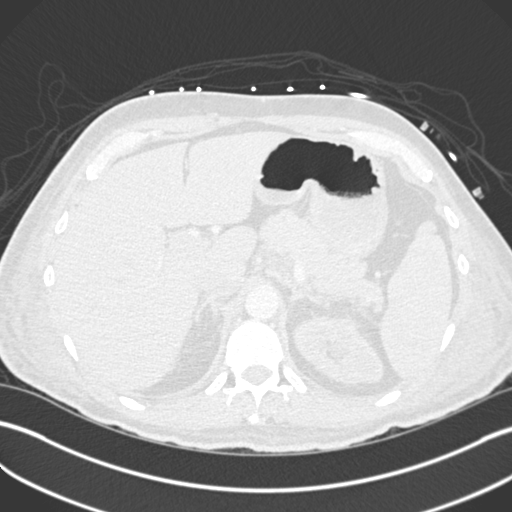
[im 30/295  mediastinal]
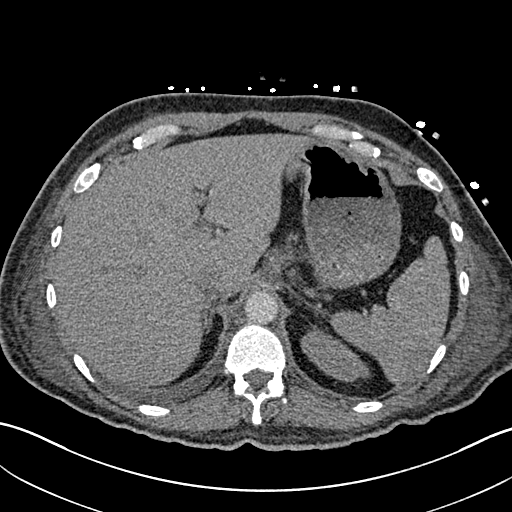
[im 45/295  lung]
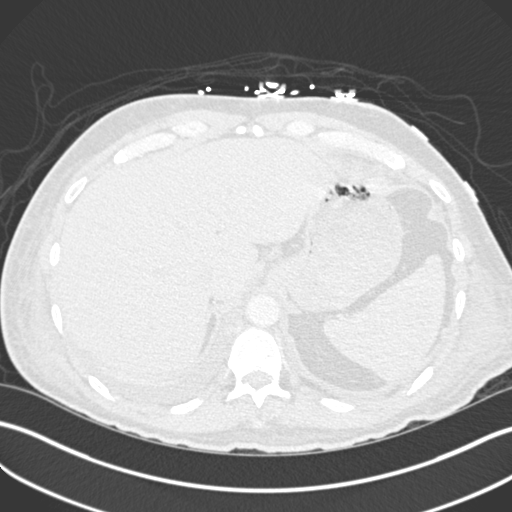
[im 74/295  mediastinal]
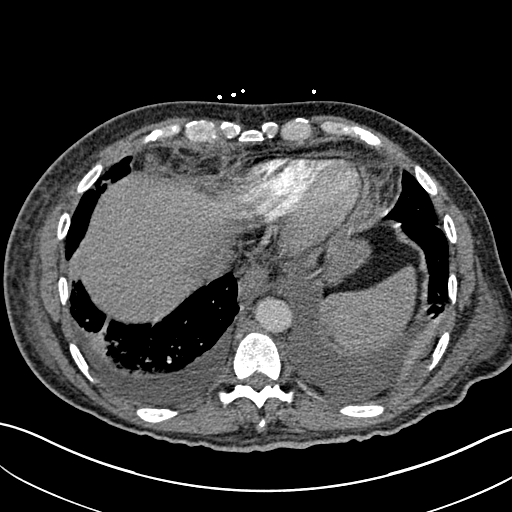
[im 89/295  lung]
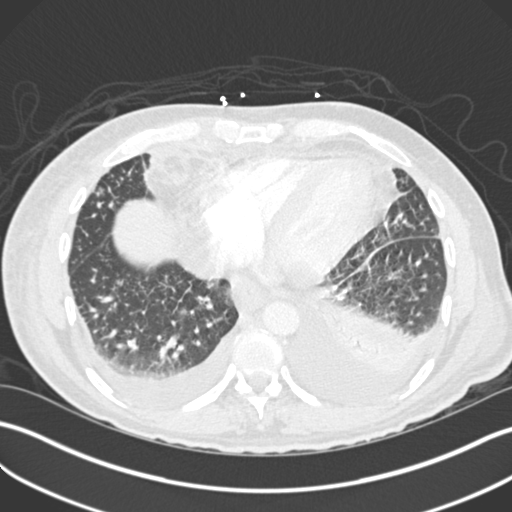
[im 103/295  mediastinal]
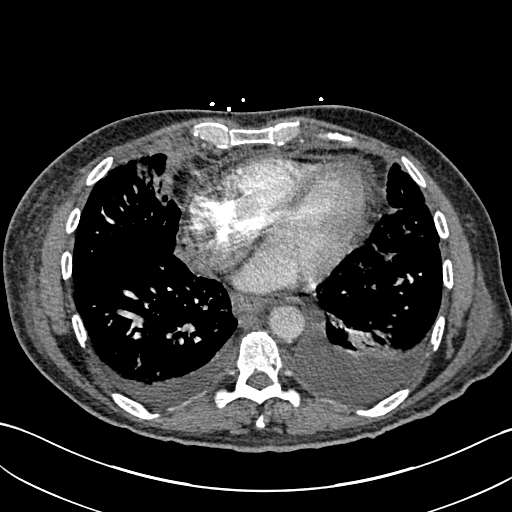
[im 118/295  lung]
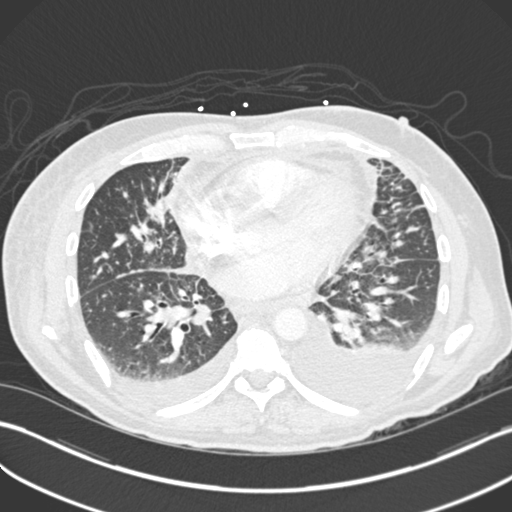
[im 133/295  mediastinal]
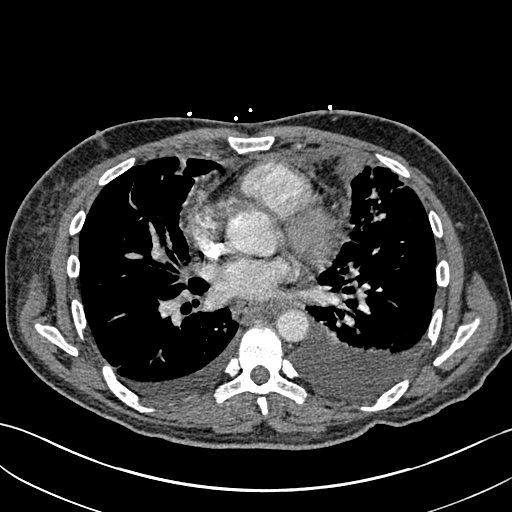
[im 162/295  lung]
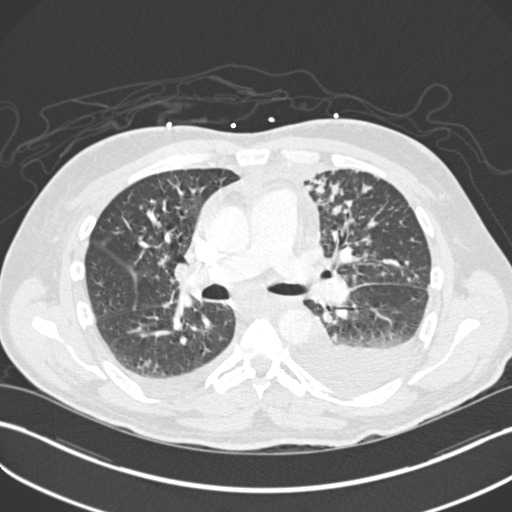
[im 177/295  mediastinal]
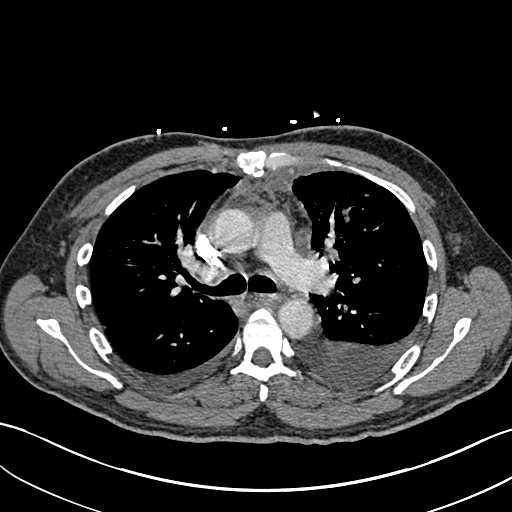
[im 192/295  lung]
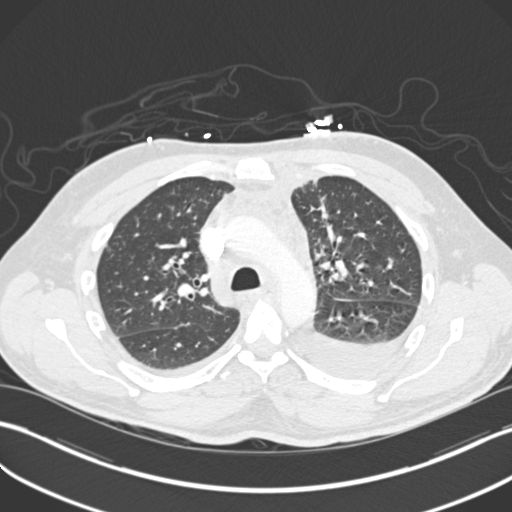
[im 206/295  mediastinal]
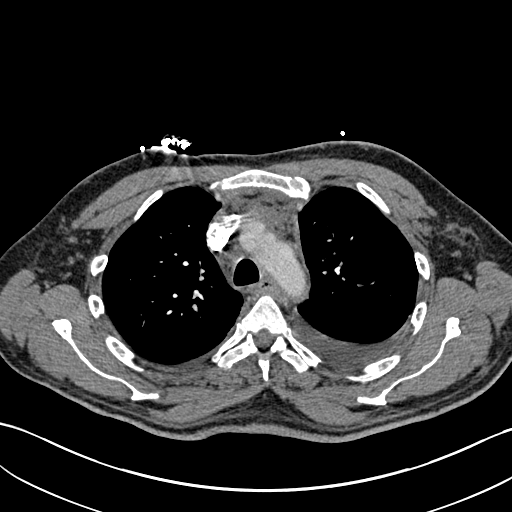
[im 221/295  lung]
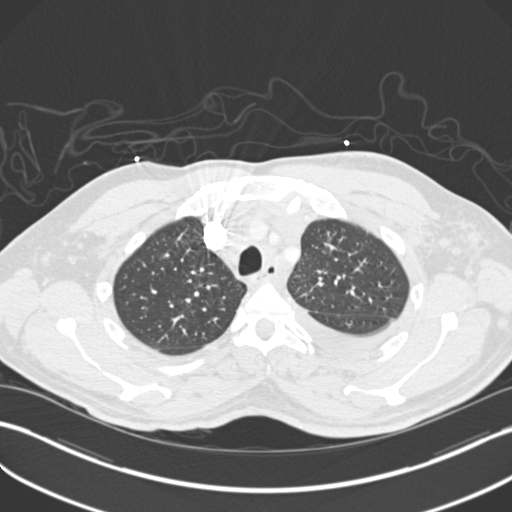
[im 250/295  mediastinal]
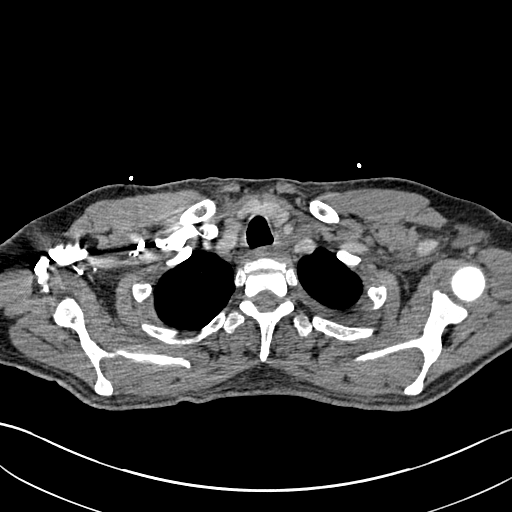
[im 265/295  lung]
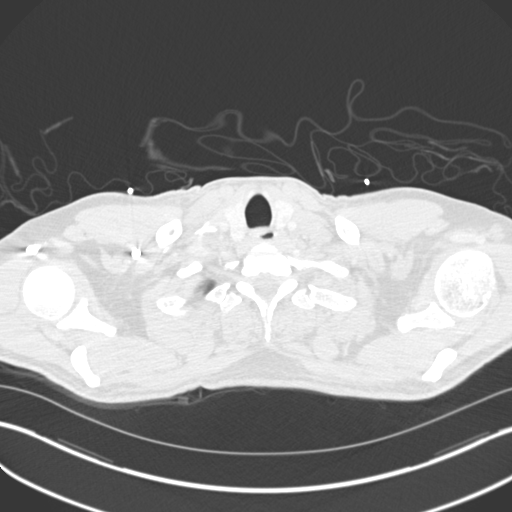
[im 280/295  mediastinal]
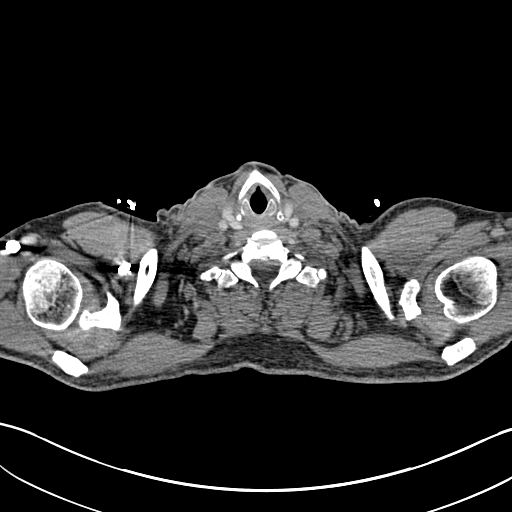

[17 of 36 positions shown; findings below may reference images not displayed]

FINDINGS: Cardiovascular: The thoracic aorta is normal in caliber with no
dissection. The heart size is normal. The coronary arteries are
unremarkable. Evaluation of the pulmonary arteries is severely
limited due to respiratory motion, artifact, and timing of the
contrast bolus. However, I am suspicious there are emboli such as on
coronal image 68 in the left upper lobe. Numerous other regions of
decreased attenuation are seen in the pulmonary arteries. I suspect
much of this is artifact but findings are also concerning for
additional emboli. Another possible embolus is seen on the right on
coronal image 72.

Mediastinum/Nodes: Mild nodularity in the thyroid. The esophagus is
unremarkable. The increased soft tissue in stranding in the anterior
and middle mediastinum is similar in the interval. There is a
prominent right paratracheal node on series 4, image 27 measuring 12
mm. No hilar adenopathy. There is increasing fat stranding in the
bilateral axilla, left greater than right with numerous shotty nodes
which have increased. Subcutaneous edema seen in the chest, well
seen on the right on image 69. A small pericardial effusion is
identified.

Lungs/Pleura: The central airways are normal. No pneumothorax.
Bilateral pulmonary infiltrates are identified presenting primarily
as ill defined alveolar and nodular opacities. A nodular component
on series 11, image 38 measure up to 14 mm. Mild interlobular septal
thickening is seen in some regions. Left greater than right pleural
effusions are seen. Opacities associated with the effusions may
simply represent compressive atelectasis.

Upper Abdomen: There is increased attenuation in the fat in the
gastrohepatic ligament and periceliac region. Mild caliectasis in
the upper pole the left kidney. No other abnormalities in the upper
abdomen.

Musculoskeletal: The bones are stable. Lucency in the left T9
vertebral body is stable.

Review of the MIP images confirms the above findings.
IMPRESSION: 1. Evaluation for pulmonary emboli is severely limited as above.
However, findings are suspicious but not definitive for emboli as
above.
2. The ill defined alveolar and nodular opacities with bilateral
effusions and some interlobular septal thickening is nonspecific.
Kaposi's sarcoma involving the lungs could have this appearance.
However, infectious processes are also possible.
3. Extensive soft tissue stranding throughout the anterior and
middle mediastinum is stable. There is a similar process in the
bilateral axilla and upper abdomen which is also new. The underlying
etiology of these findings is unclear. Volume overload is a
possibility. A process secondary to the patient's Kaposi sarcoma is
not excluded but the findings are unusual.
4. Caliectasis in the upper pole left kidney is incompletely
evaluated.
Findings discussed with Dr. Norys

## 2018-10-13 IMAGING — DX DG CHEST 2V
2 series · 2 of 2 positions shown · non-contrast
Comparison: Chest CT March 30, 2017 and chest x-ray March 30, 2017

CLINICAL DATA: Shortness of breath. Dry cough. Right-sided chest
pain. History of Kaposi's sarcoma.

EXAM:
CHEST  2 VIEW

[chest pa]
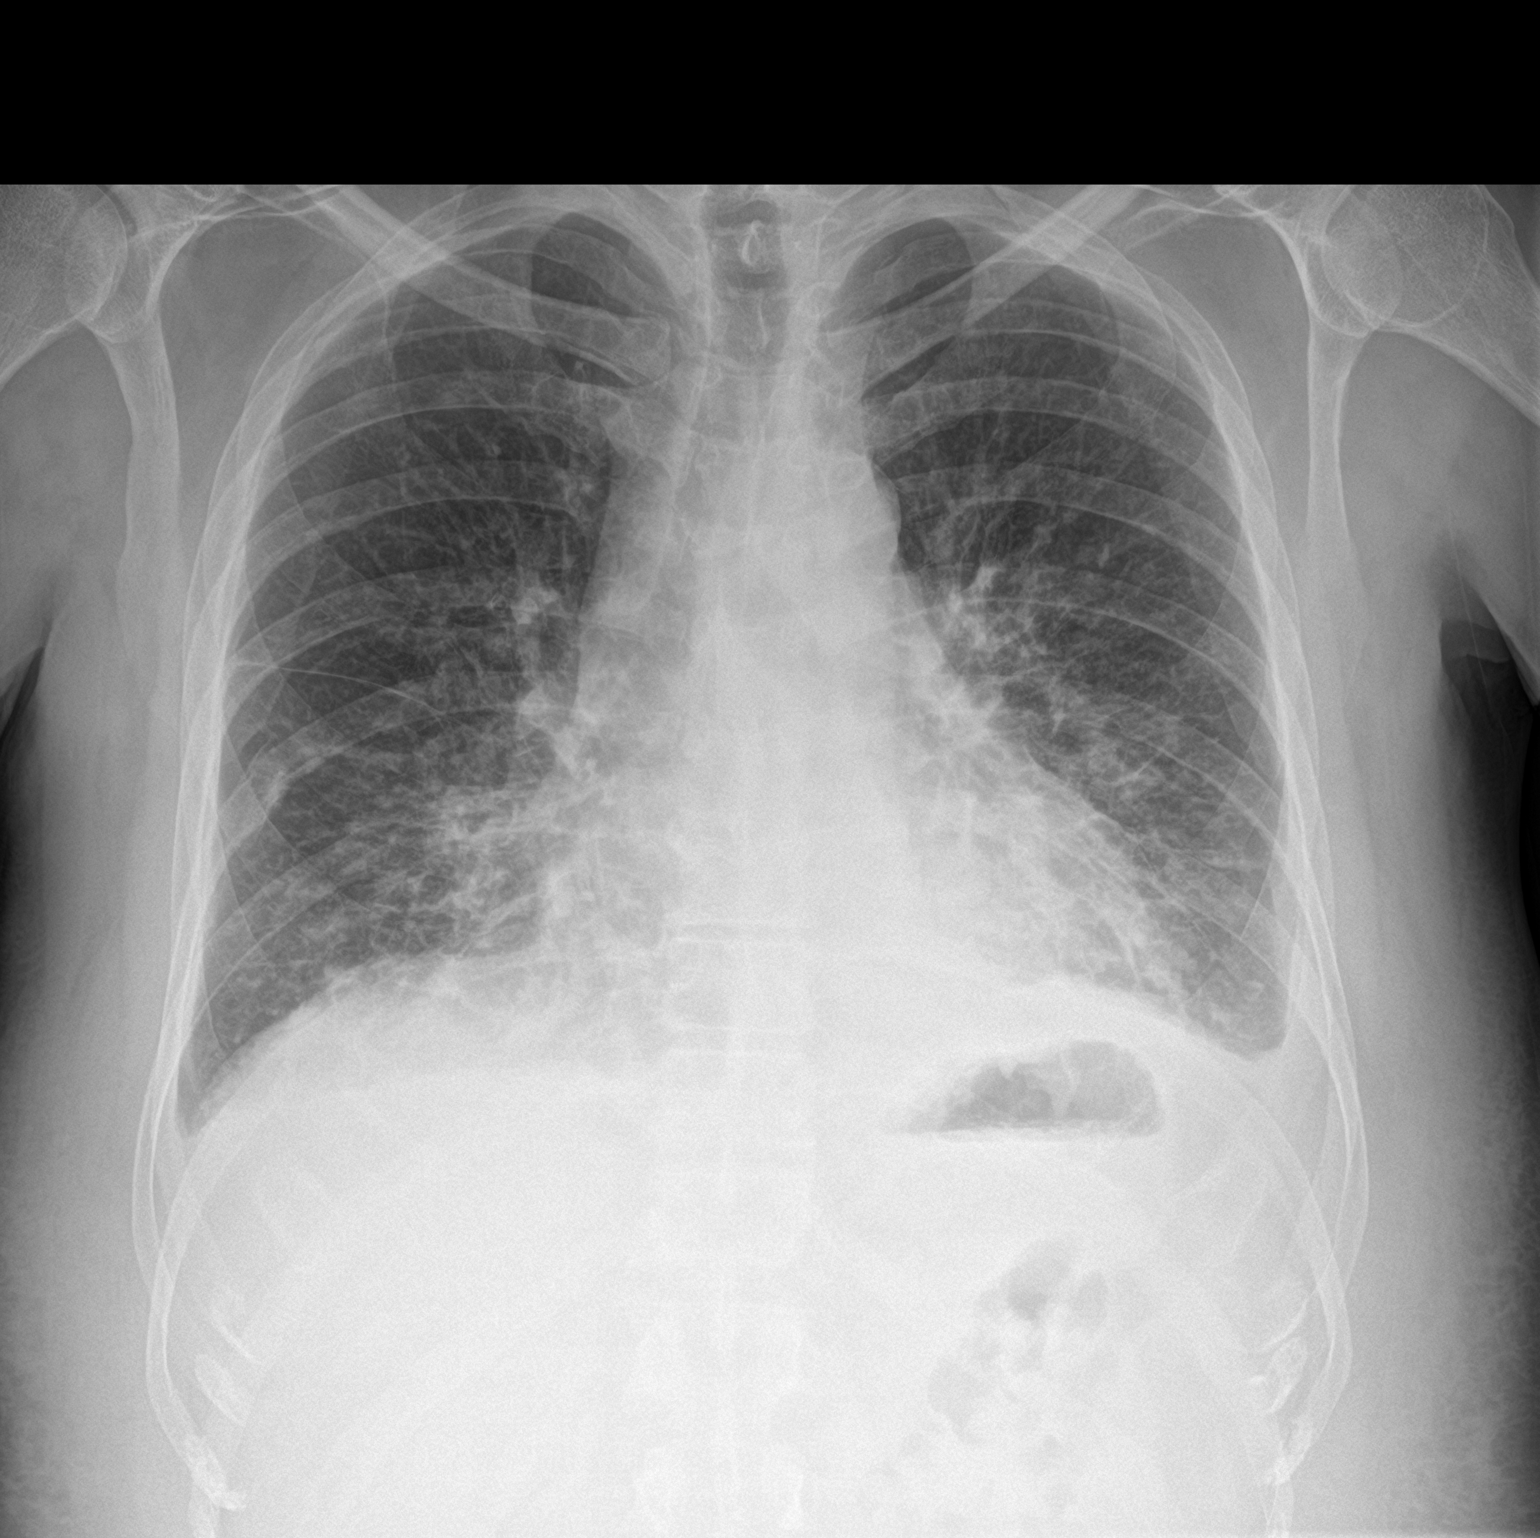

[chest lat]
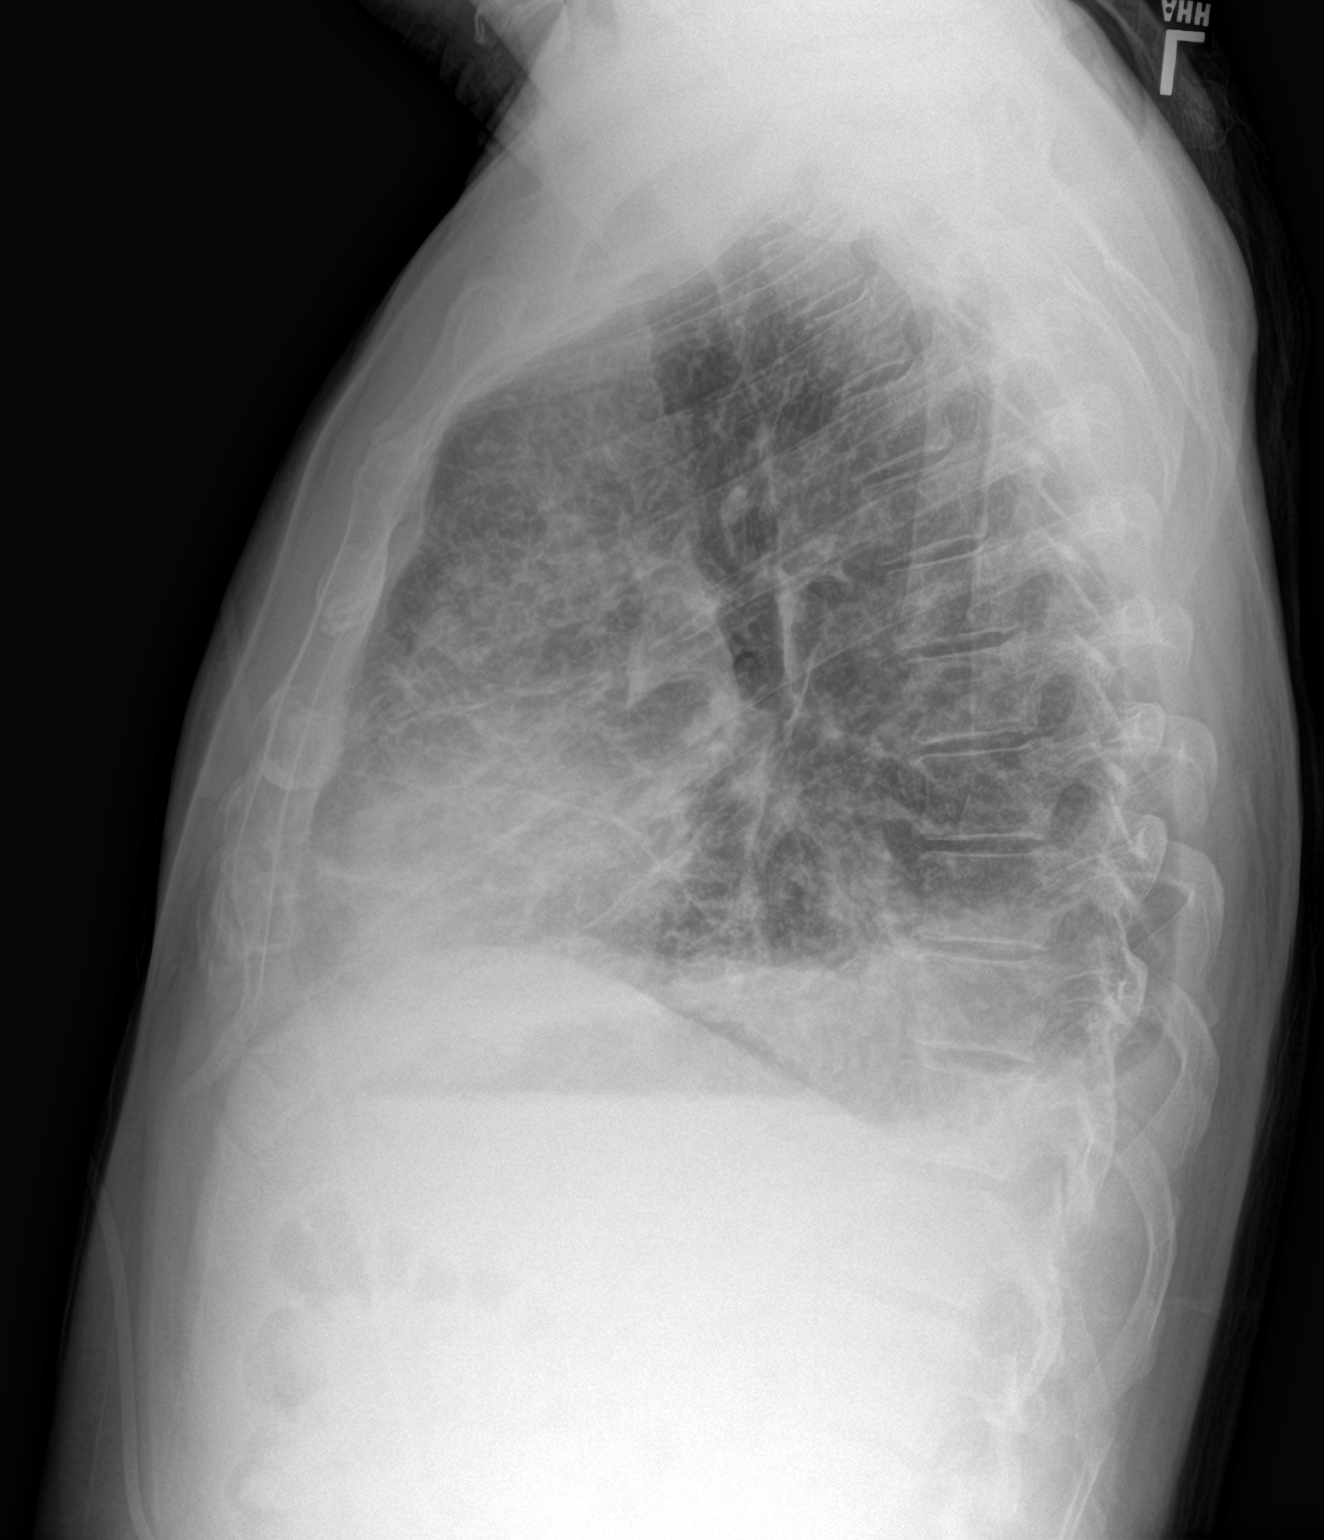

[2 of 2 positions shown; findings below may reference images not displayed]

FINDINGS: No pneumothorax. Mild cardiomegaly. The hila and mediastinum are
unchanged. The bilateral pulmonary opacities and small effusions are
unchanged since March 30, 2017. The findings are nonspecific on
this chest x-ray there were better characterized on the recent chest
CT.
IMPRESSION: No interval change in bilateral pulmonary opacities or small
effusions.

## 2018-10-19 IMAGING — DX DG CHEST 1V PORT
1 series · 1 of 1 positions shown · non-contrast
Comparison: 04/03/2017

CLINICAL DATA: Followup pleural effusion.

EXAM:
PORTABLE CHEST 1 VIEW

[chest ap]
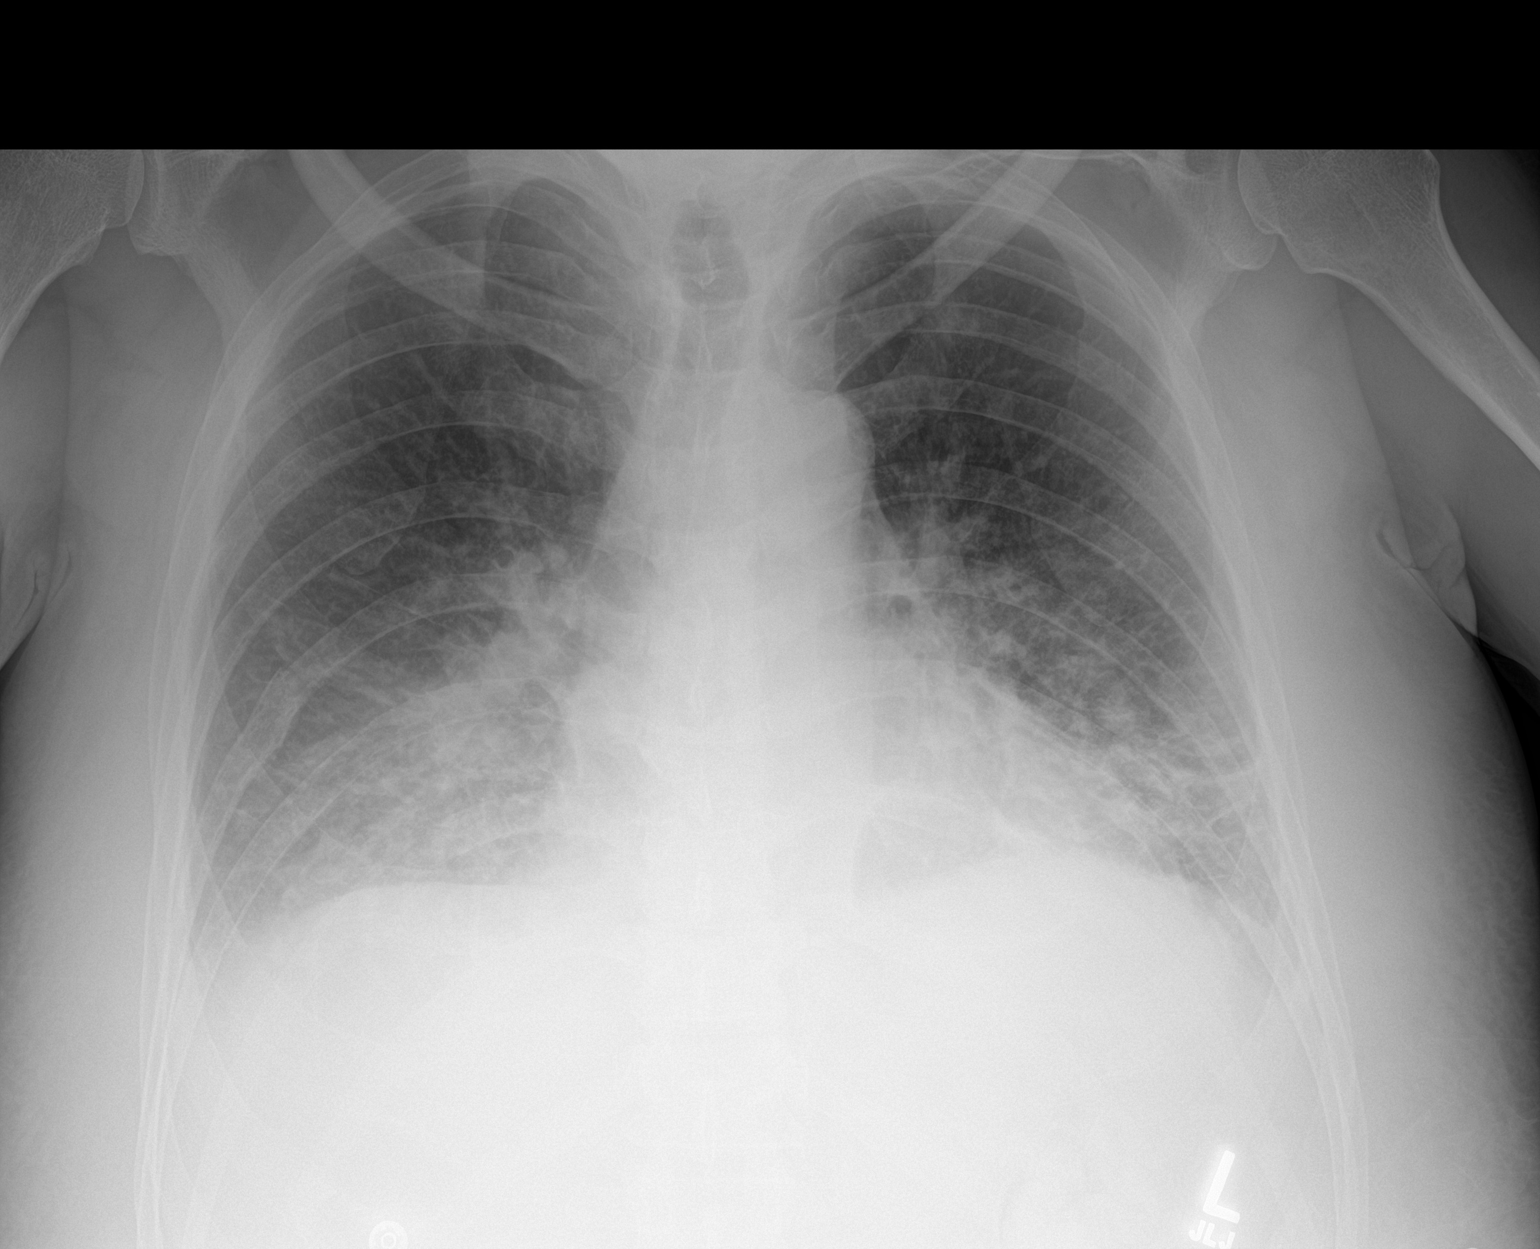

[1 of 1 positions shown; findings below may reference images not displayed]

FINDINGS: Heart size remains normal. Bilateral lower lobe pneumonia and volume
loss persists and may be slightly worsened. Probable small amount of
subpulmonic fluid. Upper lungs remain largely clear. No acute bone
finding.
IMPRESSION: Persistent and possibly slightly worsened bilateral lower lobe
pneumonia and volume loss.
